# Patient Record
Sex: Female | Born: 1950
Health system: Southern US, Community
[De-identification: ages and names within clinical notes are randomized; demographics above are authoritative.]

## PROBLEM LIST (undated history)

## (undated) DIAGNOSIS — M1990 Unspecified osteoarthritis, unspecified site: Secondary | ICD-10-CM

## (undated) DIAGNOSIS — Z860100 Personal history of colon polyps, unspecified: Secondary | ICD-10-CM

## (undated) DIAGNOSIS — D649 Anemia, unspecified: Secondary | ICD-10-CM

## (undated) DIAGNOSIS — I10 Essential (primary) hypertension: Secondary | ICD-10-CM

## (undated) HISTORY — DX: Personal history of colon polyps, unspecified: Z86.0100

## (undated) HISTORY — DX: Essential (primary) hypertension: I10

## (undated) HISTORY — DX: Personal history of colonic polyps: Z86.010

## (undated) HISTORY — DX: Anemia, unspecified: D64.9

---

## 1974-01-21 HISTORY — PX: TUBAL LIGATION: SHX77

## 1987-01-22 HISTORY — PX: CHOLECYSTECTOMY: SHX55

## 2003-03-21 HISTORY — PX: GASTRIC BYPASS: SHX52

## 2003-10-28 ENCOUNTER — Emergency Department: Payer: Self-pay | Admitting: Emergency Medicine

## 2004-02-28 ENCOUNTER — Ambulatory Visit: Payer: Self-pay | Admitting: Internal Medicine

## 2005-03-05 ENCOUNTER — Ambulatory Visit: Payer: Self-pay | Admitting: Internal Medicine

## 2005-05-08 ENCOUNTER — Emergency Department: Payer: Self-pay | Admitting: Emergency Medicine

## 2005-07-04 ENCOUNTER — Ambulatory Visit: Payer: Self-pay | Admitting: Internal Medicine

## 2006-05-15 ENCOUNTER — Ambulatory Visit: Payer: Self-pay | Admitting: Gastroenterology

## 2006-06-03 ENCOUNTER — Ambulatory Visit: Payer: Self-pay | Admitting: Internal Medicine

## 2006-06-26 ENCOUNTER — Emergency Department: Payer: Self-pay | Admitting: Emergency Medicine

## 2006-09-01 ENCOUNTER — Ambulatory Visit: Payer: Self-pay | Admitting: Gastroenterology

## 2007-08-04 ENCOUNTER — Ambulatory Visit: Payer: Self-pay | Admitting: Internal Medicine

## 2007-10-22 ENCOUNTER — Ambulatory Visit: Payer: Self-pay | Admitting: Internal Medicine

## 2007-10-30 ENCOUNTER — Ambulatory Visit: Payer: Self-pay | Admitting: Internal Medicine

## 2007-11-22 ENCOUNTER — Ambulatory Visit: Payer: Self-pay | Admitting: Internal Medicine

## 2007-12-22 ENCOUNTER — Ambulatory Visit: Payer: Self-pay | Admitting: Internal Medicine

## 2008-01-22 ENCOUNTER — Ambulatory Visit: Payer: Self-pay | Admitting: Internal Medicine

## 2008-02-22 ENCOUNTER — Ambulatory Visit: Payer: Self-pay | Admitting: Internal Medicine

## 2008-03-21 ENCOUNTER — Ambulatory Visit: Payer: Self-pay | Admitting: Internal Medicine

## 2008-04-12 ENCOUNTER — Ambulatory Visit: Payer: Self-pay | Admitting: Internal Medicine

## 2008-04-21 ENCOUNTER — Ambulatory Visit: Payer: Self-pay | Admitting: Internal Medicine

## 2009-01-17 ENCOUNTER — Ambulatory Visit: Payer: Self-pay | Admitting: Internal Medicine

## 2010-04-25 ENCOUNTER — Ambulatory Visit: Payer: Self-pay | Admitting: Internal Medicine

## 2010-07-19 ENCOUNTER — Emergency Department: Payer: Self-pay | Admitting: Unknown Physician Specialty

## 2010-12-17 ENCOUNTER — Emergency Department: Payer: Self-pay | Admitting: Emergency Medicine

## 2011-06-28 ENCOUNTER — Ambulatory Visit: Payer: Self-pay | Admitting: Gastroenterology

## 2011-06-28 LAB — HM COLONOSCOPY

## 2011-07-15 LAB — HM PAP SMEAR: HM Pap smear: NEGATIVE

## 2011-08-01 ENCOUNTER — Ambulatory Visit: Payer: Self-pay | Admitting: Internal Medicine

## 2011-08-09 ENCOUNTER — Ambulatory Visit: Payer: Self-pay | Admitting: Internal Medicine

## 2011-08-09 LAB — HM MAMMOGRAPHY

## 2012-04-01 ENCOUNTER — Encounter: Payer: Self-pay | Admitting: *Deleted

## 2012-04-06 ENCOUNTER — Encounter: Payer: Self-pay | Admitting: Internal Medicine

## 2012-04-06 ENCOUNTER — Ambulatory Visit (INDEPENDENT_AMBULATORY_CARE_PROVIDER_SITE_OTHER): Payer: 59 | Admitting: Internal Medicine

## 2012-04-06 VITALS — BP 140/90 | HR 79 | Temp 97.8°F | Ht 62.16 in | Wt 223.8 lb

## 2012-04-06 DIAGNOSIS — Z860100 Personal history of colon polyps, unspecified: Secondary | ICD-10-CM

## 2012-04-06 DIAGNOSIS — N760 Acute vaginitis: Secondary | ICD-10-CM

## 2012-04-06 DIAGNOSIS — D649 Anemia, unspecified: Secondary | ICD-10-CM

## 2012-04-06 DIAGNOSIS — L0291 Cutaneous abscess, unspecified: Secondary | ICD-10-CM

## 2012-04-06 DIAGNOSIS — I10 Essential (primary) hypertension: Secondary | ICD-10-CM

## 2012-04-06 DIAGNOSIS — R748 Abnormal levels of other serum enzymes: Secondary | ICD-10-CM

## 2012-04-06 LAB — HEPATIC FUNCTION PANEL
ALT: 15 U/L (ref 0–35)
AST: 19 U/L (ref 0–37)
Albumin: 3.7 g/dL (ref 3.5–5.2)
Alkaline Phosphatase: 181 U/L — ABNORMAL HIGH (ref 39–117)
Bilirubin, Direct: 0 mg/dL (ref 0.0–0.3)
Total Bilirubin: 0.3 mg/dL (ref 0.3–1.2)
Total Protein: 7.8 g/dL (ref 6.0–8.3)

## 2012-04-06 LAB — BASIC METABOLIC PANEL
BUN: 9 mg/dL (ref 6–23)
CO2: 24 mEq/L (ref 19–32)
Calcium: 7.9 mg/dL — ABNORMAL LOW (ref 8.4–10.5)
Chloride: 103 mEq/L (ref 96–112)
Creatinine, Ser: 0.6 mg/dL (ref 0.4–1.2)
GFR: 121.79 mL/min (ref >60.00)
Glucose, Bld: 99 mg/dL (ref 70–99)
Potassium: 3.4 mEq/L — ABNORMAL LOW (ref 3.5–5.1)
Sodium: 135 mEq/L (ref 135–145)

## 2012-04-06 LAB — CBC WITH DIFFERENTIAL/PLATELET
Basophils Absolute: 0 10*3/uL (ref 0.0–0.1)
Basophils Relative: 0.4 % (ref 0.0–3.0)
Eosinophils Absolute: 0.1 10*3/uL (ref 0.0–0.7)
Eosinophils Relative: 1.8 % (ref 0.0–5.0)
HCT: 36.5 % (ref 36.0–46.0)
Hemoglobin: 11.9 g/dL — ABNORMAL LOW (ref 12.0–15.0)
Lymphocytes Relative: 39.6 % (ref 12.0–46.0)
Lymphs Abs: 2.4 10*3/uL (ref 0.7–4.0)
MCHC: 32.6 g/dL (ref 30.0–36.0)
MCV: 88.6 fl (ref 78.0–100.0)
Monocytes Absolute: 0.3 10*3/uL (ref 0.1–1.0)
Monocytes Relative: 5.5 % (ref 3.0–12.0)
Neutro Abs: 3.2 10*3/uL (ref 1.4–7.7)
Neutrophils Relative %: 52.7 % (ref 43.0–77.0)
Platelets: 215 10*3/uL (ref 150.0–400.0)
RBC: 4.11 Mil/uL (ref 3.87–5.11)
RDW: 15.1 % — ABNORMAL HIGH (ref 11.5–14.6)
WBC: 6.1 10*3/uL (ref 4.5–10.5)

## 2012-04-06 LAB — FERRITIN: Ferritin: 6 ng/mL — ABNORMAL LOW (ref 10.0–291.0)

## 2012-04-07 ENCOUNTER — Encounter: Payer: Self-pay | Admitting: Internal Medicine

## 2012-04-07 DIAGNOSIS — I10 Essential (primary) hypertension: Secondary | ICD-10-CM | POA: Insufficient documentation

## 2012-04-07 DIAGNOSIS — R748 Abnormal levels of other serum enzymes: Secondary | ICD-10-CM | POA: Insufficient documentation

## 2012-04-07 DIAGNOSIS — D649 Anemia, unspecified: Secondary | ICD-10-CM | POA: Insufficient documentation

## 2012-04-07 DIAGNOSIS — Z860100 Personal history of colon polyps, unspecified: Secondary | ICD-10-CM | POA: Insufficient documentation

## 2012-04-07 NOTE — Assessment & Plan Note (Signed)
Blood pressure as outlined.  Discussed with her regarding the need to take her medicine regularly.  Check metabolic panel.

## 2012-04-07 NOTE — Assessment & Plan Note (Signed)
Follow up colonosocpy 6/13 - internal hemorrhoids.    

## 2012-04-07 NOTE — Assessment & Plan Note (Signed)
History of iron deficient anemia.  Colonoscopy as outlined.  Recheck cbc.

## 2012-04-07 NOTE — Assessment & Plan Note (Signed)
Has been fractionated previously.  Higher percent bone.  Had bone scan.  Obtain record.  Recheck alkaline phosphatase.

## 2012-04-07 NOTE — Progress Notes (Signed)
  Subjective:    [name], female    DOB: 10/14/1950, 62 y.o.   MRN: [id number]  HPI 62 year old female with past history of hypertension and anemia who comes in today for a scheduled follow up and to transfer her care here to [facility].  She states she is doing well.  No chest pain or tightness.  Breathing stable.  No acid reflux, nausea or vomiting.  Has not been taking the HCTZ regularly.  May average taking 2-3x/week.  States her blood pressure averages 120-140/70-80s.  Has had issues with an abscess tooth.  Resolved now.  Plans to follow up with her dentist.  She also has had two "boils" on her left side.  Better now.  Have been draining.  No fever or chills.  Bowels stable.    Past Medical History  Diagnosis Date  . Hypertension   . Anemia   . History of colon polyps    Current Outpatient Prescriptions on File Prior to Visit  Medication Sig Dispense Refill  . hydrochlorothiazide (MICROZIDE) 12.5 MG capsule Take 12.5 mg by mouth daily.       No current facility-administered medications on file prior to visit.    Review of Systems Patient denies any headache, lightheadedness or dizziness.  No sinus or allergy symptoms.  No chest pain, tightness or palpitations.  No increased shortness of breath, cough or congestion.  No nausea or vomiting.  No acid reflux.  No abdominal pain or cramping.  No bowel change, such as diarrhea, constipation, BRBPR or melana.  No urine change.  "Boils" as outlined.  Draining. Improved.  Did report vaginal odor.       Objective:   Physical Exam Filed Vitals:   04/06/12 0915  BP: 140/90  Pulse: 79  Temp: 97.8 F (36.6 C)   Blood pressure recheck:  46/36  62 year old female in no acute distress.   HEENT:  Nares- clear.  Oropharynx - without lesions. NECK:  Supple.  Nontender.  No audible bruit.  HEART:  Appears to be regular. LUNGS:  No crackles or wheezing audible.  Respirations even and unlabored.  RADIAL PULSE:  Equal bilaterally.    LEFT SIDE:  Palpable, open - cystic lesion with surrounding firm area left side.  No erythema.  Previously drained.  Unable to express any material from the opening.  (improved per pt).  No significant tenderness to palpation.   ABDOMEN:  Soft, nontender.  Bowel sounds present and normal.  No audible abdominal bruit.  GU:  Normal external genitalia.  Vaginal vault without lesions.  Discharge present.  Sent off wet prep.  Could not appreciate any adnexal masses or tenderness.   EXTREMITIES:  No increased edema present.  DP pulses palpable and equal bilaterally.          Assessment & Plan:  VAGINITIS.  Wet prep sent.    "BOIL".  Exam as outlined.  Has previously been draining.  No erythema.  Still with firm tissue surrounding.  Will refer to surgery for evaluation and further treatment.    PULMONARY.  Has a history of reduced DLCO.  Has declined further w/up.   GYN.  Previous pap ASCUS wit negative HPV.  Referred to Dr Greggory Keen.  Cervical biopsy 05/01/10 - benign cyst.  Pap 07/15/11 - negative.    HEALTH MAINTENANCE.  Physical 07/15/11.  Colonoscopy 07/08/11 - internal hemorrhoids.  Mammogram 08/09/11 - Birads II.

## 2012-04-08 LAB — WET PREP BY MOLECULAR PROBE
Candida species: NEGATIVE
Gardnerella vaginalis: POSITIVE — AB
Trichomonas vaginosis: NEGATIVE

## 2012-04-13 ENCOUNTER — Ambulatory Visit (INDEPENDENT_AMBULATORY_CARE_PROVIDER_SITE_OTHER): Payer: 59 | Admitting: General Surgery

## 2012-04-13 ENCOUNTER — Encounter: Payer: Self-pay | Admitting: General Surgery

## 2012-04-13 VITALS — BP 134/64 | HR 74 | Temp 96.7°F | Resp 16 | Ht 64.0 in | Wt 225.0 lb

## 2012-04-13 DIAGNOSIS — L0291 Cutaneous abscess, unspecified: Secondary | ICD-10-CM

## 2012-04-13 DIAGNOSIS — L0390 Cellulitis, unspecified: Secondary | ICD-10-CM

## 2012-04-13 DIAGNOSIS — Z833 Family history of diabetes mellitus: Secondary | ICD-10-CM

## 2012-04-13 LAB — GLUCOSE, POCT (MANUAL RESULT ENTRY): POC Glucose: 101 mg/dl — AB (ref 70–99)

## 2012-04-13 NOTE — Progress Notes (Signed)
[name], female   DOB: 1950/07/26, 62 y.o.   MRN: [id number]  Chief Complaint  Patient presents with  . Other    abscess    HPI HPI Abscess Patient presents for evaluation of an left flank abscess . Lesion is located on the left side. Onset was 3 weeks ago. Symptoms have improved, beginning 1 week ago. Abscess has associated symptoms of pain. Patient do not have previous history of cutaneous abscesses. Patient does not have diabetes. Of note, the patient reported that the left flank abscess occurred at the same time she had a dental abscess around the maxillary incisor on the right side. This has since resolved.    Past Medical History  Diagnosis Date  . Hypertension   . Anemia   . History of colon polyps     Past Surgical History  Procedure Laterality Date  . Tubal ligation  1976  . Cholecystectomy  1989  . Gastric bypass  03/21/03    Family History  Problem Relation Age of Onset  . Hypertension Mother   . Diabetes Mother   . Cancer Maternal Aunt     question of type  . Cancer Maternal Grandmother     colon    Social History History  Substance Use Topics  . Smoking status: Never Smoker   . Smokeless tobacco: Never Used  . Alcohol Use: No    No Known Allergies  Current Outpatient Prescriptions  Medication Sig Dispense Refill  . hydrochlorothiazide (MICROZIDE) 12.5 MG capsule Take 12.5 mg by mouth daily.       No current facility-administered medications for this visit.    Review of Systems Review of Systems  Constitutional: Negative.   Respiratory: Negative.   Cardiovascular: Negative.     Blood pressure 134/64, pulse 74, temperature 96.7 F (35.9 C), resp. rate 16, height 5\' 4"  (1.626 m), weight 225 lb (102.059 kg), last menstrual period 04/06/2009. Blood pressure is improved from that obtained on 04/06/2012 at the time of her evaluation with Dale Beaverdam, M.D.  Physical Exam Physical Exam  Constitutional: She appears  well-developed and well-nourished.  Neck: Normal range of motion.  Cardiovascular: Normal rate, regular rhythm and normal heart sounds.   Pulmonary/Chest: Breath sounds normal.  Musculoskeletal:       Arms: Use a silver nitrate on the left flank healing abscess site.    Data Reviewed Finger stick blood sugar, nonfasting less than 110.  Assessment    Cutaneous abscess, resolving.    Plan    The patient will continue to keep the area covered until fully healed. She should call should she develop increasing swelling, drainage or induration.       Earline Mayotte 04/13/2012, 8:26 PM

## 2012-04-16 ENCOUNTER — Telehealth: Payer: Self-pay | Admitting: Internal Medicine

## 2012-04-16 NOTE — Telephone Encounter (Signed)
Lab results not in yet.

## 2012-04-16 NOTE — Telephone Encounter (Signed)
Patient wanting lab result.

## 2012-04-18 ENCOUNTER — Telehealth: Payer: Self-pay | Admitting: Internal Medicine

## 2012-04-18 DIAGNOSIS — D649 Anemia, unspecified: Secondary | ICD-10-CM

## 2012-04-18 DIAGNOSIS — E876 Hypokalemia: Secondary | ICD-10-CM

## 2012-04-18 MED ORDER — METRONIDAZOLE 0.75 % VA GEL: 1.0000 | Freq: Two times a day (BID) | VAGINAL | Status: DC

## 2012-04-18 NOTE — Telephone Encounter (Signed)
Pt notified of labs and need for follow up labs.  She is aware of appt - 05/04/12 11:30.  Please put on lab schedule.  Thanks.

## 2012-04-20 ENCOUNTER — Encounter: Payer: Self-pay | Admitting: Internal Medicine

## 2012-04-20 NOTE — Telephone Encounter (Signed)
Results given to patient

## 2012-04-20 NOTE — Telephone Encounter (Signed)
Appointment made

## 2012-05-04 ENCOUNTER — Other Ambulatory Visit: Payer: 59

## 2012-05-04 ENCOUNTER — Other Ambulatory Visit: Payer: Self-pay | Admitting: Internal Medicine

## 2012-05-04 ENCOUNTER — Other Ambulatory Visit (INDEPENDENT_AMBULATORY_CARE_PROVIDER_SITE_OTHER): Payer: 59

## 2012-05-04 DIAGNOSIS — E876 Hypokalemia: Secondary | ICD-10-CM

## 2012-05-04 DIAGNOSIS — D649 Anemia, unspecified: Secondary | ICD-10-CM

## 2012-05-04 LAB — CBC WITH DIFFERENTIAL/PLATELET
Basophils Absolute: 0 10*3/uL (ref 0.0–0.1)
Basophils Relative: 0.5 % (ref 0.0–3.0)
Eosinophils Absolute: 0.1 10*3/uL (ref 0.0–0.7)
Eosinophils Relative: 2.3 % (ref 0.0–5.0)
HCT: 34.9 % — ABNORMAL LOW (ref 36.0–46.0)
Hemoglobin: 11.4 g/dL — ABNORMAL LOW (ref 12.0–15.0)
Lymphocytes Relative: 38 % (ref 12.0–46.0)
Lymphs Abs: 2.2 10*3/uL (ref 0.7–4.0)
MCHC: 32.8 g/dL (ref 30.0–36.0)
MCV: 88 fl (ref 78.0–100.0)
Monocytes Absolute: 0.3 10*3/uL (ref 0.1–1.0)
Monocytes Relative: 5.8 % (ref 3.0–12.0)
Neutro Abs: 3.1 10*3/uL (ref 1.4–7.7)
Neutrophils Relative %: 53.4 % (ref 43.0–77.0)
Platelets: 202 10*3/uL (ref 150.0–400.0)
RBC: 3.96 Mil/uL (ref 3.87–5.11)
RDW: 14.4 % (ref 11.5–14.6)
WBC: 5.9 10*3/uL (ref 4.5–10.5)

## 2012-05-04 LAB — POTASSIUM: Potassium: 3.5 mEq/L (ref 3.5–5.1)

## 2012-05-04 NOTE — Progress Notes (Signed)
Orders placed for follow up labs 

## 2012-06-03 ENCOUNTER — Encounter: Payer: Self-pay | Admitting: Internal Medicine

## 2012-06-11 ENCOUNTER — Other Ambulatory Visit: Payer: 59

## 2012-07-07 ENCOUNTER — Encounter: Payer: Self-pay | Admitting: Internal Medicine

## 2012-08-10 ENCOUNTER — Encounter: Payer: Self-pay | Admitting: Internal Medicine

## 2012-09-23 ENCOUNTER — Ambulatory Visit: Payer: Self-pay | Admitting: Internal Medicine

## 2012-09-23 LAB — HM MAMMOGRAPHY

## 2012-11-02 ENCOUNTER — Encounter: Payer: Self-pay | Admitting: Internal Medicine

## 2012-11-03 ENCOUNTER — Encounter: Payer: Self-pay | Admitting: Internal Medicine

## 2012-12-22 ENCOUNTER — Telehealth: Payer: Self-pay | Admitting: *Deleted

## 2012-12-22 ENCOUNTER — Encounter: Payer: Self-pay | Admitting: Internal Medicine

## 2012-12-22 ENCOUNTER — Ambulatory Visit (INDEPENDENT_AMBULATORY_CARE_PROVIDER_SITE_OTHER): Payer: 59 | Admitting: Internal Medicine

## 2012-12-22 ENCOUNTER — Other Ambulatory Visit: Payer: Self-pay | Admitting: Internal Medicine

## 2012-12-22 VITALS — BP 142/90 | HR 73 | Temp 97.7°F | Ht 64.0 in | Wt 226.2 lb

## 2012-12-22 DIAGNOSIS — Z860100 Personal history of colon polyps, unspecified: Secondary | ICD-10-CM

## 2012-12-22 DIAGNOSIS — Z13220 Encounter for screening for lipoid disorders: Secondary | ICD-10-CM

## 2012-12-22 DIAGNOSIS — N6459 Other signs and symptoms in breast: Secondary | ICD-10-CM

## 2012-12-22 DIAGNOSIS — D649 Anemia, unspecified: Secondary | ICD-10-CM

## 2012-12-22 DIAGNOSIS — R748 Abnormal levels of other serum enzymes: Secondary | ICD-10-CM

## 2012-12-22 DIAGNOSIS — I10 Essential (primary) hypertension: Secondary | ICD-10-CM

## 2012-12-22 DIAGNOSIS — N6452 Nipple discharge: Secondary | ICD-10-CM

## 2012-12-22 LAB — CBC WITH DIFFERENTIAL/PLATELET
Basophils Absolute: 0 10*3/uL (ref 0.0–0.1)
Basophils Relative: 0.3 % (ref 0.0–3.0)
Eosinophils Absolute: 0.1 10*3/uL (ref 0.0–0.7)
Eosinophils Relative: 1.7 % (ref 0.0–5.0)
HCT: 37.5 % (ref 36.0–46.0)
Hemoglobin: 12.3 g/dL (ref 12.0–15.0)
Lymphocytes Relative: 36.8 % (ref 12.0–46.0)
Lymphs Abs: 2.5 10*3/uL (ref 0.7–4.0)
MCHC: 32.7 g/dL (ref 30.0–36.0)
MCV: 87.1 fl (ref 78.0–100.0)
Monocytes Absolute: 0.4 10*3/uL (ref 0.1–1.0)
Monocytes Relative: 6.2 % (ref 3.0–12.0)
Neutro Abs: 3.8 10*3/uL (ref 1.4–7.7)
Neutrophils Relative %: 55 % (ref 43.0–77.0)
Platelets: 202 10*3/uL (ref 150.0–400.0)
RBC: 4.31 Mil/uL (ref 3.87–5.11)
RDW: 14.9 % — ABNORMAL HIGH (ref 11.5–14.6)
WBC: 6.9 10*3/uL (ref 4.5–10.5)

## 2012-12-22 LAB — FERRITIN: Ferritin: 5.4 ng/mL — ABNORMAL LOW (ref 10.0–291.0)

## 2012-12-22 LAB — HEPATIC FUNCTION PANEL
ALT: 16 U/L (ref 0–35)
AST: 20 U/L (ref 0–37)
Albumin: 3.9 g/dL (ref 3.5–5.2)
Alkaline Phosphatase: 179 U/L — ABNORMAL HIGH (ref 39–117)
Bilirubin, Direct: 0.1 mg/dL (ref 0.0–0.3)
Total Bilirubin: 0.6 mg/dL (ref 0.3–1.2)
Total Protein: 7.4 g/dL (ref 6.0–8.3)

## 2012-12-22 LAB — IBC PANEL
Iron: 24 ug/dL — ABNORMAL LOW (ref 42–145)
Saturation Ratios: 4.9 % — ABNORMAL LOW (ref 20.0–50.0)
Transferrin: 352.5 mg/dL (ref 212.0–360.0)

## 2012-12-22 LAB — BASIC METABOLIC PANEL
BUN: 10 mg/dL (ref 6–23)
CO2: 24 mEq/L (ref 19–32)
Calcium: 9 mg/dL (ref 8.4–10.5)
Chloride: 106 mEq/L (ref 96–112)
Creatinine, Ser: 0.6 mg/dL (ref 0.4–1.2)
GFR: 123.73 mL/min (ref >60.00)
Glucose, Bld: 92 mg/dL (ref 70–99)
Potassium: 3.9 mEq/L (ref 3.5–5.1)
Sodium: 138 mEq/L (ref 135–145)

## 2012-12-22 LAB — LIPID PANEL
Cholesterol: 93 mg/dL (ref 0–200)
HDL: 43.1 mg/dL (ref >39.00)
LDL Cholesterol: 41 mg/dL (ref 0–99)
Total CHOL/HDL Ratio: 2
Triglycerides: 45 mg/dL (ref 0.0–149.0)
VLDL: 9 mg/dL (ref 0.0–40.0)

## 2012-12-22 LAB — TSH: TSH: 1.45 u[IU]/mL (ref 0.35–5.50)

## 2012-12-22 LAB — VITAMIN B12: Vitamin B-12: 162 pg/mL — ABNORMAL LOW (ref 211–911)

## 2012-12-22 MED ORDER — HYDROCHLOROTHIAZIDE 12.5 MG PO CAPS: 12.5000 mg | ORAL_CAPSULE | Freq: Every day | ORAL | Status: DC

## 2012-12-22 NOTE — Progress Notes (Signed)
Pre-visit discussion using our clinic review tool. No additional management support is needed unless otherwise documented below in the visit note.  

## 2012-12-22 NOTE — Telephone Encounter (Signed)
error 

## 2012-12-22 NOTE — Progress Notes (Signed)
Orders placed for labs

## 2012-12-22 NOTE — Progress Notes (Signed)
  Subjective:    [name], female    DOB: [birth year], 62 y.o.   MRN: [id number]  HPI 62 year old female with past history of hypertension and anemia who comes in today for a scheduled follow up.  She states she is doing well.  No chest pain or tightness.  Breathing stable.  No acid reflux, nausea or vomiting.  Has not been taking the HCTZ regularly.  Is completely off the medication.  States her blood pressures are averaging 140-150.  No cardiac symptoms with increased activity or exertion.  Breathing stable.  Bowels stable.  She does report she has noticed some milky discharge from her left breast.  First noticed last week.  No pain.  No nipple inversion.      Past Medical History  Diagnosis Date  . Hypertension   . Anemia   . History of colon polyps    Current Outpatient Prescriptions on File Prior to Visit  Medication Sig Dispense Refill  . hydrochlorothiazide (MICROZIDE) 12.5 MG capsule Take 12.5 mg by mouth daily.       No current facility-administered medications on file prior to visit.    Review of Systems Patient denies any headache, lightheadedness or dizziness.  No sinus or allergy symptoms.  No chest pain, tightness or palpitations.  No increased shortness of breath, cough or congestion.  No nausea or vomiting.  No acid reflux.  No abdominal pain or cramping.  No bowel change, such as diarrhea, constipation, BRBPR or melana.  No urine change.  Blood pressure as outlined.  Milky discharge as outlined.       Objective:   Physical Exam  Filed Vitals:   12/22/12 0912  BP: 142/90  Pulse: 73  Temp: 97.7 F (36.5 C)   Blood pressure recheck:  136/88, pulse 64  62 year old female in no acute distress.   HEENT:  Nares- clear.  Oropharynx - without lesions. NECK:  Supple.  Nontender.  No audible bruit.  HEART:  Appears to be regular. LUNGS:  No crackles or wheezing audible.  Respirations even and unlabored.  RADIAL PULSE:  Equal bilaterally.    BREASTS:  No  nipple retraction present.  Could not appreciate any distinct nodules or axillary adenopathy.  Able to express milky discharge from the left breast.  No discharge from the right breast.   ABDOMEN:  Soft, nontender.  Bowel sounds present and normal.  No audible abdominal bruit.   EXTREMITIES:  No increased edema present.  DP pulses palpable and equal bilaterally.          Assessment & Plan:  PULMONARY.  Has a history of reduced DLCO.  Has declined further w/up.   GYN.  Previous pap ASCUS wit negative HPV.  Referred to Dr Greggory Keen.  Cervical biopsy 05/01/10 - benign cyst.  Pap 07/15/11 - negative.    HEALTH MAINTENANCE.  Physical 07/15/11.  Colonoscopy 07/08/11 - internal hemorrhoids.  Mammogram 09/23/12 negative. Overdue physical.  Schedule for next visit.

## 2012-12-23 LAB — PROLACTIN: Prolactin: 5 ng/mL

## 2012-12-25 LAB — WOUND CULTURE
Gram Stain: NONE SEEN
Gram Stain: NONE SEEN
Organism ID, Bacteria: NORMAL

## 2012-12-26 ENCOUNTER — Encounter: Payer: Self-pay | Admitting: Internal Medicine

## 2012-12-26 DIAGNOSIS — N6452 Nipple discharge: Secondary | ICD-10-CM | POA: Insufficient documentation

## 2012-12-26 NOTE — Assessment & Plan Note (Signed)
History of iron deficient anemia.  Colonoscopy as outlined.  Recheck cbc.

## 2012-12-26 NOTE — Assessment & Plan Note (Signed)
Has noticed a milky discharge from the left breast.  Culture obtained.  Check prolactin level.  Further w/up pending.

## 2012-12-26 NOTE — Assessment & Plan Note (Signed)
Follow up colonosocpy 6/13 - internal hemorrhoids.    

## 2012-12-26 NOTE — Assessment & Plan Note (Signed)
Blood pressure as outlined.  Discussed with her regarding the need to take her medicine regularly.  Check metabolic panel.  Restart HCTZ 12.5mg  q day.  Follow.

## 2012-12-26 NOTE — Assessment & Plan Note (Signed)
Has been fractionated previously.  Higher percent bone.  Had bone scan.  Obtain record.  Recheck alkaline phosphatase.

## 2012-12-29 ENCOUNTER — Other Ambulatory Visit: Payer: Self-pay | Admitting: Internal Medicine

## 2012-12-29 DIAGNOSIS — R748 Abnormal levels of other serum enzymes: Secondary | ICD-10-CM

## 2012-12-29 NOTE — Progress Notes (Signed)
Order placed for f/u alkaline phosphatase 

## 2012-12-30 ENCOUNTER — Other Ambulatory Visit: Payer: Self-pay | Admitting: *Deleted

## 2012-12-30 ENCOUNTER — Other Ambulatory Visit: Payer: Self-pay | Admitting: Internal Medicine

## 2012-12-30 DIAGNOSIS — D509 Iron deficiency anemia, unspecified: Secondary | ICD-10-CM

## 2012-12-30 MED ORDER — FERROUS SULFATE 325 (65 FE) MG PO TABS: 325.0000 mg | ORAL_TABLET | Freq: Every day | ORAL | Status: DC

## 2012-12-30 NOTE — Progress Notes (Signed)
Referral made to GI for iron deficient anemia

## 2012-12-31 ENCOUNTER — Encounter: Payer: Self-pay | Admitting: *Deleted

## 2013-01-01 ENCOUNTER — Ambulatory Visit (INDEPENDENT_AMBULATORY_CARE_PROVIDER_SITE_OTHER): Payer: 59 | Admitting: *Deleted

## 2013-01-01 DIAGNOSIS — E538 Deficiency of other specified B group vitamins: Secondary | ICD-10-CM

## 2013-01-01 MED ORDER — CYANOCOBALAMIN 1000 MCG/ML IJ SOLN
1000.0000 ug | Freq: Once | INTRAMUSCULAR | Status: AC
  Administered 2013-01-01: 1000 ug via INTRAMUSCULAR

## 2013-01-07 ENCOUNTER — Encounter: Payer: Self-pay | Admitting: Emergency Medicine

## 2013-01-08 ENCOUNTER — Ambulatory Visit (INDEPENDENT_AMBULATORY_CARE_PROVIDER_SITE_OTHER): Payer: 59 | Admitting: *Deleted

## 2013-01-08 DIAGNOSIS — E538 Deficiency of other specified B group vitamins: Secondary | ICD-10-CM

## 2013-01-08 MED ORDER — CYANOCOBALAMIN 1000 MCG/ML IJ SOLN
1000.0000 ug | Freq: Once | INTRAMUSCULAR | Status: AC
  Administered 2013-01-08: 1000 ug via INTRAMUSCULAR

## 2013-01-18 ENCOUNTER — Other Ambulatory Visit: Payer: Self-pay | Admitting: Internal Medicine

## 2013-01-18 ENCOUNTER — Ambulatory Visit (INDEPENDENT_AMBULATORY_CARE_PROVIDER_SITE_OTHER): Payer: 59

## 2013-01-18 ENCOUNTER — Other Ambulatory Visit (INDEPENDENT_AMBULATORY_CARE_PROVIDER_SITE_OTHER): Payer: 59

## 2013-01-18 DIAGNOSIS — E538 Deficiency of other specified B group vitamins: Secondary | ICD-10-CM

## 2013-01-18 DIAGNOSIS — R748 Abnormal levels of other serum enzymes: Secondary | ICD-10-CM

## 2013-01-18 MED ORDER — CYANOCOBALAMIN 1000 MCG/ML IJ SOLN
1000.0000 ug | Freq: Once | INTRAMUSCULAR | Status: AC
  Administered 2013-01-18: 1000 ug via INTRAMUSCULAR

## 2013-01-25 LAB — ALKALINE PHOSPHATASE ISOENZYMES
Alkaline Phonsphatase: 190 U/L — ABNORMAL HIGH (ref 33–130)
Bone Isoenzymes: 81 % — ABNORMAL HIGH (ref 28–66)
Intestinal Isoenzymes: 0 % — ABNORMAL LOW (ref 1–24)
Liver Isoenzymes: 19 % — ABNORMAL LOW (ref 25–69)
Macrohepatic isoenzymes: 0 %

## 2013-01-27 ENCOUNTER — Encounter: Payer: Self-pay | Admitting: *Deleted

## 2013-02-18 ENCOUNTER — Ambulatory Visit: Payer: 59

## 2013-02-19 ENCOUNTER — Encounter: Payer: Self-pay | Admitting: Emergency Medicine

## 2013-02-24 ENCOUNTER — Encounter (INDEPENDENT_AMBULATORY_CARE_PROVIDER_SITE_OTHER): Payer: Self-pay

## 2013-02-24 ENCOUNTER — Ambulatory Visit (INDEPENDENT_AMBULATORY_CARE_PROVIDER_SITE_OTHER): Payer: 59 | Admitting: Internal Medicine

## 2013-02-24 ENCOUNTER — Encounter: Payer: Self-pay | Admitting: Internal Medicine

## 2013-02-24 ENCOUNTER — Other Ambulatory Visit (HOSPITAL_COMMUNITY)
Admission: RE | Admit: 2013-02-24 | Discharge: 2013-02-24 | Disposition: A | Discharge status: Home / Self Care | Payer: 59 | Source: Ambulatory Visit | Attending: Internal Medicine | Admitting: Internal Medicine

## 2013-02-24 VITALS — BP 140/96 | HR 74 | Temp 97.9°F | Ht 62.75 in | Wt 225.5 lb

## 2013-02-24 DIAGNOSIS — E611 Iron deficiency: Secondary | ICD-10-CM

## 2013-02-24 DIAGNOSIS — N6459 Other signs and symptoms in breast: Secondary | ICD-10-CM

## 2013-02-24 DIAGNOSIS — Z01419 Encounter for gynecological examination (general) (routine) without abnormal findings: Secondary | ICD-10-CM | POA: Insufficient documentation

## 2013-02-24 DIAGNOSIS — R748 Abnormal levels of other serum enzymes: Secondary | ICD-10-CM

## 2013-02-24 DIAGNOSIS — I10 Essential (primary) hypertension: Secondary | ICD-10-CM

## 2013-02-24 DIAGNOSIS — N6452 Nipple discharge: Secondary | ICD-10-CM

## 2013-02-24 DIAGNOSIS — D649 Anemia, unspecified: Secondary | ICD-10-CM

## 2013-02-24 DIAGNOSIS — Z86010 Personal history of colonic polyps: Secondary | ICD-10-CM

## 2013-02-24 DIAGNOSIS — D509 Iron deficiency anemia, unspecified: Secondary | ICD-10-CM

## 2013-02-24 DIAGNOSIS — Z1151 Encounter for screening for human papillomavirus (HPV): Secondary | ICD-10-CM | POA: Insufficient documentation

## 2013-02-24 DIAGNOSIS — Z124 Encounter for screening for malignant neoplasm of cervix: Secondary | ICD-10-CM

## 2013-02-24 MED ORDER — HYDROCHLOROTHIAZIDE 25 MG PO TABS: 25.0000 mg | ORAL_TABLET | Freq: Every day | ORAL | Status: DC

## 2013-02-24 NOTE — Patient Instructions (Signed)
Increase your hctz to 25mg  - one tablet per day.

## 2013-02-24 NOTE — Progress Notes (Signed)
  Subjective:    [name], female    DOB: 29-Oct-1950, 63 y.o.   MRN: [id number]  HPI 63 year old female with past history of hypertension and anemia who comes in today to follow up on these issues as well as for a complete physical exam.    She states she is doing well.  No chest pain or tightness.  Breathing stable.  No acid reflux, nausea or vomiting.  Back on the HCTZ regularly now.   No cardiac symptoms with increased activity or exertion.  Breathing stable.  Bowels stable.        Past Medical History  Diagnosis Date  . Hypertension   . Anemia   . History of colon polyps     Current Outpatient Prescriptions on File Prior to Visit  Medication Sig Dispense Refill  . ferrous sulfate 325 (65 FE) MG tablet Take 1 tablet (325 mg total) by mouth daily with breakfast.  30 tablet  5  . hydrochlorothiazide (MICROZIDE) 12.5 MG capsule Take 1 capsule (12.5 mg total) by mouth daily.  30 capsule  1   No current facility-administered medications on file prior to visit.    Review of Systems Patient denies any headache, lightheadedness or dizziness.  No sinus or allergy symptoms.  No chest pain, tightness or palpitations.  No increased shortness of breath, cough or congestion.  No nausea or vomiting.  No acid reflux.  No abdominal pain or cramping.  No bowel change, such as diarrhea, constipation, BRBPR or melana.  No urine change.  Back on her blood pressure medication regularly.        Objective:   Physical Exam  Filed Vitals:   02/24/13 1115  BP: 140/96  Pulse: 74  Temp: 97.9 F (36.6 C)   Blood pressure recheck:  53/43-65  63 year old female in no acute distress.   HEENT:  Nares- clear.  Oropharynx - without lesions. NECK:  Supple.  Nontender.  No audible bruit.  HEART:  Appears to be regular. LUNGS:  No crackles or wheezing audible.  Respirations even and unlabored.  RADIAL PULSE:  Equal bilaterally.    BREASTS:  No nipple discharge or nipple retraction present.   Could not appreciate any distinct nodules or axillary adenopathy.  ABDOMEN:  Soft, nontender.  Bowel sounds present and normal.  No audible abdominal bruit.  GU:  Normal external genitalia.  Vaginal vault without lesions.  Cervix identified.  Pap performed. Could not appreciate any adnexal masses or tenderness.   RECTAL:  Heme negative.   EXTREMITIES:  No increased edema present.  DP pulses palpable and equal bilaterally.          Assessment & Plan:  PULMONARY.  Has a history of reduced DLCO.  Has declined further w/up.   GYN.  Previous pap ASCUS wit negative HPV.  Referred to Dr Enzo Bi.  Cervical biopsy 05/01/10 - benign cyst.  Pap 07/15/11 - negative.  Repeat pap today.    HEALTH MAINTENANCE.  Physical today.  Colonoscopy 07/08/11 - internal hemorrhoids.  Mammogram 09/23/12 negative.

## 2013-02-26 ENCOUNTER — Encounter: Payer: Self-pay | Admitting: *Deleted

## 2013-03-01 ENCOUNTER — Encounter: Payer: Self-pay | Admitting: Internal Medicine

## 2013-03-01 DIAGNOSIS — E611 Iron deficiency: Secondary | ICD-10-CM | POA: Insufficient documentation

## 2013-03-01 NOTE — Assessment & Plan Note (Signed)
Blood pressure as outlined.  Discussed with her regarding the need to take her medicine regularly.  States she is taking regularly now.  Will increase HCTZ to 25mg  q day.  Follow.

## 2013-03-01 NOTE — Assessment & Plan Note (Signed)
History of iron deficient anemia.  Colonoscopy as outlined.  Follow cbc.   

## 2013-03-01 NOTE — Assessment & Plan Note (Signed)
Follow up colonosocpy 6/13 - internal hemorrhoids.    

## 2013-03-01 NOTE — Assessment & Plan Note (Signed)
Refer back to GI for further w/up.

## 2013-03-01 NOTE — Assessment & Plan Note (Signed)
Has been fractionated.   Higher percent bone.  Had bone scan.  No acute abnormality to explain results.  Consider possible referral for further w/up.

## 2013-03-01 NOTE — Assessment & Plan Note (Signed)
Culture negative.  Prolactin wnl.  Mammogram normal.  Discussed referral to surgery.  She reports no further discharge and wanted to hold on referral.  Follow.

## 2013-03-22 ENCOUNTER — Ambulatory Visit: Payer: 59 | Admitting: Internal Medicine

## 2013-03-23 ENCOUNTER — Encounter: Payer: Self-pay | Admitting: Internal Medicine

## 2013-03-23 ENCOUNTER — Ambulatory Visit (INDEPENDENT_AMBULATORY_CARE_PROVIDER_SITE_OTHER): Payer: 59 | Admitting: Internal Medicine

## 2013-03-23 ENCOUNTER — Encounter: Payer: Self-pay | Admitting: *Deleted

## 2013-03-23 VITALS — BP 144/86 | HR 77 | Temp 97.9°F | Resp 16 | Ht 62.75 in | Wt 218.5 lb

## 2013-03-23 DIAGNOSIS — R748 Abnormal levels of other serum enzymes: Secondary | ICD-10-CM

## 2013-03-23 DIAGNOSIS — I10 Essential (primary) hypertension: Secondary | ICD-10-CM

## 2013-03-23 LAB — BASIC METABOLIC PANEL
BUN: 15 mg/dL (ref 6–23)
CO2: 27 mEq/L (ref 19–32)
Calcium: 8.4 mg/dL (ref 8.4–10.5)
Chloride: 103 mEq/L (ref 96–112)
Creatinine, Ser: 0.7 mg/dL (ref 0.4–1.2)
GFR: 113.19 mL/min (ref >60.00)
Glucose, Bld: 91 mg/dL (ref 70–99)
Potassium: 3.5 mEq/L (ref 3.5–5.1)
Sodium: 136 mEq/L (ref 135–145)

## 2013-03-23 NOTE — Progress Notes (Signed)
Pre visit review using our clinic review tool, if applicable. No additional management support is needed unless otherwise documented below in the visit note. 

## 2013-03-23 NOTE — Progress Notes (Signed)
  Subjective:    [name], female    DOB: Jun 18, 1950, 63 y.o.   MRN: [id number]  HPI 63 year old female with past history of hypertension and anemia who comes in today for a scheduled follow up.   She states she is doing well.  No chest pain or tightness.  Breathing stable.  No acid reflux, nausea or vomiting.  Back on the HCTZ regularly now.   No cardiac symptoms with increased activity or exertion.  Breathing stable.  Bowels stable.        Past Medical History  Diagnosis Date  . Hypertension   . Anemia   . History of colon polyps     Current Outpatient Prescriptions on File Prior to Visit  Medication Sig Dispense Refill  . ferrous sulfate 325 (65 FE) MG tablet Take 1 tablet (325 mg total) by mouth daily with breakfast.  30 tablet  5  . hydrochlorothiazide (HYDRODIURIL) 25 MG tablet Take 1 tablet (25 mg total) by mouth daily.  30 tablet  3   No current facility-administered medications on file prior to visit.    Review of Systems Patient denies any headache, lightheadedness or dizziness.  No sinus or allergy symptoms.  No chest pain, tightness or palpitations.  No increased shortness of breath, cough or congestion.  No nausea or vomiting.  No acid reflux.  No abdominal pain or cramping.  No bowel change, such as diarrhea, constipation, BRBPR or melana.  No urine change.  Back on her blood pressure medication regularly.  On the HCTZ (full 25mg  now).       Objective:   Physical Exam  Filed Vitals:   03/23/13 0810  BP: 144/86  Pulse: 77  Temp: 97.9 F (36.6 C)  Resp: 16   Blood pressure recheck:  136/84, recheck prior to leaving 76/57  63 year old female in no acute distress.   HEENT:  Nares- clear.  Oropharynx - without lesions. NECK:  Supple.  Nontender.  No audible bruit.  HEART:  Appears to be regular. LUNGS:  No crackles or wheezing audible.  Respirations even and unlabored.  RADIAL PULSE:  Equal bilaterally.  ABDOMEN:  Soft, nontender.  Bowel sounds  present and normal.  No audible abdominal bruit.    EXTREMITIES:  No increased edema present.  DP pulses palpable and equal bilaterally.          Assessment & Plan:  PULMONARY.  Has a history of reduced DLCO.  Has declined further w/up.   GYN.  Previous pap ASCUS wit negative HPV.  Referred to Dr Enzo Bi.  Cervical biopsy 05/01/10 - benign cyst.  Pap 07/15/11 - negative.  Repeat pap at physical negative with negative HPV.     HEALTH MAINTENANCE.  Physical 02/24/13.  Colonoscopy 07/08/11 - internal hemorrhoids.  Mammogram 09/23/12 negative.

## 2013-03-27 ENCOUNTER — Encounter: Payer: Self-pay | Admitting: Internal Medicine

## 2013-03-27 NOTE — Assessment & Plan Note (Signed)
Blood pressure as outlined.  On HCTZ 25mg q day.  Tolerating.  Blood pressure better.  Follow metabolic panel.    

## 2013-03-27 NOTE — Assessment & Plan Note (Signed)
Has been fractionated.   Higher percent bone.  Had bone scan.  No acute abnormality to explain results.  Consider possible referral for further w/up.       

## 2013-03-30 ENCOUNTER — Ambulatory Visit: Payer: 59 | Admitting: Internal Medicine

## 2013-05-25 ENCOUNTER — Ambulatory Visit (INDEPENDENT_AMBULATORY_CARE_PROVIDER_SITE_OTHER): Payer: 59 | Admitting: Internal Medicine

## 2013-05-25 ENCOUNTER — Encounter: Payer: Self-pay | Admitting: Internal Medicine

## 2013-05-25 VITALS — BP 118/80 | HR 82 | Temp 98.3°F | Ht 62.75 in | Wt 218.2 lb

## 2013-05-25 DIAGNOSIS — N6459 Other signs and symptoms in breast: Secondary | ICD-10-CM

## 2013-05-25 DIAGNOSIS — D649 Anemia, unspecified: Secondary | ICD-10-CM

## 2013-05-25 DIAGNOSIS — D509 Iron deficiency anemia, unspecified: Secondary | ICD-10-CM

## 2013-05-25 DIAGNOSIS — I10 Essential (primary) hypertension: Secondary | ICD-10-CM

## 2013-05-25 DIAGNOSIS — Z86010 Personal history of colonic polyps: Secondary | ICD-10-CM

## 2013-05-25 DIAGNOSIS — R748 Abnormal levels of other serum enzymes: Secondary | ICD-10-CM

## 2013-05-25 DIAGNOSIS — N6452 Nipple discharge: Secondary | ICD-10-CM

## 2013-05-25 DIAGNOSIS — E611 Iron deficiency: Secondary | ICD-10-CM

## 2013-05-25 LAB — CBC WITH DIFFERENTIAL/PLATELET
Basophils Absolute: 0 10*3/uL (ref 0.0–0.1)
Basophils Relative: 0.4 % (ref 0.0–3.0)
Eosinophils Absolute: 0.1 10*3/uL (ref 0.0–0.7)
Eosinophils Relative: 1.9 % (ref 0.0–5.0)
HCT: 37.9 % (ref 36.0–46.0)
Hemoglobin: 12.5 g/dL (ref 12.0–15.0)
Lymphocytes Relative: 37.8 % (ref 12.0–46.0)
Lymphs Abs: 2.4 10*3/uL (ref 0.7–4.0)
MCHC: 33.1 g/dL (ref 30.0–36.0)
MCV: 87.6 fl (ref 78.0–100.0)
Monocytes Absolute: 0.5 10*3/uL (ref 0.1–1.0)
Monocytes Relative: 7.7 % (ref 3.0–12.0)
Neutro Abs: 3.3 10*3/uL (ref 1.4–7.7)
Neutrophils Relative %: 52.2 % (ref 43.0–77.0)
Platelets: 200 10*3/uL (ref 150.0–400.0)
RBC: 4.32 Mil/uL (ref 3.87–5.11)
RDW: 15 % (ref 11.5–15.5)
WBC: 6.3 10*3/uL (ref 4.0–10.5)

## 2013-05-25 LAB — LIPID PANEL
Cholesterol: 91 mg/dL (ref 0–200)
HDL: 45.1 mg/dL (ref >39.00)
LDL Cholesterol: 34 mg/dL (ref 0–99)
Total CHOL/HDL Ratio: 2
Triglycerides: 59 mg/dL (ref 0.0–149.0)
VLDL: 11.8 mg/dL (ref 0.0–40.0)

## 2013-05-25 LAB — BASIC METABOLIC PANEL
BUN: 10 mg/dL (ref 6–23)
CO2: 28 mEq/L (ref 19–32)
Calcium: 8.3 mg/dL — ABNORMAL LOW (ref 8.4–10.5)
Chloride: 104 mEq/L (ref 96–112)
Creatinine, Ser: 0.7 mg/dL (ref 0.4–1.2)
GFR: 107.63 mL/min (ref >60.00)
Glucose, Bld: 91 mg/dL (ref 70–99)
Potassium: 3.5 mEq/L (ref 3.5–5.1)
Sodium: 138 mEq/L (ref 135–145)

## 2013-05-25 NOTE — Progress Notes (Signed)
Pre visit review using our clinic review tool, if applicable. No additional management support is needed unless otherwise documented below in the visit note. 

## 2013-05-26 ENCOUNTER — Other Ambulatory Visit: Payer: Self-pay | Admitting: Internal Medicine

## 2013-05-26 ENCOUNTER — Other Ambulatory Visit (INDEPENDENT_AMBULATORY_CARE_PROVIDER_SITE_OTHER): Payer: 59

## 2013-05-26 DIAGNOSIS — R748 Abnormal levels of other serum enzymes: Secondary | ICD-10-CM

## 2013-05-26 LAB — HEPATIC FUNCTION PANEL
ALT: 15 U/L (ref 0–35)
AST: 23 U/L (ref 0–37)
Albumin: 3.8 g/dL (ref 3.5–5.2)
Alkaline Phosphatase: 153 U/L — ABNORMAL HIGH (ref 39–117)
Bilirubin, Direct: 0.1 mg/dL (ref 0.0–0.3)
Total Bilirubin: 0.5 mg/dL (ref 0.2–1.2)
Total Protein: 7.5 g/dL (ref 6.0–8.3)

## 2013-05-26 NOTE — Progress Notes (Signed)
Order placed for f/u lab.   

## 2013-05-27 ENCOUNTER — Telehealth: Payer: Self-pay | Admitting: *Deleted

## 2013-05-27 NOTE — Telephone Encounter (Signed)
Pt left voicemail stating that she was returning a call (see lab results for more info)

## 2013-05-28 NOTE — Telephone Encounter (Signed)
Pt.notified

## 2013-05-28 NOTE — Telephone Encounter (Signed)
Notify pt that her cholesterol looks good.  Her kidney function, potassium and liver function are wnl.  Her alkaline phosphatase level is better.  She is aware we have been following.  Let her know that I spoke with hematology and they do not feel she needs any further w/up for this.  Labs ok.

## 2013-05-30 ENCOUNTER — Encounter: Payer: Self-pay | Admitting: Internal Medicine

## 2013-05-30 NOTE — Assessment & Plan Note (Signed)
History of iron deficient anemia.  Colonoscopy as outlined.  Follow cbc.   

## 2013-05-30 NOTE — Progress Notes (Signed)
  Subjective:    [name], female    DOB: 03-Mar-1950, 63 y.o.   MRN: [id number]  HPI 63 year old female with past history of hypertension and anemia who comes in today for a scheduled follow up.   She states she is doing well.  No chest pain or tightness.  Breathing stable.  No acid reflux, nausea or vomiting.  Back on the HCTZ regularly now.   No cardiac symptoms with increased activity or exertion.  Breathing stable.  Bowels stable.   We previously increased her HCTZ to one whole tablet per day.  States her blood pressure has been averaging 120-130/70-80s.  Still with some knee pain.  Bothers her more with walking.  Desires no further intervention at this time.      Past Medical History  Diagnosis Date  . Hypertension   . Anemia   . History of colon polyps     Current Outpatient Prescriptions on File Prior to Visit  Medication Sig Dispense Refill  . ferrous sulfate 325 (65 FE) MG tablet Take 1 tablet (325 mg total) by mouth daily with breakfast.  30 tablet  5  . hydrochlorothiazide (HYDRODIURIL) 25 MG tablet Take 1 tablet (25 mg total) by mouth daily.  30 tablet  3   No current facility-administered medications on file prior to visit.    Review of Systems Patient denies any headache, lightheadedness or dizziness.  No sinus or allergy symptoms.  No chest pain, tightness or palpitations.  No increased shortness of breath, cough or congestion.  No nausea or vomiting.  No acid reflux.  No abdominal pain or cramping.  No bowel change, such as diarrhea, constipation, BRBPR or melana.  No urine change.  Back on her blood pressure medication regularly.  On the HCTZ (full 25mg  now).  Blood pressure better.        Objective:   Physical Exam  Filed Vitals:   05/25/13 1355  BP: 118/80  Pulse: 82  Temp: 98.3 F (71.59 C)   63 year old female in no acute distress.   HEENT:  Nares- clear.  Oropharynx - without lesions. NECK:  Supple.  Nontender.  No audible bruit.  HEART:   Appears to be regular. LUNGS:  No crackles or wheezing audible.  Respirations even and unlabored.  RADIAL PULSE:  Equal bilaterally.  ABDOMEN:  Soft, nontender.  Bowel sounds present and normal.  No audible abdominal bruit.    EXTREMITIES:  No increased edema present.  DP pulses palpable and equal bilaterally.          Assessment & Plan:  PULMONARY.  Has a history of reduced DLCO.  Has declined further w/up.   GYN.  Previous pap ASCUS with negative HPV.  Referred to Dr Enzo Bi.  Cervical biopsy 05/01/10 - benign cyst.  Pap 07/15/11 - negative.  Repeat pap at physical (02/24/13) negative with negative HPV.     HEALTH MAINTENANCE.  Physical 02/24/13.  Colonoscopy 07/08/11 - internal hemorrhoids.  Mammogram 09/23/12 negative.

## 2013-05-30 NOTE — Assessment & Plan Note (Signed)
Follow up colonosocpy 6/13 - internal hemorrhoids.    

## 2013-05-30 NOTE — Assessment & Plan Note (Signed)
Has been referred back to GI for further w/up.

## 2013-05-30 NOTE — Assessment & Plan Note (Signed)
Has been fractionated.   Higher percent bone.  Had bone scan.  No acute abnormality to explain results.  Recheck today.

## 2013-05-30 NOTE — Assessment & Plan Note (Signed)
Blood pressure as outlined.  On HCTZ 25mg q day.  Tolerating.  Blood pressure better.  Follow metabolic panel.    

## 2013-05-30 NOTE — Assessment & Plan Note (Signed)
Culture negative.  Prolactin wnl.  Mammogram normal.  Discussed referral to surgery.  She reports no further discharge and wanted to hold on referral.  Follow.

## 2013-07-22 ENCOUNTER — Encounter: Payer: 59 | Admitting: Internal Medicine

## 2013-10-01 ENCOUNTER — Ambulatory Visit (INDEPENDENT_AMBULATORY_CARE_PROVIDER_SITE_OTHER): Payer: 59 | Admitting: Internal Medicine

## 2013-10-01 ENCOUNTER — Encounter: Payer: Self-pay | Admitting: Internal Medicine

## 2013-10-01 VITALS — BP 140/78 | HR 74 | Temp 98.1°F | Ht 62.75 in | Wt 220.8 lb

## 2013-10-01 DIAGNOSIS — R748 Abnormal levels of other serum enzymes: Secondary | ICD-10-CM

## 2013-10-01 DIAGNOSIS — Z860100 Personal history of colon polyps, unspecified: Secondary | ICD-10-CM

## 2013-10-01 DIAGNOSIS — I10 Essential (primary) hypertension: Secondary | ICD-10-CM

## 2013-10-01 DIAGNOSIS — D649 Anemia, unspecified: Secondary | ICD-10-CM

## 2013-10-01 DIAGNOSIS — Z1239 Encounter for other screening for malignant neoplasm of breast: Secondary | ICD-10-CM

## 2013-10-01 NOTE — Progress Notes (Signed)
Pre visit review using our clinic review tool, if applicable. No additional management support is needed unless otherwise documented below in the visit note. 

## 2013-10-02 ENCOUNTER — Encounter: Payer: Self-pay | Admitting: Internal Medicine

## 2013-10-02 NOTE — Assessment & Plan Note (Signed)
Follow up colonosocpy 6/13 - internal hemorrhoids.

## 2013-10-02 NOTE — Assessment & Plan Note (Signed)
Blood pressure as outlined.  On HCTZ 25mg  q day.  Tolerating.  Blood pressure better.  Follow metabolic panel.

## 2013-10-02 NOTE — Assessment & Plan Note (Signed)
History of iron deficient anemia.  Colonoscopy as outlined.  Follow cbc.

## 2013-10-02 NOTE — Assessment & Plan Note (Signed)
Has been fractionated.   Higher percent bone.  Had bone scan.  No acute abnormality to explain results.  Recheck today.  Discussed with hematology.  Recommended since stable, no further w/up.

## 2013-10-02 NOTE — Progress Notes (Signed)
  Subjective:    [name], female    DOB: [birth year], 63 y.o.   MRN: [id number]  HPI 63 year old female with past history of hypertension and anemia who comes in today for a scheduled follow up.   She states she is doing well.  No chest pain or tightness.  Breathing stable.  No acid reflux, nausea or vomiting.  Taking HCTZ daily.  Did not take this am.  No cardiac symptoms with increased activity or exertion.  Breathing stable.  Bowels stable.   States her blood pressure has been averaging 127/72-80.       Past Medical History  Diagnosis Date  . Hypertension   . Anemia   . History of colon polyps     Current Outpatient Prescriptions on File Prior to Visit  Medication Sig Dispense Refill  . ferrous sulfate 325 (65 FE) MG tablet Take 1 tablet (325 mg total) by mouth daily with breakfast.  30 tablet  5  . hydrochlorothiazide (HYDRODIURIL) 25 MG tablet Take 1 tablet (25 mg total) by mouth daily.  30 tablet  3   No current facility-administered medications on file prior to visit.    Review of Systems Patient denies any headache, lightheadedness or dizziness.  No sinus or allergy symptoms.  No chest pain, tightness or palpitations.  No increased shortness of breath, cough or congestion.  No nausea or vomiting.  No acid reflux.  No abdominal pain or cramping.  No bowel change, such as diarrhea, constipation, BRBPR or melana.  No urine change.  taking her blood pressure medication regularly.  On the HCTZ.  Blood pressure as outlined.        Objective:   Physical Exam  Filed Vitals:   10/01/13 0919  BP: 140/78  Pulse: 74  Temp: 98.1 F (36.7 C)   Blood pressure recheck:  82-23/57  63 year old female in no acute distress.   HEENT:  Nares- clear.  Oropharynx - without lesions. NECK:  Supple.  Nontender.  No audible bruit.  HEART:  Appears to be regular. LUNGS:  No crackles or wheezing audible.  Respirations even and unlabored.  RADIAL PULSE:  Equal bilaterally.   ABDOMEN:  Soft, nontender.  Bowel sounds present and normal.  No audible abdominal bruit.    EXTREMITIES:  No increased edema present.  DP pulses palpable and equal bilaterally.          Assessment & Plan:  PULMONARY.  Has a history of reduced DLCO.  Has declined further w/up.   GYN.  Previous pap ASCUS with negative HPV.  Referred to Dr Enzo Bi.  Cervical biopsy 05/01/10 - benign cyst.  Pap 07/15/11 - negative.  Repeat pap at physical (02/24/13) negative with negative HPV.     HEALTH MAINTENANCE.  Physical 02/24/13.  Colonoscopy 07/08/11 - internal hemorrhoids.  Mammogram 09/23/12 negative.  Schedule mammogram.

## 2013-11-02 ENCOUNTER — Ambulatory Visit: Payer: Self-pay | Admitting: Internal Medicine

## 2013-11-02 ENCOUNTER — Encounter: Payer: Self-pay | Admitting: *Deleted

## 2013-11-02 LAB — HM MAMMOGRAPHY: HM Mammogram: NEGATIVE

## 2013-11-22 ENCOUNTER — Encounter: Payer: Self-pay | Admitting: Internal Medicine

## 2013-12-10 ENCOUNTER — Ambulatory Visit: Payer: 59 | Admitting: Internal Medicine

## 2014-02-24 ENCOUNTER — Ambulatory Visit: Payer: Self-pay | Admitting: Nurse Practitioner

## 2014-02-24 ENCOUNTER — Ambulatory Visit (INDEPENDENT_AMBULATORY_CARE_PROVIDER_SITE_OTHER): Payer: 59 | Admitting: Nurse Practitioner

## 2014-02-24 ENCOUNTER — Encounter: Payer: Self-pay | Admitting: Nurse Practitioner

## 2014-02-24 VITALS — BP 152/98 | HR 74 | Temp 97.8°F | Resp 14 | Ht 62.0 in | Wt 224.8 lb

## 2014-02-24 DIAGNOSIS — M25561 Pain in right knee: Secondary | ICD-10-CM

## 2014-02-24 DIAGNOSIS — M25562 Pain in left knee: Secondary | ICD-10-CM

## 2014-02-24 NOTE — Progress Notes (Signed)
Subjective:    [name], female    DOB: 1950-01-31, 64 y.o.   MRN: [id number]  HPI  Kelly Wallace is a 64 yo female with a CC of knee pain bilaterally with the left being worse than the right x 2 years.   Wt Readings from Last 3 Encounters:  02/24/14 224 lb 12.8 oz (101.969 kg)  10/01/13 220 lb 12 oz (100.132 kg)  05/25/13 218 lb 4 oz (98.998 kg)   Left > Right  2 years, getting worse in past month. Walking and left knee gives out, no falls. Sitting for 30 min to an hour, painful. Standing is worse.   X-ray'd knees years ago.   Ibuprofen once or twice a week- helpful  Severity 5/10 worst 0/10 at best- lying down  Cramping in legs Gastric bypass in 2005, gained some back.   Trying to eat better   Review of Systems  Constitutional: Negative for fever, chills, diaphoresis and fatigue.  Respiratory: Negative for chest tightness, shortness of breath and wheezing.   Cardiovascular: Negative for chest pain, palpitations and leg swelling.  Gastrointestinal: Negative for nausea, vomiting, diarrhea and rectal pain.  Musculoskeletal: Positive for arthralgias.       Left knee > right knee pain  Skin: Negative for rash.  Neurological: Negative for dizziness, weakness, numbness and headaches.  Psychiatric/Behavioral: The patient is not nervous/anxious.    Past Medical History  Diagnosis Date  . Hypertension   . Anemia   . History of colon polyps     History   Social History  . Marital Status: Married    Spouse Name: N/A    Number of Children: 2  . Years of Education: N/A   Occupational History  .     Social History Main Topics  . Smoking status: Never Smoker   . Smokeless tobacco: Never Used  . Alcohol Use: No  . Drug Use: No  . Sexual Activity: Not on file   Other Topics Concern  . Not on file   Social History Narrative    Past Surgical History  Procedure Laterality Date  . Tubal ligation  1976  . Cholecystectomy  1989  . Gastric bypass  03/21/03     Family History  Problem Relation Age of Onset  . Hypertension Mother   . Diabetes Mother   . Cancer Maternal Aunt     question of type  . Cancer Maternal Grandmother     colon    No Known Allergies  Current Outpatient Prescriptions on File Prior to Visit  Medication Sig Dispense Refill  . ferrous sulfate 325 (65 FE) MG tablet Take 1 tablet (325 mg total) by mouth daily with breakfast. 30 tablet 5  . hydrochlorothiazide (HYDRODIURIL) 25 MG tablet Take 1 tablet (25 mg total) by mouth daily. 30 tablet 3   No current facility-administered medications on file prior to visit.       Objective:   Physical Exam  Constitutional: She is oriented to person, place, and time. She appears well-developed and well-nourished. No distress.  BP 152/98 mmHg  Pulse 74  Temp(Src) 97.8 F (36.6 C) (Oral)  Resp 14  Ht 5\' 2"  (1.575 m)  Wt 224 lb 12.8 oz (101.969 kg)  BMI 41.11 kg/m2  SpO2 97%  LMP 04/06/2009 Repeat BP was 152/98  HENT:  Head: Normocephalic and atraumatic.  Right Ear: External ear normal.  Left Ear: External ear normal.  Eyes: Right eye exhibits no discharge. Left eye exhibits  no discharge. No scleral icterus.  Neck: Normal range of motion. Neck supple.  Cardiovascular: Normal rate, regular rhythm, normal heart sounds and intact distal pulses.  Exam reveals no gallop and no friction rub.   No murmur heard. Pulmonary/Chest: Effort normal and breath sounds normal. No respiratory distress. She has no wheezes. She has no rales. She exhibits no tenderness.  Musculoskeletal: She exhibits no edema or tenderness.  Normal ROM, lots of crepitus left > right, negative anterior drawer and lachman test  Neurological: She is alert and oriented to person, place, and time. No cranial nerve deficit. She exhibits normal muscle tone. Coordination normal.  Skin: Skin is warm and dry. No rash noted. She is not diaphoretic.  Psychiatric: She has a normal mood and affect. Her behavior is normal.  Judgment and thought content normal.      Assessment & Plan:

## 2014-02-24 NOTE — Patient Instructions (Signed)

## 2014-02-24 NOTE — Progress Notes (Signed)
Pre visit review using our clinic review tool, if applicable. No additional management support is needed unless otherwise documented below in the visit note. 

## 2014-02-27 DIAGNOSIS — M25562 Pain in left knee: Principal | ICD-10-CM

## 2014-02-27 DIAGNOSIS — M25561 Pain in right knee: Secondary | ICD-10-CM | POA: Insufficient documentation

## 2014-02-27 NOTE — Assessment & Plan Note (Signed)
Worsening. Will obtain Knee x-rays bilaterally. Hand out on knee pain. Emphasized Rest, Ice, and Elevation. FU after results.

## 2014-03-15 ENCOUNTER — Telehealth: Payer: Self-pay | Admitting: *Deleted

## 2014-03-15 ENCOUNTER — Other Ambulatory Visit: Payer: Self-pay | Admitting: Nurse Practitioner

## 2014-03-15 DIAGNOSIS — M170 Bilateral primary osteoarthritis of knee: Secondary | ICD-10-CM

## 2014-03-15 NOTE — Telephone Encounter (Signed)
Pt left voicemail stating that she has called 3 times to get the results of her knee x-ray. Please call patient today with results. (Results in yellow folder)

## 2014-03-15 NOTE — Telephone Encounter (Signed)
Both knees are severely osteoarthritic. We will need to send you to Orthopedics for further care. If agreeable to this I will place a referral. Let me know! Thanks.

## 2014-03-15 NOTE — Telephone Encounter (Signed)
Pt notified & would like to proceed with referral to Ortho.

## 2014-03-16 NOTE — Telephone Encounter (Signed)
Referral was placed yesterday- just wanted to note that. Thanks!

## 2014-03-24 ENCOUNTER — Ambulatory Visit (INDEPENDENT_AMBULATORY_CARE_PROVIDER_SITE_OTHER): Payer: 59 | Admitting: Nurse Practitioner

## 2014-03-24 ENCOUNTER — Encounter: Payer: Self-pay | Admitting: Nurse Practitioner

## 2014-03-24 VITALS — BP 128/72 | HR 73 | Temp 98.6°F | Resp 14 | Ht 62.0 in | Wt 219.4 lb

## 2014-03-24 DIAGNOSIS — M25562 Pain in left knee: Secondary | ICD-10-CM

## 2014-03-24 DIAGNOSIS — M25561 Pain in right knee: Secondary | ICD-10-CM

## 2014-03-24 NOTE — Progress Notes (Signed)
   Subjective:    [name], female    DOB: July 18, 1950, 64 y.o.   MRN: [id number]  HPI  Kelly Wallace is a 64 yo female following up for knee pain.  1) Bilateral knee pain without increase of pain since 02/24/14. Ortho appointment on Friday at [place] with Reche Dixon, PA-C.  Staying about the same  Walking every day at work and in general, no formal exercise.  Asked about something to help with diet.    Review of Systems  Constitutional: Negative for fever, chills, diaphoresis and fatigue.  Respiratory: Negative for chest tightness, shortness of breath and wheezing.   Cardiovascular: Negative for chest pain, palpitations and leg swelling.  Gastrointestinal: Negative for nausea, vomiting and diarrhea.  Musculoskeletal: Positive for arthralgias.       Bilateral knee pain  Skin: Negative for rash.  Neurological: Negative for dizziness, weakness, numbness and headaches.  Psychiatric/Behavioral: The patient is not nervous/anxious.        Objective:   Physical Exam  Constitutional: She is oriented to person, place, and time. She appears well-developed and well-nourished. No distress.  BP 128/72 mmHg  Pulse 73  Temp(Src) 98.6 F (37 C) (Oral)  Resp 14  Ht 5\' 2"  (1.575 m)  Wt 219 lb 6.4 oz (99.519 kg)  BMI 40.12 kg/m2  SpO2 98%  LMP 04/06/2009   HENT:  Head: Normocephalic and atraumatic.  Right Ear: External ear normal.  Left Ear: External ear normal.  Cardiovascular: Normal rate, regular rhythm, normal heart sounds and intact distal pulses.  Exam reveals no gallop and no friction rub.   No murmur heard. Pulmonary/Chest: Effort normal and breath sounds normal. No respiratory distress. She has no wheezes. She has no rales. She exhibits no tenderness.  Musculoskeletal: She exhibits tenderness.  Bilateral tender knees (generalized tenderness), enlarged, no evidence of effusion, Full ROM bilaterally, 5/5 strength lower extremities.   Neurological: She is alert and  oriented to person, place, and time. She has normal reflexes. No cranial nerve deficit. She exhibits normal muscle tone. Coordination normal.  Skin: Skin is warm and dry. No rash noted. She is not diaphoretic.  Psychiatric: She has a normal mood and affect. Her behavior is normal. Judgment and thought content normal.      Assessment & Plan:

## 2014-03-24 NOTE — Assessment & Plan Note (Signed)
No change. Will see Reche Dixon, PA-C at [facility] for further evaluation and treatment. Asked pt to discuss weight loss medications with her PCP.

## 2014-03-24 NOTE — Progress Notes (Signed)
Pre visit review using our clinic review tool, if applicable. No additional management support is needed unless otherwise documented below in the visit note. 

## 2014-03-24 NOTE — Patient Instructions (Signed)
Continue to follow up with your Orthopedic Doctor on Friday.

## 2014-04-01 ENCOUNTER — Telehealth: Payer: Self-pay | Admitting: Internal Medicine

## 2014-04-01 ENCOUNTER — Other Ambulatory Visit: Payer: Self-pay | Admitting: *Deleted

## 2014-04-01 MED ORDER — HYDROCHLOROTHIAZIDE 25 MG PO TABS: 25.0000 mg | ORAL_TABLET | Freq: Every day | ORAL | Status: DC

## 2014-04-01 NOTE — Telephone Encounter (Signed)
hydrochlorothiazide (HYDRODIURIL) 25 MG tablet

## 2014-04-06 ENCOUNTER — Ambulatory Visit: Payer: Self-pay | Admitting: Orthopedic Surgery

## 2014-04-20 ENCOUNTER — Ambulatory Visit: Payer: Self-pay | Admitting: Orthopedic Surgery

## 2014-04-20 LAB — CBC
HCT: 35.9 % (ref 35.0–47.0)
HGB: 11.5 g/dL — ABNORMAL LOW (ref 12.0–16.0)
MCH: 28.4 pg (ref 26.0–34.0)
MCHC: 32.1 g/dL (ref 32.0–36.0)
MCV: 89 fL (ref 80–100)
Platelet: 190 10*3/uL (ref 150–440)
RBC: 4.06 10*6/uL (ref 3.80–5.20)
RDW: 14.8 % — ABNORMAL HIGH (ref 11.5–14.5)
WBC: 5.5 10*3/uL (ref 3.6–11.0)

## 2014-04-20 LAB — URINALYSIS, COMPLETE
Bacteria: NONE SEEN
Bilirubin,UR: NEGATIVE
Blood: NEGATIVE
Glucose,UR: NEGATIVE mg/dL (ref 0–75)
Hyaline Cast: 5
Ketone: NEGATIVE
Nitrite: NEGATIVE
Ph: 5 (ref 4.5–8.0)
Protein: NEGATIVE
RBC,UR: 2 /HPF (ref 0–5)
Specific Gravity: 1.016 (ref 1.003–1.030)
Squamous Epithelial: 7
WBC UR: 1 /HPF (ref 0–5)

## 2014-04-20 LAB — BASIC METABOLIC PANEL
Anion Gap: 8 (ref 7–16)
BUN: 15 mg/dL
Calcium, Total: 8.3 mg/dL — ABNORMAL LOW
Chloride: 106 mmol/L
Co2: 26 mmol/L
Creatinine: 0.64 mg/dL
EGFR (African American): >60
EGFR (Non-African Amer.): >60
Glucose: 100 mg/dL — ABNORMAL HIGH
Potassium: 3 mmol/L — ABNORMAL LOW
Sodium: 140 mmol/L

## 2014-04-20 LAB — MRSA PCR SCREENING

## 2014-04-20 LAB — PROTIME-INR
INR: 1.1
Prothrombin Time: 14.4 secs

## 2014-04-20 LAB — APTT: Activated PTT: 32.4 secs (ref 23.6–35.9)

## 2014-04-20 LAB — SEDIMENTATION RATE: Erythrocyte Sed Rate: 28 mm/hr (ref 0–30)

## 2014-04-21 LAB — URINE CULTURE

## 2014-04-22 HISTORY — PX: JOINT REPLACEMENT: SHX530

## 2014-05-05 ENCOUNTER — Inpatient Hospital Stay
Admit: 2014-05-05 | Disposition: A | Discharge status: Home / Self Care | Payer: Self-pay | Attending: Orthopedic Surgery | Admitting: Orthopedic Surgery

## 2014-05-05 LAB — URINALYSIS, COMPLETE
Bacteria: NONE SEEN
Bilirubin,UR: NEGATIVE
Glucose,UR: NEGATIVE mg/dL (ref 0–75)
Ketone: NEGATIVE
Nitrite: NEGATIVE
Ph: 5 (ref 4.5–8.0)
Protein: NEGATIVE
Specific Gravity: 1.016 (ref 1.003–1.030)

## 2014-05-06 LAB — BASIC METABOLIC PANEL
Anion Gap: 5 — ABNORMAL LOW (ref 7–16)
BUN: 9 mg/dL
Calcium, Total: 7.7 mg/dL — ABNORMAL LOW
Chloride: 108 mmol/L
Co2: 25 mmol/L
Creatinine: 0.59 mg/dL
EGFR (African American): >60
EGFR (Non-African Amer.): >60
Glucose: 126 mg/dL — ABNORMAL HIGH
Potassium: 3.2 mmol/L — ABNORMAL LOW
Sodium: 138 mmol/L

## 2014-05-06 LAB — HEMOGLOBIN: HGB: 9.9 g/dL — ABNORMAL LOW (ref 12.0–16.0)

## 2014-05-06 LAB — PLATELET COUNT: Platelet: 172 10*3/uL (ref 150–440)

## 2014-05-07 LAB — BASIC METABOLIC PANEL
Anion Gap: 6 — ABNORMAL LOW (ref 7–16)
BUN: 6 mg/dL
Calcium, Total: 7.9 mg/dL — ABNORMAL LOW
Chloride: 103 mmol/L
Co2: 26 mmol/L
Creatinine: 0.64 mg/dL
EGFR (African American): >60
EGFR (Non-African Amer.): >60
Glucose: 138 mg/dL — ABNORMAL HIGH
Potassium: 3.1 mmol/L — ABNORMAL LOW
Sodium: 135 mmol/L

## 2014-05-07 LAB — URINE CULTURE

## 2014-05-07 LAB — HEMOGLOBIN: HGB: 10.6 g/dL — ABNORMAL LOW (ref 12.0–16.0)

## 2014-05-08 LAB — BASIC METABOLIC PANEL
Anion Gap: 7 (ref 7–16)
BUN: 8 mg/dL
Calcium, Total: 8.5 mg/dL — ABNORMAL LOW
Chloride: 105 mmol/L
Co2: 25 mmol/L
Creatinine: 0.77 mg/dL
EGFR (African American): >60
EGFR (Non-African Amer.): >60
Glucose: 125 mg/dL — ABNORMAL HIGH
Potassium: 3.7 mmol/L
Sodium: 137 mmol/L

## 2014-05-16 LAB — SURGICAL PATHOLOGY

## 2014-05-22 NOTE — Op Note (Signed)
PATIENT NAME:  Kelly Wallace, Kelly Wallace MR#:  [id number] DATE OF BIRTH:  11-21-1950  DATE OF PROCEDURE:  05/05/2014  PREOPERATIVE DIAGNOSIS:  Right knee severe osteoarthritis with large bone cyst.   POSTOPERATIVE DIAGNOSIS:  Right knee severe osteoarthritis with large bone cyst.   PROCEDURE:  Right total knee replacement.   ANESTHESIA:  Spinal.   SURGEON: Laurene Footman, MD   ASSISTANT:  Rachelle Hora, PA-C   DESCRIPTION OF PROCEDURE:  The patient was brought to the operating room, and after adequate anesthesia was obtained, the right leg was prepped and draped in the usual sterile fashion. Appropriate patient identification and timeout procedures were completed and a midline skin incision was made, followed by a medial parapatellar arthrotomy. Inspection revealed exposed bone in all 3 compartments with extensive degenerative changes. The fat pad was excised along with the PCL and ACL, and the proximal tibia cutting guide for the [place] system was applied after elevation of the medial soft tissues. A proximal tibia cut was carried out and the bone removed. Identical procedure carried out on the femur. The 4-in-1 cutting guide for the distal femur was carried out with anterior, posterior, and chamfer cuts using a size 3 cutting guide. The trial was placed. Distal femoral drill hole was made. Next, attention was turned to the tibia. There was a small cyst on the medial femoral condyle approximately 1.5 cm in diameter and depth. The cystic material was removed and sent as part of the specimen along with the cut ends of bone. On the tibia, the defect was quite a bit larger. It was curetted out. Then, subsequently during the procedure, the femoral head was ground with a reamer to get good cancellous bone and this was impacted with a graft impaction technique to fill this defect with solid bone. The proximal tibia was prepared with a size 2 guide, as it seemed to give better fit. Proximal drilling was carried out,  and then after the guide rod was inserted, a fresh cut was made to flatten the surface and make it perpendicular to the rod using the revision system secondary to the bony defects. An offset was required, 3 mm, and drilling carried out for this followed by keel punch. It ended up with a size 2 stem with a 3 mm offset and an 11 x 105 mm intramedullary rod extension that gave good stability. After that was set, a 12 mm insert gave excellent stability of the femoral trial. All trials were removed, and the tourniquet was let down after the trochlear cut was carried out. The knee was infiltrated with Exparel diluted with saline and a combination of morphine, Toradol, and 0.25% Sensorcaine with epinephrine. At this point, the bone was morselized out of the femoral head using acetabular reamers, and the impaction technique was made to fill these defects with bone. Excellent stability to the bone was obtained without any fracture of the periphery of the proximal tibia. The tourniquet was raised and the bony surfaces thoroughly dried. The cement was mixed and the tibial component was inserted down the canal and fit very well. The 12 mm insert was then set and set screw placed. The femoral component was impacted into place with excess cement removed. The knee was held in extension. Cement was then mixed for the patella, and a size 2 patella was clamped into place with all components cemented. After the cement had set, the knee was irrigated with dilute Betadine solution followed by pulsatile lavage after excess cement was removed.  The arthrotomy was then repaired using an Ethibond medially and then a heavy quill, 2-0 quill subcutaneously, and skin staples. Xeroform, 4 x 4's, Webril, Polar Care, and Ace wrap were applied, and the patient was sent to the recovery room in stable condition.   ESTIMATED BLOOD LOSS:  75.   TOURNIQUET TIME:  119 minutes at 300 mmHg.   IMPLANTS:  Medacta GMK Sphere, right, 3, with a size 2  tibial baseplate with a 12 mm flex insert, a 3 mm offset, and an 11 x 105 stem for the tibial component and a size 2 patella.   COMPLICATIONS:  None.   ____________________________ Laurene Footman, MD mjm:nb D: 05/05/2014 19:03:12 ET T: 05/06/2014 02:13:10 ET JOB#: [id number]  cc: Laurene Footman, MD, <Dictator> Laurene Footman MD ELECTRONICALLY SIGNED 05/06/2014 8:19

## 2014-05-22 NOTE — Discharge Summary (Signed)
PATIENT NAME:  Kelly Wallace, Kelly Wallace MR#:  [id number] DATE OF BIRTH:  04/05/1950  DATE OF ADMISSION:  05/05/2014 DATE OF DISCHARGE:  05/08/2014  ADMITTING DIAGNOSIS: Right knee severe osteoarthritis.   DISCHARGE DIAGNOSIS: Right knee severe osteoarthritis with a large bone cyst.   PROCEDURE: Right total knee replacement.   ANESTHESIA: Spinal.   SURGEON: Laurene Footman, M.D.   ASSISTANT: Rachelle Hora, P-AC.  ESTIMATED BLOOD LOSS: 75 mL.  TOURNIQUET TIME:  119 minutes at 300 mmHg.  IMPLANTS:  Medacta GMK sphere right 3 with a size 2 tibial baseplate with a 12 mm Flex insert, a 3 mm offset, 11 x 105 stem for the tibial component, and a size 2 patella.   COMPLICATIONS: None.   HISTORY OF PRESENT ILLNESS: The patient is a 64 year old female who presents today for evaluation of right greater than left knee pain. The patient has had knee pain for many years. She denies any recent trauma or injury. Pain is 5/10 and constant in the medial compartment of the right knee. The pain is described as a deep ache, but does not radiate.  Pain is interfering with her activities of daily living. She has severe pain with standing and walking for long periods of time. 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  Pain and discomfort is affecting her ability to work. She takes ibuprofen 800 mg occasionally as needed for pain. This does not give her much relief. The patient's states she has been ambulating with a limp for the last several months. She denies any back pain, numbness, tingling in her lower extremities.   PHYSICAL EXAMINATION:  GENERAL: Well-developed, well-nourished female in no apparent distress. Normal affect.  HEENT: Head normocephalic, atraumatic.  ABDOMEN: Soft, nontender, nondistended.  HEART: Regular rate and rhythm.  LUNGS: Clear to auscultation bilaterally.  RIGHT LOWER EXTREMITY: Examination shows the patient has no edema. No effusion. No ecchymosis. There is mild swelling. She is  tender along the medial joint line. Range of motion is 0 to 108 degrees.  She has mild discomfort with knee range of motion. She has no laxity with valgus and varus stress testing. She is neurovascularly intact in bilateral lower extremities.   HOSPITAL COURSE: The patient was admitted to the hospital on 05/05/2014. She had surgery that same day and was brought to the orthopedic floor from the PACU in stable condition. On postoperative day 1, the patient had an acute postoperative blood loss anemia with a hemoglobin of 9.9. On postoperative day 2, hemoglobin trended up to 10.6. Also on postoperative day 1, potassium was low at 3.2. She was given potassium supplementation, but potassium continued to drop to 3.1. On postoperative day 2, potassium supplementation was increased to 40 mEq t.i.d. and by postoperative day 3 potassium was up to 3.7. The patient did have gradual progress in physical therapy over her stay at the hospital. By postoperative day 2, the patient was ambulating 75 feet.  By postoperative day 3, the patient was able to ambulate 200 feet, complete stairs, and she was stable and ready for discharge to home with home health PT.   CONDITION AT DISCHARGE: Stable.   DISCHARGE INSTRUCTIONS: The patient may gradually increase weight on the affected extremity. Elevate the affected foot and leg 1 to 2 pillows with the foot higher than the knee. Thigh-high TED hose on both legs, remove at bedtime, replace on arising the next morning. Elevate the heels off the bed. Incentive spirometer every hour while awake. Encourage  cough and deep breathing. The patient may resume her regular diet as tolerated. Continue using Polar Care Unit maintaining a temperature between 40 and 50 degrees.  Do not get the dressing or bandage wet or dirty. Call [facility] if the dressing gets water under it. Leave the dressing on. Call [facility] if any of the following occur: Bright red bleeding from the incision wound, fever above  101.5 degrees, redness, swelling, or drainage at the incision site. Call [facility] if you experience any increased leg pain, numbness or weakness in legs, or bowel or bladder symptoms. Home physical therapy has been arranged for continuation of rehab program.  Call [facility] if a therapist has not contacted you within 48 hours of your return home. Please follow up with [facility] in 2 weeks.   DISCHARGE MEDICATIONS: Please see list of discharge medications on discharge instructions.   ____________________________ T. Rachelle Hora, PA-C tcg:sp D: 05/08/2014 08:22:23 ET T: 05/08/2014 11:05:32 ET JOB#: [id number]  cc: T. Rachelle Hora, PA-C, <Dictator> Duanne Guess [place] ELECTRONICALLY SIGNED 05/19/2014 14:17

## 2014-06-06 ENCOUNTER — Telehealth: Payer: Self-pay | Admitting: Internal Medicine

## 2014-06-06 NOTE — Telephone Encounter (Signed)
Pt dropped off md notes from knee surgury. Notes in Dr. Bary Leriche box/msn

## 2014-07-29 ENCOUNTER — Telehealth: Payer: Self-pay | Admitting: Internal Medicine

## 2014-07-29 NOTE — Telephone Encounter (Signed)
Left message for pt to call office to make follow up visit/msn

## 2014-10-25 ENCOUNTER — Encounter: Payer: Self-pay | Admitting: Internal Medicine

## 2014-10-25 ENCOUNTER — Ambulatory Visit (INDEPENDENT_AMBULATORY_CARE_PROVIDER_SITE_OTHER): Payer: 59 | Admitting: Internal Medicine

## 2014-10-25 VITALS — BP 138/80 | HR 67 | Temp 97.8°F | Resp 18 | Ht 62.0 in | Wt 219.5 lb

## 2014-10-25 DIAGNOSIS — Z1239 Encounter for other screening for malignant neoplasm of breast: Secondary | ICD-10-CM | POA: Diagnosis not present

## 2014-10-25 DIAGNOSIS — L750 Bromhidrosis: Secondary | ICD-10-CM

## 2014-10-25 DIAGNOSIS — R748 Abnormal levels of other serum enzymes: Secondary | ICD-10-CM

## 2014-10-25 DIAGNOSIS — L749 Eccrine sweat disorder, unspecified: Secondary | ICD-10-CM | POA: Diagnosis not present

## 2014-10-25 DIAGNOSIS — I10 Essential (primary) hypertension: Secondary | ICD-10-CM | POA: Diagnosis not present

## 2014-10-25 DIAGNOSIS — D649 Anemia, unspecified: Secondary | ICD-10-CM

## 2014-10-25 LAB — CBC WITH DIFFERENTIAL/PLATELET
Basophils Absolute: 0 10*3/uL (ref 0.0–0.1)
Basophils Relative: 0.3 % (ref 0.0–3.0)
Eosinophils Absolute: 0.2 10*3/uL (ref 0.0–0.7)
Eosinophils Relative: 2.8 % (ref 0.0–5.0)
HCT: 35.5 % — ABNORMAL LOW (ref 36.0–46.0)
Hemoglobin: 11.5 g/dL — ABNORMAL LOW (ref 12.0–15.0)
Lymphocytes Relative: 38.5 % (ref 12.0–46.0)
Lymphs Abs: 2.3 10*3/uL (ref 0.7–4.0)
MCHC: 32.5 g/dL (ref 30.0–36.0)
MCV: 85.9 fl (ref 78.0–100.0)
Monocytes Absolute: 0.4 10*3/uL (ref 0.1–1.0)
Monocytes Relative: 6.1 % (ref 3.0–12.0)
Neutro Abs: 3.1 10*3/uL (ref 1.4–7.7)
Neutrophils Relative %: 52.3 % (ref 43.0–77.0)
Platelets: 199 10*3/uL (ref 150.0–400.0)
RBC: 4.14 Mil/uL (ref 3.87–5.11)
RDW: 14.7 % (ref 11.5–15.5)
WBC: 6 10*3/uL (ref 4.0–10.5)

## 2014-10-25 LAB — URINALYSIS, ROUTINE W REFLEX MICROSCOPIC
Bilirubin Urine: NEGATIVE
Hgb urine dipstick: NEGATIVE
Ketones, ur: NEGATIVE
Leukocytes, UA: NEGATIVE
Nitrite: NEGATIVE
Specific Gravity, Urine: 1.025 (ref 1.000–1.030)
Total Protein, Urine: NEGATIVE
Urine Glucose: NEGATIVE
Urobilinogen, UA: 0.2 (ref 0.0–1.0)
pH: 6 (ref 5.0–8.0)

## 2014-10-25 LAB — BASIC METABOLIC PANEL
BUN: 10 mg/dL (ref 6–23)
CO2: 25 mEq/L (ref 19–32)
Calcium: 9.1 mg/dL (ref 8.4–10.5)
Chloride: 107 mEq/L (ref 96–112)
Creatinine, Ser: 0.64 mg/dL (ref 0.40–1.20)
GFR: 120.76 mL/min (ref >60.00)
Glucose, Bld: 84 mg/dL (ref 70–99)
Potassium: 3.9 mEq/L (ref 3.5–5.1)
Sodium: 140 mEq/L (ref 135–145)

## 2014-10-25 LAB — LIPID PANEL
Cholesterol: 90 mg/dL (ref 0–200)
HDL: 53 mg/dL (ref >39.00)
LDL Cholesterol: 27 mg/dL (ref 0–99)
NonHDL: 37.29
Total CHOL/HDL Ratio: 2
Triglycerides: 53 mg/dL (ref 0.0–149.0)
VLDL: 10.6 mg/dL (ref 0.0–40.0)

## 2014-10-25 LAB — HEPATIC FUNCTION PANEL
ALT: 12 U/L (ref 0–35)
AST: 17 U/L (ref 0–37)
Albumin: 3.9 g/dL (ref 3.5–5.2)
Alkaline Phosphatase: 162 U/L — ABNORMAL HIGH (ref 39–117)
Bilirubin, Direct: 0.1 mg/dL (ref 0.0–0.3)
Total Bilirubin: 0.5 mg/dL (ref 0.2–1.2)
Total Protein: 7.4 g/dL (ref 6.0–8.3)

## 2014-10-25 LAB — TSH: TSH: 0.89 u[IU]/mL (ref 0.35–4.50)

## 2014-10-25 LAB — FERRITIN: Ferritin: 6.3 ng/mL — ABNORMAL LOW (ref 10.0–291.0)

## 2014-10-25 MED ORDER — LISINOPRIL 10 MG PO TABS: 10.0000 mg | ORAL_TABLET | Freq: Every day | ORAL | Status: DC

## 2014-10-25 NOTE — Progress Notes (Signed)
Pre-visit discussion using our clinic review tool. No additional management support is needed unless otherwise documented below in the visit note.  

## 2014-10-25 NOTE — Progress Notes (Signed)
Patient ID: DAVONDA AUSLEY, female   DOB: 1950-09-16, 64 y.o.   MRN: [id number]   Subjective:    Patient ID: [name], female    DOB: 05/16/1950, 64 y.o.   MRN: [id number]  HPI  Patient with past history of hypertension, anemia and elevated alkaline phos level.  She comes in today to follow up on these issues.  She is s/p TKR in 04/2014.  Doing well.  Needs the other knee operated on.  Discussed diet and exercise.  She is taking her blood pressure medication regularly.  Did not take today.  No chest pain or tightness.  No sob.  No acid reflux reported.  Reports some urine odor.     Past Medical History  Diagnosis Date  . Hypertension   . Anemia   . History of colon polyps    Past Surgical History  Procedure Laterality Date  . Tubal ligation  1976  . Cholecystectomy  1989  . Gastric bypass  03/21/03   Family History  Problem Relation Age of Onset  . Hypertension Mother   . Diabetes Mother   . Cancer Maternal Aunt     question of type  . Cancer Maternal Grandmother     colon   Social History   Social History  . Marital Status: Married    Spouse Name: N/A  . Number of Children: 2  . Years of Education: N/A   Occupational History  .     Social History Main Topics  . Smoking status: Never Smoker   . Smokeless tobacco: Never Used  . Alcohol Use: No  . Drug Use: No  . Sexual Activity: Not Asked   Other Topics Concern  . None   Social History Narrative    Outpatient Encounter Prescriptions as of 10/25/2014  Medication Sig  . ferrous sulfate 325 (65 FE) MG tablet Take 1 tablet (325 mg total) by mouth daily with breakfast.  . hydrochlorothiazide (HYDRODIURIL) 25 MG tablet Take 1 tablet (25 mg total) by mouth daily.  Marland Kitchen lisinopril (PRINIVIL,ZESTRIL) 10 MG tablet Take 1 tablet (10 mg total) by mouth daily.   No facility-administered encounter medications on file as of 10/25/2014.    Review of Systems  Constitutional: Negative for appetite change and unexpected weight  change.  HENT: Negative for congestion and sinus pressure.   Eyes: Negative for pain and visual disturbance.  Respiratory: Negative for cough, chest tightness and shortness of breath.   Cardiovascular: Negative for chest pain, palpitations and leg swelling.  Gastrointestinal: Negative for nausea, vomiting, abdominal pain and diarrhea.  Genitourinary: Negative for dysuria.       Urine odor.    Musculoskeletal: Negative for back pain and joint swelling.       S/p TKR.  Doing better.    Skin: Negative for color change and rash.  Neurological: Negative for dizziness, light-headedness and headaches.  Psychiatric/Behavioral: Negative for dysphoric mood and agitation.       Objective:     Blood pressure rechecked by me:  148/78  Physical Exam  HENT:  Nose: Nose normal.  Mouth/Throat: Oropharynx is clear and moist.  Neck: Neck supple. No thyromegaly present.  Cardiovascular: Normal rate and regular rhythm.   Pulmonary/Chest: Breath sounds normal. No respiratory distress. She has no wheezes.  Abdominal: Soft. Bowel sounds are normal. There is no tenderness.  Musculoskeletal: She exhibits no edema or tenderness.  Lymphadenopathy:    She has no cervical adenopathy.  Skin: No rash noted. No erythema.  Psychiatric: She has a normal mood and affect. Her behavior is normal.    BP 138/80 mmHg  Pulse 67  Temp(Src) 97.8 F (36.6 C) (Oral)  Resp 18  Ht 5\' 2"  (1.575 m)  Wt 219 lb 8 oz (99.565 kg)  BMI 40.14 kg/m2  SpO2 97%  LMP 04/06/2009 Wt Readings from Last 3 Encounters:  10/25/14 219 lb 8 oz (99.565 kg)  03/24/14 219 lb 6.4 oz (99.519 kg)  02/24/14 224 lb 12.8 oz (101.969 kg)     Lab Results  Component Value Date   WBC 6.0 10/25/2014   HGB 11.5* 10/25/2014   HCT 35.5* 10/25/2014   PLT 199.0 10/25/2014   GLUCOSE 84 10/25/2014   CHOL 90 10/25/2014   TRIG 53.0 10/25/2014   HDL 53.00 10/25/2014   LDLCALC 27 10/25/2014   ALT 12 10/25/2014   AST 17 10/25/2014   NA 140  10/25/2014   K 3.9 10/25/2014   CL 107 10/25/2014   CREATININE 0.64 10/25/2014   BUN 10 10/25/2014   CO2 25 10/25/2014   TSH 0.89 10/25/2014   INR 1.1 04/20/2014       Assessment & Plan:   Problem List Items Addressed This Visit    Anemia    H/o iron deficient anemia.  Colonoscopy 06/2011 internal hemorrhoids.  Follow cbc.        Relevant Orders   CBC with Differential/Platelet (Completed)   Ferritin (Completed)   Elevated alkaline phosphatase level   Relevant Orders   Hepatic function panel (Completed)   Essential hypertension, benign - Primary    Blood pressure elevated on today's check.  Her readings on outside checks averaging 716-967 systolic readings.  Will add lisinopril 10mg  q day.  Follow pressures.  Check metabolic panel today.  Recheck in two weeks after starting lisinopril.        Relevant Medications   lisinopril (PRINIVIL,ZESTRIL) 10 MG tablet   Other Relevant Orders   TSH (Completed)   Basic metabolic panel (Completed)   Lipid panel (Completed)    Other Visit Diagnoses    Screening breast examination        Relevant Orders    MM DIGITAL SCREENING BILATERAL    Urinary body odor        Relevant Orders    Urinalysis, Routine w reflex microscopic (not at [facility])    CULTURE, URINE COMPREHENSIVE        Einar Pheasant, MD

## 2014-10-26 ENCOUNTER — Other Ambulatory Visit: Payer: Self-pay | Admitting: Internal Medicine

## 2014-10-26 DIAGNOSIS — D509 Iron deficiency anemia, unspecified: Secondary | ICD-10-CM

## 2014-10-26 DIAGNOSIS — I10 Essential (primary) hypertension: Secondary | ICD-10-CM

## 2014-10-26 NOTE — Progress Notes (Signed)
Orders placed for f/u labs.  

## 2014-10-29 LAB — CULTURE, URINE COMPREHENSIVE: Colony Count: 10000

## 2014-10-30 ENCOUNTER — Encounter: Payer: Self-pay | Admitting: Internal Medicine

## 2014-10-30 NOTE — Assessment & Plan Note (Signed)
H/o iron deficient anemia.  Colonoscopy 06/2011 internal hemorrhoids.  Follow cbc.

## 2014-10-30 NOTE — Assessment & Plan Note (Signed)
Blood pressure elevated on today's check.  Her readings on outside checks averaging 599-774 systolic readings.  Will add lisinopril 10mg  q day.  Follow pressures.  Check metabolic panel today.  Recheck in two weeks after starting lisinopril.

## 2014-11-04 ENCOUNTER — Encounter: Payer: Self-pay | Admitting: Internal Medicine

## 2014-11-04 ENCOUNTER — Ambulatory Visit
Admission: RE | Admit: 2014-11-04 | Discharge: 2014-11-04 | Disposition: A | Discharge status: Home / Self Care | Payer: 59 | Source: Ambulatory Visit | Attending: Internal Medicine | Admitting: Internal Medicine

## 2014-11-04 DIAGNOSIS — Z1239 Encounter for other screening for malignant neoplasm of breast: Secondary | ICD-10-CM

## 2014-11-04 DIAGNOSIS — Z1231 Encounter for screening mammogram for malignant neoplasm of breast: Secondary | ICD-10-CM | POA: Insufficient documentation

## 2014-11-04 DIAGNOSIS — Z0000 Encounter for general adult medical examination without abnormal findings: Secondary | ICD-10-CM | POA: Insufficient documentation

## 2014-11-08 ENCOUNTER — Other Ambulatory Visit (INDEPENDENT_AMBULATORY_CARE_PROVIDER_SITE_OTHER): Payer: 59

## 2014-11-08 ENCOUNTER — Other Ambulatory Visit: Payer: 59

## 2014-11-08 DIAGNOSIS — I10 Essential (primary) hypertension: Secondary | ICD-10-CM | POA: Diagnosis not present

## 2014-11-08 DIAGNOSIS — D509 Iron deficiency anemia, unspecified: Secondary | ICD-10-CM

## 2014-11-08 LAB — BASIC METABOLIC PANEL
BUN: 13 mg/dL (ref 6–23)
CO2: 27 mEq/L (ref 19–32)
Calcium: 9.2 mg/dL (ref 8.4–10.5)
Chloride: 106 mEq/L (ref 96–112)
Creatinine, Ser: 0.7 mg/dL (ref 0.40–1.20)
GFR: 108.89 mL/min (ref >60.00)
Glucose, Bld: 100 mg/dL — ABNORMAL HIGH (ref 70–99)
Potassium: 4.4 mEq/L (ref 3.5–5.1)
Sodium: 139 mEq/L (ref 135–145)

## 2014-11-08 LAB — HEMOGLOBIN: Hemoglobin: 11.3 g/dL — ABNORMAL LOW (ref 12.0–15.0)

## 2014-11-09 ENCOUNTER — Telehealth: Payer: Self-pay | Admitting: *Deleted

## 2014-11-09 NOTE — Telephone Encounter (Signed)
Notified patient as instructed, patient pleased. Discussed follow-up appointments, patient agrees  

## 2014-11-09 NOTE — Telephone Encounter (Signed)
-----   Message from Einar Pheasant, MD sent at 11/08/2014 10:24 PM EDT ----- Notify pt that her kidney function is ok.  Potassium wnl.  Need to make sure she is taking iron.  We will follow.  Will recheck at her upcoming physical.

## 2014-11-24 ENCOUNTER — Encounter: Payer: 59 | Admitting: Internal Medicine

## 2014-12-28 ENCOUNTER — Telehealth: Payer: Self-pay | Admitting: Internal Medicine

## 2014-12-28 ENCOUNTER — Encounter: Payer: 59 | Admitting: Internal Medicine

## 2014-12-28 DIAGNOSIS — Z0289 Encounter for other administrative examinations: Secondary | ICD-10-CM

## 2014-12-28 NOTE — Telephone Encounter (Signed)
FYI, Pt called she over slept for her appt today. Pt states she works third shift. Pt appt is still on sch. Pt did make another appt. Thank You!

## 2014-12-30 NOTE — Telephone Encounter (Signed)
Ok to take her off schedule so will not get charged.  Thanks

## 2015-02-22 ENCOUNTER — Encounter: Payer: 59 | Admitting: Internal Medicine

## 2015-02-22 ENCOUNTER — Telehealth: Payer: Self-pay | Admitting: Internal Medicine

## 2015-02-22 NOTE — Telephone Encounter (Signed)
FYI,Pt called stating she will not be able to make her appt today, due to husband had a car accident this morning. Appt is still on the schedule. Pt scheduled another appt. Thank you!

## 2015-02-22 NOTE — Telephone Encounter (Signed)
Ok to take off schedule 

## 2015-02-22 NOTE — Telephone Encounter (Signed)
FYI, please let front desk know if okay to remove from schedule

## 2015-04-03 ENCOUNTER — Ambulatory Visit (INDEPENDENT_AMBULATORY_CARE_PROVIDER_SITE_OTHER): Payer: 59 | Admitting: Internal Medicine

## 2015-04-03 ENCOUNTER — Encounter: Payer: Self-pay | Admitting: Internal Medicine

## 2015-04-03 VITALS — BP 130/82 | HR 81 | Temp 97.4°F | Resp 18 | Ht 62.0 in | Wt 221.2 lb

## 2015-04-03 DIAGNOSIS — E611 Iron deficiency: Secondary | ICD-10-CM

## 2015-04-03 DIAGNOSIS — R748 Abnormal levels of other serum enzymes: Secondary | ICD-10-CM

## 2015-04-03 DIAGNOSIS — I10 Essential (primary) hypertension: Secondary | ICD-10-CM | POA: Diagnosis not present

## 2015-04-03 DIAGNOSIS — Z86010 Personal history of colonic polyps: Secondary | ICD-10-CM

## 2015-04-03 DIAGNOSIS — M25562 Pain in left knee: Secondary | ICD-10-CM

## 2015-04-03 DIAGNOSIS — Z0000 Encounter for general adult medical examination without abnormal findings: Secondary | ICD-10-CM

## 2015-04-03 DIAGNOSIS — M25561 Pain in right knee: Secondary | ICD-10-CM

## 2015-04-03 DIAGNOSIS — D649 Anemia, unspecified: Secondary | ICD-10-CM

## 2015-04-03 LAB — BASIC METABOLIC PANEL
BUN: 12 mg/dL (ref 6–23)
CO2: 24 mEq/L (ref 19–32)
Calcium: 9 mg/dL (ref 8.4–10.5)
Chloride: 107 mEq/L (ref 96–112)
Creatinine, Ser: 0.65 mg/dL (ref 0.40–1.20)
GFR: 118.45 mL/min (ref >60.00)
Glucose, Bld: 96 mg/dL (ref 70–99)
Potassium: 4.1 mEq/L (ref 3.5–5.1)
Sodium: 139 mEq/L (ref 135–145)

## 2015-04-03 LAB — FERRITIN: Ferritin: 8.1 ng/mL — ABNORMAL LOW (ref 10.0–291.0)

## 2015-04-03 LAB — CBC WITH DIFFERENTIAL/PLATELET
Basophils Absolute: 0 10*3/uL (ref 0.0–0.1)
Basophils Relative: 0.5 % (ref 0.0–3.0)
Eosinophils Absolute: 0.1 10*3/uL (ref 0.0–0.7)
Eosinophils Relative: 2.4 % (ref 0.0–5.0)
HCT: 34.5 % — ABNORMAL LOW (ref 36.0–46.0)
Hemoglobin: 11.2 g/dL — ABNORMAL LOW (ref 12.0–15.0)
Lymphocytes Relative: 40.4 % (ref 12.0–46.0)
Lymphs Abs: 2.5 10*3/uL (ref 0.7–4.0)
MCHC: 32.5 g/dL (ref 30.0–36.0)
MCV: 83.3 fl (ref 78.0–100.0)
Monocytes Absolute: 0.3 10*3/uL (ref 0.1–1.0)
Monocytes Relative: 5.6 % (ref 3.0–12.0)
Neutro Abs: 3.1 10*3/uL (ref 1.4–7.7)
Neutrophils Relative %: 51.1 % (ref 43.0–77.0)
Platelets: 240 10*3/uL (ref 150.0–400.0)
RBC: 4.14 Mil/uL (ref 3.87–5.11)
RDW: 15.8 % — ABNORMAL HIGH (ref 11.5–15.5)
WBC: 6.1 10*3/uL (ref 4.0–10.5)

## 2015-04-03 LAB — HEPATIC FUNCTION PANEL
ALT: 17 U/L (ref 0–35)
AST: 19 U/L (ref 0–37)
Albumin: 4 g/dL (ref 3.5–5.2)
Alkaline Phosphatase: 148 U/L — ABNORMAL HIGH (ref 39–117)
Bilirubin, Direct: 0.1 mg/dL (ref 0.0–0.3)
Total Bilirubin: 0.4 mg/dL (ref 0.2–1.2)
Total Protein: 7.6 g/dL (ref 6.0–8.3)

## 2015-04-03 MED ORDER — LISINOPRIL 10 MG PO TABS: 10.0000 mg | ORAL_TABLET | Freq: Every day | ORAL | Status: DC

## 2015-04-03 NOTE — Progress Notes (Signed)
Pre-visit discussion using our clinic review tool. No additional management support is needed unless otherwise documented below in the visit note.  

## 2015-04-03 NOTE — Progress Notes (Signed)
[name], female   DOB: 1950-09-14, 65 y.o.   MRN: [id number]   Subjective:    [name], female    DOB: 28-Jan-1950, 65 y.o.   MRN: [id number]  HPI  Patient with past history of hypertension and anemia.  She comes in today for her complete physical exam.  Last visit, we added lisinopril.  She misunderstood and stopped the hctz.  She has only been taking lisinopril.  States blood pressure ok.  No chest pain.  No sob.  No acid reflux.  No abdominal pain or cramping.  Bowels stable.  Discussed diet and exercise.  Is s/p right knee surgery.  Doing well from her surgery.  She is having increased left knee pain.  Request referral to ortho for evaluation of her left knee.     Past Medical History  Diagnosis Date  . Hypertension   . Anemia   . History of colon polyps    Past Surgical History  Procedure Laterality Date  . Tubal ligation  1976  . Cholecystectomy  1989  . Gastric bypass  03/21/03   Family History  Problem Relation Age of Onset  . Hypertension Mother   . Diabetes Mother   . Cancer Maternal Aunt     question of type  . Cancer Maternal Grandmother     colon   Social History   Social History  . Marital Status: Married    Spouse Name: N/A  . Number of Children: 2  . Years of Education: N/A   Occupational History  .     Social History Main Topics  . Smoking status: Never Smoker   . Smokeless tobacco: Never Used  . Alcohol Use: No  . Drug Use: No  . Sexual Activity: Not Asked   Other Topics Concern  . None   Social History Narrative    Outpatient Encounter Prescriptions as of 04/03/2015  Medication Sig  . ferrous sulfate 325 (65 FE) MG tablet Take 1 tablet (325 mg total) by mouth daily with breakfast.  . lisinopril (PRINIVIL,ZESTRIL) 10 MG tablet Take 1 tablet (10 mg total) by mouth daily.  . [DISCONTINUED] hydrochlorothiazide (HYDRODIURIL) 25 MG tablet Take 1 tablet (25 mg total) by mouth daily.  . [DISCONTINUED] lisinopril  (PRINIVIL,ZESTRIL) 10 MG tablet Take 1 tablet (10 mg total) by mouth daily.   No facility-administered encounter medications on file as of 04/03/2015.    Review of Systems  Constitutional: Negative for appetite change and unexpected weight change.  HENT: Negative for congestion and sinus pressure.   Eyes: Negative for pain and visual disturbance.  Respiratory: Negative for cough, chest tightness and shortness of breath.   Cardiovascular: Negative for chest pain, palpitations and leg swelling.  Gastrointestinal: Negative for nausea, vomiting, abdominal pain and diarrhea.  Genitourinary: Negative for dysuria and difficulty urinating.  Musculoskeletal: Negative for back pain.       Increased left knee pain.    Skin: Negative for color change and rash.  Neurological: Negative for dizziness, light-headedness and headaches.  Hematological: Negative for adenopathy. Does not bruise/bleed easily.  Psychiatric/Behavioral: Negative for dysphoric mood and agitation.       Objective:    Physical Exam  Constitutional: She is oriented to person, place, and time. She appears well-developed and well-nourished. No distress.  HENT:  Nose: Nose normal.  Mouth/Throat: Oropharynx is clear and moist.  Eyes: Right eye exhibits no discharge. Left eye exhibits no discharge. No scleral icterus.  Neck: Neck supple.  No thyromegaly present.  Cardiovascular: Normal rate and regular rhythm.   Pulmonary/Chest: Breath sounds normal. No accessory muscle usage. No tachypnea. No respiratory distress. She has no decreased breath sounds. She has no wheezes. She has no rhonchi. Right breast exhibits no inverted nipple, no mass, no nipple discharge and no tenderness (no axillary adenopathy). Left breast exhibits no inverted nipple, no mass, no nipple discharge and no tenderness (no axilarry adenopathy).  Abdominal: Soft. Bowel sounds are normal. There is no tenderness.  Musculoskeletal: She exhibits no edema or tenderness.    Lymphadenopathy:    She has no cervical adenopathy.  Neurological: She is alert and oriented to person, place, and time.  Skin: Skin is warm. No rash noted. No erythema.  Psychiatric: She has a normal mood and affect. Her behavior is normal.    BP 130/82 mmHg  Pulse 81  Temp(Src) 97.4 F (36.3 C) (Oral)  Resp 18  Ht 5\' 2"  (1.575 m)  Wt 221 lb 4 oz (100.358 kg)  BMI 40.46 kg/m2  SpO2 98%  LMP 04/06/2009 Wt Readings from Last 3 Encounters:  04/03/15 221 lb 4 oz (100.358 kg)  10/25/14 219 lb 8 oz (99.565 kg)  03/24/14 219 lb 6.4 oz (99.519 kg)     Lab Results  Component Value Date   WBC 6.1 04/03/2015   HGB 11.2* 04/03/2015   HCT 34.5* 04/03/2015   PLT 240.0 04/03/2015   GLUCOSE 96 04/03/2015   CHOL 90 10/25/2014   TRIG 53.0 10/25/2014   HDL 53.00 10/25/2014   LDLCALC 27 10/25/2014   ALT 17 04/03/2015   AST 19 04/03/2015   NA 139 04/03/2015   K 4.1 04/03/2015   CL 107 04/03/2015   CREATININE 0.65 04/03/2015   BUN 12 04/03/2015   CO2 24 04/03/2015   TSH 0.89 10/25/2014   INR 1.1 04/20/2014    Mm Digital Screening Bilateral  11/04/2014  CLINICAL DATA:  Screening. EXAM: DIGITAL SCREENING BILATERAL MAMMOGRAM WITH CAD COMPARISON:  Previous exam(s). ACR Breast Density Category b: There are scattered areas of fibroglandular density. FINDINGS: There are no findings suspicious for malignancy. Images were processed with CAD. IMPRESSION: No mammographic evidence of malignancy. A result letter of this screening mammogram will be mailed directly to the patient. RECOMMENDATION: Screening mammogram in one year. (Code:SM-B-01Y) BI-RADS CATEGORY  1: Negative. Electronically Signed   By: Fidela Salisbury M.D.   On: 11/04/2014 09:54       Assessment & Plan:   Problem List Items Addressed This Visit    Anemia   Relevant Orders   CBC with Differential/Platelet (Completed)   Ferritin (Completed)   Elevated alkaline phosphatase level    Had been fractionated.  Higher percent  bone.  Had bone scan.  No acute abnormality to explain results.  Recheck today.  Discussed with hematology.  Recommended since stable, no further w/up.        Relevant Orders   Hepatic function panel (Completed)   Essential hypertension, benign - Primary    Blood pressure is doing well on lisinopril only.  Continue lisinopril.  Remain off hctz.  Follow pressures.  Check metabolic panel.        Relevant Medications   lisinopril (PRINIVIL,ZESTRIL) 10 MG tablet   Other Relevant Orders   Basic metabolic panel (Completed)   Health care maintenance    Physical today 04/03/15.  Colonoscopy 06/2011 - internal hemorrhoids.  PAP 02/24/13 - negative with negative HPV.  Mammogram 11/04/14 - Birads I.  Iron deficiency    Recheck cbc/ferritin.        Knee pain, bilateral   Left knee pain    Persistent left knee pain.  Saw Dr Rudene Christians for her right knee and is s/p right knee surgery.  Right knee doing ok.  Refer back to ortho for left knee evaluation.        Relevant Orders   Ambulatory referral to Orthopedic Surgery   Personal history of colonic polyps    Colonoscopy 06/2011 - internal hemorrhoids.            Einar Pheasant, MD

## 2015-04-05 ENCOUNTER — Encounter: Payer: Self-pay | Admitting: *Deleted

## 2015-04-06 ENCOUNTER — Encounter: Payer: Self-pay | Admitting: Internal Medicine

## 2015-04-06 NOTE — Assessment & Plan Note (Signed)
Had been fractionated.  Higher percent bone.  Had bone scan.  No acute abnormality to explain results.  Recheck today.  Discussed with hematology.  Recommended since stable, no further w/up.

## 2015-04-06 NOTE — Assessment & Plan Note (Signed)
Blood pressure is doing well on lisinopril only.  Continue lisinopril.  Remain off hctz.  Follow pressures.  Check metabolic panel.

## 2015-04-06 NOTE — Assessment & Plan Note (Signed)
Physical today 04/03/15.  Colonoscopy 06/2011 - internal hemorrhoids.  PAP 02/24/13 - negative with negative HPV.  Mammogram 11/04/14 - Birads I.

## 2015-04-06 NOTE — Assessment & Plan Note (Signed)
Recheck cbc/ferritin.

## 2015-04-06 NOTE — Assessment & Plan Note (Signed)
Colonoscopy 06/2011 - internal hemorrhoids.

## 2015-04-06 NOTE — Assessment & Plan Note (Signed)
Persistent left knee pain.  Saw Dr Rudene Christians for her right knee and is s/p right knee surgery.  Right knee doing ok.  Refer back to ortho for left knee evaluation.

## 2015-08-03 ENCOUNTER — Ambulatory Visit (INDEPENDENT_AMBULATORY_CARE_PROVIDER_SITE_OTHER): Payer: 59

## 2015-08-03 ENCOUNTER — Ambulatory Visit (INDEPENDENT_AMBULATORY_CARE_PROVIDER_SITE_OTHER): Payer: 59 | Admitting: Internal Medicine

## 2015-08-03 ENCOUNTER — Encounter: Payer: Self-pay | Admitting: Internal Medicine

## 2015-08-03 VITALS — BP 140/80 | HR 83 | Temp 97.8°F | Resp 18 | Ht 62.0 in | Wt 225.1 lb

## 2015-08-03 DIAGNOSIS — I10 Essential (primary) hypertension: Secondary | ICD-10-CM | POA: Diagnosis not present

## 2015-08-03 DIAGNOSIS — R748 Abnormal levels of other serum enzymes: Secondary | ICD-10-CM | POA: Diagnosis not present

## 2015-08-03 DIAGNOSIS — R05 Cough: Secondary | ICD-10-CM

## 2015-08-03 DIAGNOSIS — R059 Cough, unspecified: Secondary | ICD-10-CM

## 2015-08-03 DIAGNOSIS — D649 Anemia, unspecified: Secondary | ICD-10-CM | POA: Diagnosis not present

## 2015-08-03 DIAGNOSIS — Z1239 Encounter for other screening for malignant neoplasm of breast: Secondary | ICD-10-CM

## 2015-08-03 DIAGNOSIS — Z86010 Personal history of colonic polyps: Secondary | ICD-10-CM

## 2015-08-03 DIAGNOSIS — E611 Iron deficiency: Secondary | ICD-10-CM

## 2015-08-03 LAB — CBC WITH DIFFERENTIAL/PLATELET
Basophils Absolute: 0 10*3/uL (ref 0.0–0.1)
Basophils Relative: 0.4 % (ref 0.0–3.0)
Eosinophils Absolute: 0.2 10*3/uL (ref 0.0–0.7)
Eosinophils Relative: 4 % (ref 0.0–5.0)
HCT: 34.4 % — ABNORMAL LOW (ref 36.0–46.0)
Hemoglobin: 11.2 g/dL — ABNORMAL LOW (ref 12.0–15.0)
Lymphocytes Relative: 31.9 % (ref 12.0–46.0)
Lymphs Abs: 1.8 10*3/uL (ref 0.7–4.0)
MCHC: 32.5 g/dL (ref 30.0–36.0)
MCV: 84.4 fl (ref 78.0–100.0)
Monocytes Absolute: 0.4 10*3/uL (ref 0.1–1.0)
Monocytes Relative: 7 % (ref 3.0–12.0)
Neutro Abs: 3.2 10*3/uL (ref 1.4–7.7)
Neutrophils Relative %: 56.7 % (ref 43.0–77.0)
Platelets: 213 10*3/uL (ref 150.0–400.0)
RBC: 4.08 Mil/uL (ref 3.87–5.11)
RDW: 16.2 % — ABNORMAL HIGH (ref 11.5–15.5)
WBC: 5.7 10*3/uL (ref 4.0–10.5)

## 2015-08-03 LAB — HEPATIC FUNCTION PANEL
ALT: 17 U/L (ref 0–35)
AST: 22 U/L (ref 0–37)
Albumin: 3.8 g/dL (ref 3.5–5.2)
Alkaline Phosphatase: 152 U/L — ABNORMAL HIGH (ref 39–117)
Bilirubin, Direct: 0.1 mg/dL (ref 0.0–0.3)
Total Bilirubin: 0.5 mg/dL (ref 0.2–1.2)
Total Protein: 7.6 g/dL (ref 6.0–8.3)

## 2015-08-03 LAB — BASIC METABOLIC PANEL
BUN: 14 mg/dL (ref 6–23)
CO2: 27 mEq/L (ref 19–32)
Calcium: 9.2 mg/dL (ref 8.4–10.5)
Chloride: 108 mEq/L (ref 96–112)
Creatinine, Ser: 0.67 mg/dL (ref 0.40–1.20)
GFR: 114.26 mL/min (ref >60.00)
Glucose, Bld: 95 mg/dL (ref 70–99)
Potassium: 4.2 mEq/L (ref 3.5–5.1)
Sodium: 141 mEq/L (ref 135–145)

## 2015-08-03 LAB — TSH: TSH: 1.63 u[IU]/mL (ref 0.35–4.50)

## 2015-08-03 LAB — FERRITIN: Ferritin: 12.7 ng/mL (ref 10.0–291.0)

## 2015-08-03 MED ORDER — PREDNISONE 10 MG PO TABS: ORAL_TABLET | ORAL | Status: DC

## 2015-08-03 NOTE — Progress Notes (Signed)
Pre-visit discussion using our clinic review tool. No additional management support is needed unless otherwise documented below in the visit note.  

## 2015-08-03 NOTE — Patient Instructions (Addendum)
Robitussin DM - twice a day as needed.    Saline nasal spray - flush nose at least 2-3x/day  nasacort nasal spray - 2 sprays each nostril one time per day.  Do this in the evening.    Prednisone taper as directed.

## 2015-08-03 NOTE — Progress Notes (Signed)
Patient ID: AMILLIANNA REULE, female   DOB: 1950/01/31, 65 y.o.   MRN: [id number]   Subjective:    Patient ID: [name], female    DOB: 1950/05/03, 65 y.o.   MRN: [id number]  HPI  Patient here for a scheduled follow up.   She has noticed increased cough for the past week.  Some increased drainage.  No sore throat.  No fever.  No vomiting or diarrhea.  No abdominal pain or cramping.  Bowels stable.     Past Medical History  Diagnosis Date  . Hypertension   . Anemia   . History of colon polyps    Past Surgical History  Procedure Laterality Date  . Tubal ligation  1976  . Cholecystectomy  1989  . Gastric bypass  03/21/03   Family History  Problem Relation Age of Onset  . Hypertension Mother   . Diabetes Mother   . Cancer Maternal Aunt     question of type  . Cancer Maternal Grandmother     colon   Social History   Social History  . Marital Status: Married    Spouse Name: N/A  . Number of Children: 2  . Years of Education: N/A   Occupational History  .     Social History Main Topics  . Smoking status: Never Smoker   . Smokeless tobacco: Never Used  . Alcohol Use: No  . Drug Use: No  . Sexual Activity: Not Asked   Other Topics Concern  . None   Social History Narrative    Outpatient Encounter Prescriptions as of 08/03/2015  Medication Sig  . ferrous sulfate 325 (65 FE) MG tablet Take 1 tablet (325 mg total) by mouth daily with breakfast.  . lisinopril (PRINIVIL,ZESTRIL) 10 MG tablet Take 1 tablet (10 mg total) by mouth daily.  . predniSONE (DELTASONE) 10 MG tablet Take 6 tablets x 1 day and then decrease by 1/2 tablet per day until down to zero mg.   No facility-administered encounter medications on file as of 08/03/2015.    Review of Systems  Constitutional: Negative for appetite change and unexpected weight change.  HENT: Positive for postnasal drip. Negative for congestion, sinus pressure and sore throat.   Respiratory: Positive for cough. Negative for  chest tightness and shortness of breath.   Cardiovascular: Negative for chest pain, palpitations and leg swelling.  Gastrointestinal: Negative for nausea, vomiting, abdominal pain and diarrhea.  Genitourinary: Negative for dysuria and difficulty urinating.  Musculoskeletal: Negative for myalgias and back pain.  Skin: Negative for color change and rash.  Neurological: Negative for dizziness, light-headedness and headaches.  Psychiatric/Behavioral: Negative for dysphoric mood and agitation.       Objective:     Blood pressure rechecked by me:  130/82  Physical Exam  Constitutional: She appears well-developed and well-nourished. No distress.  HENT:  Nose: Nose normal.  Mouth/Throat: Oropharynx is clear and moist.  Neck: Neck supple. No thyromegaly present.  Cardiovascular: Normal rate and regular rhythm.   Pulmonary/Chest: Breath sounds normal. No respiratory distress.  Increased cough with expiration and forced expiration.  Abdominal: Soft. Bowel sounds are normal. There is no tenderness.  Musculoskeletal: She exhibits no edema or tenderness.  Lymphadenopathy:    She has no cervical adenopathy.  Skin: No rash noted. No erythema.  Psychiatric: She has a normal mood and affect. Her behavior is normal.    BP 140/80 mmHg  Pulse 83  Temp(Src) 97.8 F (36.6 C) (Oral)  Resp 18  Ht  5\' 2"  (1.575 m)  Wt 225 lb 2 oz (102.116 kg)  BMI 41.17 kg/m2  SpO2 96%  LMP 04/06/2009 Wt Readings from Last 3 Encounters:  08/03/15 225 lb 2 oz (102.116 kg)  04/03/15 221 lb 4 oz (100.358 kg)  10/25/14 219 lb 8 oz (99.565 kg)     Lab Results  Component Value Date   WBC 5.7 08/03/2015   HGB 11.2* 08/03/2015   HCT 34.4* 08/03/2015   PLT 213.0 08/03/2015   GLUCOSE 95 08/03/2015   CHOL 90 10/25/2014   TRIG 53.0 10/25/2014   HDL 53.00 10/25/2014   LDLCALC 27 10/25/2014   ALT 17 08/03/2015   AST 22 08/03/2015   NA 141 08/03/2015   K 4.2 08/03/2015   CL 108 08/03/2015   CREATININE 0.67  08/03/2015   BUN 14 08/03/2015   CO2 27 08/03/2015   TSH 1.63 08/03/2015   INR 1.1 04/20/2014    Mm Digital Screening Bilateral  11/04/2014  CLINICAL DATA:  Screening. EXAM: DIGITAL SCREENING BILATERAL MAMMOGRAM WITH CAD COMPARISON:  Previous exam(s). ACR Breast Density Category b: There are scattered areas of fibroglandular density. FINDINGS: There are no findings suspicious for malignancy. Images were processed with CAD. IMPRESSION: No mammographic evidence of malignancy. A result letter of this screening mammogram will be mailed directly to the patient. RECOMMENDATION: Screening mammogram in one year. (Code:SM-B-01Y) BI-RADS CATEGORY  1: Negative. Electronically Signed   By: Fidela Salisbury M.D.   On: 11/04/2014 09:54       Assessment & Plan:   Problem List Items Addressed This Visit    Anemia    Refer to GI for further evaluation of iron deficient anemia.        Relevant Orders   CBC with Differential/Platelet (Completed)   Ferritin (Completed)   Cough - Primary    Increased cough and congestion as outlined.  Check cxr.  Treat with prednisone taper as directed.  Hold abx until see cxr results.  Saline nasal spray and steroid nasal spray as directed.  Robitussin DM as directed.  Follow.       Relevant Orders   DG Chest 2 View (Completed)   Elevated alkaline phosphatase level    Had been fractionated.  Higher percent bone.  Had bone scan.  No acute abnormality to explain results.  Discussed with hematology.  Recommended since stable, no further w/up.  Recheck alkaline phos.        Relevant Orders   Hepatic function panel (Completed)   Essential hypertension, benign    Blood pressure under good control.  Continue same medication regimen.  Follow pressures.  Follow metabolic panel.        Relevant Orders   TSH (Completed)   Basic metabolic panel (Completed)   Iron deficiency    Follow cbc and ferritin.  Discussed referral to GI for evaluation.  She agrees.         Personal history of colonic polyps    Colonoscopy 06/2011 - internal hemorrhoids.  Follow.        Other Visit Diagnoses    Screening breast examination        Relevant Orders    MM DIGITAL SCREENING BILATERAL        Einar Pheasant, MD

## 2015-08-04 ENCOUNTER — Other Ambulatory Visit: Payer: Self-pay | Admitting: *Deleted

## 2015-08-04 MED ORDER — CEFDINIR 300 MG PO CAPS: 300.0000 mg | ORAL_CAPSULE | Freq: Two times a day (BID) | ORAL | Status: DC

## 2015-08-04 NOTE — Telephone Encounter (Signed)
Abx sent to local pharmacy & pt aware.

## 2015-08-06 ENCOUNTER — Encounter: Payer: Self-pay | Admitting: Internal Medicine

## 2015-08-06 NOTE — Assessment & Plan Note (Signed)
Blood pressure under good control.  Continue same medication regimen.  Follow pressures.  Follow metabolic panel.   

## 2015-08-06 NOTE — Assessment & Plan Note (Signed)
Had been fractionated.  Higher percent bone.  Had bone scan.  No acute abnormality to explain results.  Discussed with hematology.  Recommended since stable, no further w/up.  Recheck alkaline phos.

## 2015-08-06 NOTE — Assessment & Plan Note (Signed)
Colonoscopy 06/2011 - internal hemorrhoids.  Follow.

## 2015-08-06 NOTE — Assessment & Plan Note (Signed)
Increased cough and congestion as outlined.  Check cxr.  Treat with prednisone taper as directed.  Hold abx until see cxr results.  Saline nasal spray and steroid nasal spray as directed.  Robitussin DM as directed.  Follow.

## 2015-08-06 NOTE — Assessment & Plan Note (Signed)
Follow cbc and ferritin.  Discussed referral to GI for evaluation.  She agrees.

## 2015-08-06 NOTE — Assessment & Plan Note (Signed)
Refer to GI for further evaluation of iron deficient anemia.

## 2015-08-31 ENCOUNTER — Ambulatory Visit (INDEPENDENT_AMBULATORY_CARE_PROVIDER_SITE_OTHER): Payer: 59

## 2015-08-31 ENCOUNTER — Ambulatory Visit (INDEPENDENT_AMBULATORY_CARE_PROVIDER_SITE_OTHER): Payer: 59 | Admitting: Internal Medicine

## 2015-08-31 ENCOUNTER — Encounter: Payer: Self-pay | Admitting: Internal Medicine

## 2015-08-31 DIAGNOSIS — R938 Abnormal findings on diagnostic imaging of other specified body structures: Secondary | ICD-10-CM

## 2015-08-31 DIAGNOSIS — D649 Anemia, unspecified: Secondary | ICD-10-CM

## 2015-08-31 DIAGNOSIS — R918 Other nonspecific abnormal finding of lung field: Secondary | ICD-10-CM | POA: Diagnosis not present

## 2015-08-31 DIAGNOSIS — I10 Essential (primary) hypertension: Secondary | ICD-10-CM | POA: Diagnosis not present

## 2015-08-31 DIAGNOSIS — R9389 Abnormal findings on diagnostic imaging of other specified body structures: Secondary | ICD-10-CM

## 2015-08-31 NOTE — Progress Notes (Signed)
[name], female   DOB: 1950-07-19, 65 y.o.   MRN: [id number]   Subjective:    [name], female    DOB: June 23, 1950, 65 y.o.   MRN: [id number]  HPI  Patient here for a scheduled follow up.  Was evaluated 08/03/15 and was having increased cough and congestion.  See note.  cxr as outlined.  Treated with abx.  She states the cough and congestion have completely cleared now.  Feels good.  Increased stress with financial issues.  Overall she feels she is handling things relatively well.  Desires no further intervention.  No chest pain.  No sob.  No acid reflux.  No abdominal pain or cramping.  Bowels stable.     Past Medical History:  Diagnosis Date  . Anemia   . History of colon polyps   . Hypertension    Past Surgical History:  Procedure Laterality Date  . CHOLECYSTECTOMY  1989  . GASTRIC BYPASS  03/21/03  . TUBAL LIGATION  1976   Family History  Problem Relation Age of Onset  . Hypertension Mother   . Diabetes Mother   . Cancer Maternal Aunt     question of type  . Cancer Maternal Grandmother     colon   Social History   Social History  . Marital status: Married    Spouse name: N/A  . Number of children: 2  . Years of education: N/A   Occupational History  .  [employer]   Social History Main Topics  . Smoking status: Never Smoker  . Smokeless tobacco: Never Used  . Alcohol use No  . Drug use: No  . Sexual activity: Not Asked   Other Topics Concern  . None   Social History Narrative  . None    Outpatient Encounter Prescriptions as of 08/31/2015  Medication Sig  . lisinopril (PRINIVIL,ZESTRIL) 10 MG tablet Take 1 tablet (10 mg total) by mouth daily.  . [DISCONTINUED] cefdinir (OMNICEF) 300 MG capsule Take 1 capsule (300 mg total) by mouth 2 (two) times daily.  . [DISCONTINUED] ferrous sulfate 325 (65 FE) MG tablet Take 1 tablet (325 mg total) by mouth daily with breakfast.  . [DISCONTINUED] predniSONE (DELTASONE) 10 MG tablet Take 6  tablets x 1 day and then decrease by 1/2 tablet per day until down to zero mg.   No facility-administered encounter medications on file as of 08/31/2015.     Review of Systems  Constitutional: Negative for appetite change and unexpected weight change.  HENT: Negative for congestion and sinus pressure.   Respiratory: Negative for cough, chest tightness and shortness of breath.   Cardiovascular: Negative for chest pain, palpitations and leg swelling.  Gastrointestinal: Negative for abdominal pain, diarrhea, nausea and vomiting.  Genitourinary: Negative for difficulty urinating and dysuria.  Musculoskeletal: Negative for back pain and myalgias.  Skin: Negative for color change and rash.  Neurological: Negative for dizziness, light-headedness and headaches.  Psychiatric/Behavioral: Negative for agitation and dysphoric mood.       Objective:     Blood pressure rechecked by me:  138-140/80  Physical Exam  Constitutional: She appears well-developed and well-nourished. No distress.  HENT:  Nose: Nose normal.  Mouth/Throat: Oropharynx is clear and moist.  Neck: Neck supple. No thyromegaly present.  Cardiovascular: Normal rate and regular rhythm.   Pulmonary/Chest: Breath sounds normal. No respiratory distress. She has no wheezes.  Abdominal: Soft. Bowel sounds are normal. There is no tenderness.  Musculoskeletal: She exhibits no  edema or tenderness.  Lymphadenopathy:    She has no cervical adenopathy.  Skin: No rash noted. No erythema.  Psychiatric: She has a normal mood and affect. Her behavior is normal.    BP 138/80   Pulse 92   Temp 97.4 F (36.3 C) (Oral)   Ht 5\' 2"  (1.575 m)   Wt 233 lb 3.2 oz (105.8 kg)   LMP 04/06/2009   SpO2 99%   BMI 42.65 kg/m  Wt Readings from Last 3 Encounters:  08/31/15 233 lb 3.2 oz (105.8 kg)  08/03/15 225 lb 2 oz (102.1 kg)  04/03/15 221 lb 4 oz (100.4 kg)     Lab Results  Component Value Date   WBC 5.7 08/03/2015   HGB 11.2 (L)  08/03/2015   HCT 34.4 (L) 08/03/2015   PLT 213.0 08/03/2015   GLUCOSE 95 08/03/2015   CHOL 90 10/25/2014   TRIG 53.0 10/25/2014   HDL 53.00 10/25/2014   LDLCALC 27 10/25/2014   ALT 17 08/03/2015   AST 22 08/03/2015   NA 141 08/03/2015   K 4.2 08/03/2015   CL 108 08/03/2015   CREATININE 0.67 08/03/2015   BUN 14 08/03/2015   CO2 27 08/03/2015   TSH 1.63 08/03/2015   INR 1.1 04/20/2014    Mm Digital Screening Bilateral  Result Date: 11/04/2014 CLINICAL DATA:  Screening. EXAM: DIGITAL SCREENING BILATERAL MAMMOGRAM WITH CAD COMPARISON:  Previous exam(s). ACR Breast Density Category b: There are scattered areas of fibroglandular density. FINDINGS: There are no findings suspicious for malignancy. Images were processed with CAD. IMPRESSION: No mammographic evidence of malignancy. A result letter of this screening mammogram will be mailed directly to the patient. RECOMMENDATION: Screening mammogram in one year. (Code:SM-B-01Y) BI-RADS CATEGORY  1: Negative. Electronically Signed   By: Fidela Salisbury M.D.   On: 11/04/2014 09:54       Assessment & Plan:   Problem List Items Addressed This Visit    Abnormal CXR    Treated.  Symptoms have resolved.  Recheck cxr to confirm clear.       Relevant Orders   DG Chest 2 View (Completed)   Anemia    Had colonoscopy 2013.  Recommended f/u colonoscopy in five years.  hgb stable on recent check.  Still decreased slightly.  Follow.       Essential hypertension, benign    Blood pressure on my check improved.  She states averaging [id number] systolic.  Will continue current medication regimen.  Follow pressures.  Follow metabolic panel.         Other Visit Diagnoses   None.      Einar Pheasant, MD

## 2015-08-31 NOTE — Progress Notes (Signed)
Pre visit review using our clinic review tool, if applicable. No additional management support is needed unless otherwise documented below in the visit note. 

## 2015-09-01 ENCOUNTER — Encounter: Payer: Self-pay | Admitting: Internal Medicine

## 2015-09-01 NOTE — Assessment & Plan Note (Signed)
Had colonoscopy 2013.  Recommended f/u colonoscopy in five years.  hgb stable on recent check.  Still decreased slightly.  Follow.

## 2015-09-01 NOTE — Assessment & Plan Note (Signed)
Treated.  Symptoms have resolved.  Recheck cxr to confirm clear.

## 2015-09-01 NOTE — Assessment & Plan Note (Signed)
Blood pressure on my check improved.  She states averaging [id number] systolic.  Will continue current medication regimen.  Follow pressures.  Follow metabolic panel.

## 2015-11-06 ENCOUNTER — Other Ambulatory Visit: Payer: Self-pay | Admitting: Internal Medicine

## 2015-11-06 ENCOUNTER — Ambulatory Visit
Admission: RE | Admit: 2015-11-06 | Discharge: 2015-11-06 | Disposition: A | Discharge status: Home / Self Care | Payer: 59 | Source: Ambulatory Visit | Attending: Internal Medicine | Admitting: Internal Medicine

## 2015-11-06 DIAGNOSIS — Z1231 Encounter for screening mammogram for malignant neoplasm of breast: Secondary | ICD-10-CM | POA: Insufficient documentation

## 2015-11-06 DIAGNOSIS — Z1239 Encounter for other screening for malignant neoplasm of breast: Secondary | ICD-10-CM

## 2015-12-04 ENCOUNTER — Ambulatory Visit (INDEPENDENT_AMBULATORY_CARE_PROVIDER_SITE_OTHER): Payer: 59 | Admitting: Internal Medicine

## 2015-12-04 ENCOUNTER — Encounter: Payer: Self-pay | Admitting: Internal Medicine

## 2015-12-04 VITALS — BP 132/80 | HR 78 | Temp 97.7°F | Ht 62.0 in | Wt 236.4 lb

## 2015-12-04 DIAGNOSIS — M25562 Pain in left knee: Secondary | ICD-10-CM | POA: Diagnosis not present

## 2015-12-04 DIAGNOSIS — R748 Abnormal levels of other serum enzymes: Secondary | ICD-10-CM

## 2015-12-04 DIAGNOSIS — E611 Iron deficiency: Secondary | ICD-10-CM | POA: Diagnosis not present

## 2015-12-04 DIAGNOSIS — I10 Essential (primary) hypertension: Secondary | ICD-10-CM | POA: Diagnosis not present

## 2015-12-04 DIAGNOSIS — D649 Anemia, unspecified: Secondary | ICD-10-CM

## 2015-12-04 LAB — CBC WITH DIFFERENTIAL/PLATELET
Basophils Absolute: 0 10*3/uL (ref 0.0–0.1)
Basophils Relative: 0.5 % (ref 0.0–3.0)
Eosinophils Absolute: 0.2 10*3/uL (ref 0.0–0.7)
Eosinophils Relative: 3.2 % (ref 0.0–5.0)
HCT: 33.4 % — ABNORMAL LOW (ref 36.0–46.0)
Hemoglobin: 10.7 g/dL — ABNORMAL LOW (ref 12.0–15.0)
Lymphocytes Relative: 38.6 % (ref 12.0–46.0)
Lymphs Abs: 2.6 10*3/uL (ref 0.7–4.0)
MCHC: 32 g/dL (ref 30.0–36.0)
MCV: 83.3 fl (ref 78.0–100.0)
Monocytes Absolute: 0.4 10*3/uL (ref 0.1–1.0)
Monocytes Relative: 6.6 % (ref 3.0–12.0)
Neutro Abs: 3.5 10*3/uL (ref 1.4–7.7)
Neutrophils Relative %: 51.1 % (ref 43.0–77.0)
Platelets: 228 10*3/uL (ref 150.0–400.0)
RBC: 4.01 Mil/uL (ref 3.87–5.11)
RDW: 16.2 % — ABNORMAL HIGH (ref 11.5–15.5)
WBC: 6.8 10*3/uL (ref 4.0–10.5)

## 2015-12-04 LAB — BASIC METABOLIC PANEL
BUN: 13 mg/dL (ref 6–23)
CO2: 27 mEq/L (ref 19–32)
Calcium: 8.9 mg/dL (ref 8.4–10.5)
Chloride: 108 mEq/L (ref 96–112)
Creatinine, Ser: 0.72 mg/dL (ref 0.40–1.20)
GFR: 105.04 mL/min (ref >60.00)
Glucose, Bld: 93 mg/dL (ref 70–99)
Potassium: 4.2 mEq/L (ref 3.5–5.1)
Sodium: 142 mEq/L (ref 135–145)

## 2015-12-04 LAB — HEPATIC FUNCTION PANEL
ALT: 16 U/L (ref 0–35)
AST: 22 U/L (ref 0–37)
Albumin: 4 g/dL (ref 3.5–5.2)
Alkaline Phosphatase: 192 U/L — ABNORMAL HIGH (ref 39–117)
Bilirubin, Direct: 0 mg/dL (ref 0.0–0.3)
Total Bilirubin: 0.3 mg/dL (ref 0.2–1.2)
Total Protein: 7.3 g/dL (ref 6.0–8.3)

## 2015-12-04 LAB — FERRITIN: Ferritin: 4.8 ng/mL — ABNORMAL LOW (ref 10.0–291.0)

## 2015-12-04 MED ORDER — LISINOPRIL 10 MG PO TABS: 10.0000 mg | ORAL_TABLET | Freq: Every day | ORAL | 2 refills | Status: DC

## 2015-12-04 NOTE — Progress Notes (Signed)
[name], female   DOB: Mar 12, 1950, 65 y.o.   MRN: [id number]   Subjective:    [name], female    DOB: Nov 07, 1950, 65 y.o.   MRN: [id number]  HPI  Patient here for a scheduled follow up.  She reports she is doing relatively well.  States her blood pressure ranges from 120-140/70-80.  Still does not take her blood pressure medication on a daily basis.  May miss 2-3 days per week.  Trying to do better.  Discussed the need to take her blood pressure medication on a regular basis.  No chest pain.  No sob.  We discussed diet and exercise.  Has gained weight.  No acid reflux.  No abdominal pain.  Bowels stable.  She does report increased left knee pain.  Saw ortho previously for her right knee.  S/p surgery.  Now with increased problems with her left knee.  Increased pain if stands for any length of time.     Past Medical History:  Diagnosis Date  . Anemia   . History of colon polyps   . Hypertension    Past Surgical History:  Procedure Laterality Date  . CHOLECYSTECTOMY  1989  . GASTRIC BYPASS  03/21/03  . TUBAL LIGATION  1976   Family History  Problem Relation Age of Onset  . Hypertension Mother   . Diabetes Mother   . Cancer Maternal Aunt     question of type  . Cancer Maternal Grandmother     colon  . Breast cancer Neg Hx    Social History   Social History  . Marital status: Married    Spouse name: N/A  . Number of children: 2  . Years of education: N/A   Occupational History  .  [employer]   Social History Main Topics  . Smoking status: Never Smoker  . Smokeless tobacco: Never Used  . Alcohol use No  . Drug use: No  . Sexual activity: Not Asked   Other Topics Concern  . None   Social History Narrative  . None    Outpatient Encounter Prescriptions as of 12/04/2015  Medication Sig  . lisinopril (PRINIVIL,ZESTRIL) 10 MG tablet Take 1 tablet (10 mg total) by mouth daily.  . [DISCONTINUED] lisinopril (PRINIVIL,ZESTRIL) 10 MG tablet Take  1 tablet (10 mg total) by mouth daily.   No facility-administered encounter medications on file as of 12/04/2015.     Review of Systems  Constitutional: Negative for appetite change and unexpected weight change.  HENT: Negative for congestion and sinus pressure.   Respiratory: Negative for cough, chest tightness and shortness of breath.   Cardiovascular: Negative for chest pain, palpitations and leg swelling.  Gastrointestinal: Negative for abdominal pain, diarrhea, nausea and vomiting.  Genitourinary: Negative for difficulty urinating and dysuria.  Musculoskeletal: Negative for back pain.       Left knee pain as outlined.    Skin: Negative for color change and rash.  Neurological: Negative for dizziness, light-headedness and headaches.  Psychiatric/Behavioral: Negative for agitation and dysphoric mood.       Objective:    Physical Exam  Constitutional: She appears well-developed and well-nourished. No distress.  HENT:  Nose: Nose normal.  Mouth/Throat: Oropharynx is clear and moist.  Neck: Neck supple. No thyromegaly present.  Cardiovascular: Normal rate and regular rhythm.   Pulmonary/Chest: Breath sounds normal. No respiratory distress. She has no wheezes.  Abdominal: Soft. Bowel sounds are normal. There is no tenderness.  Musculoskeletal: She  exhibits no edema or tenderness.  Lymphadenopathy:    She has no cervical adenopathy.  Skin: No rash noted. No erythema.  Psychiatric: She has a normal mood and affect. Her behavior is normal.    BP 132/80   Pulse 78   Temp 97.7 F (36.5 C) (Oral)   Ht 5\' 2"  (1.575 m)   Wt 236 lb 6.4 oz (107.2 kg)   LMP 04/06/2009   SpO2 97%   BMI 43.24 kg/m  Wt Readings from Last 3 Encounters:  12/04/15 236 lb 6.4 oz (107.2 kg)  08/31/15 233 lb 3.2 oz (105.8 kg)  08/03/15 225 lb 2 oz (102.1 kg)     Lab Results  Component Value Date   WBC 6.8 12/04/2015   HGB 10.7 (L) 12/04/2015   HCT 33.4 (L) 12/04/2015   PLT 228.0 12/04/2015    GLUCOSE 93 12/04/2015   CHOL 90 10/25/2014   TRIG 53.0 10/25/2014   HDL 53.00 10/25/2014   LDLCALC 27 10/25/2014   ALT 16 12/04/2015   AST 22 12/04/2015   NA 142 12/04/2015   K 4.2 12/04/2015   CL 108 12/04/2015   CREATININE 0.72 12/04/2015   BUN 13 12/04/2015   CO2 27 12/04/2015   TSH 1.63 08/03/2015   INR 1.1 04/20/2014    Mm Screening Breast Tomo Bilateral  Result Date: 11/06/2015 CLINICAL DATA:  Screening. EXAM: 2D DIGITAL SCREENING BILATERAL MAMMOGRAM WITH CAD AND ADJUNCT TOMO COMPARISON:  Previous exam(s). ACR Breast Density Category b: There are scattered areas of fibroglandular density. FINDINGS: There are no findings suspicious for malignancy. Images were processed with CAD. IMPRESSION: No mammographic evidence of malignancy. A result letter of this screening mammogram will be mailed directly to the patient. RECOMMENDATION: Screening mammogram in one year. (Code:SM-B-01Y) BI-RADS CATEGORY  1: Negative. Electronically Signed   By: Lillia Mountain M.D.   On: 11/06/2015 13:03       Assessment & Plan:   Problem List Items Addressed This Visit    Anemia    GI w/up as outlined.  Due 2018 for colonoscopy.  Recheck cbc and ferritin.        Relevant Orders   CBC with Differential/Platelet (Completed)   Elevated alkaline phosphatase level    Has been fractionated.  Higher percent bone.  Had bone scan previously.  No acute abnormality to explain results.  Discussed with hematology.  Recommended since stable, no further w/up and to follow.  Recheck today.        Relevant Orders   Hepatic function panel (Completed)   Essential hypertension, benign    Blood pressure as outlined.  Discussed with her the importance of taking her medication on a regular basis.  Hold on making changes.  Follow pressures.  Check metabolic panel.        Relevant Medications   lisinopril (PRINIVIL,ZESTRIL) 10 MG tablet   Other Relevant Orders   Basic metabolic panel (Completed)   Iron deficiency     Recheck cbc and ferritin today.  Has seen GI previously.  Had colonoscopy 2013.  Recommended f/u in 2018.        Relevant Orders   Ferritin (Completed)   Left knee pain - Primary    Persistent left knee pain as outlined.  Has previously seen Dr Rudene Christians for her right knee and is s/p surgery.  Right knee doing well.  Left knee persistent pain.  Refer back to ortho for further evaluation.        Relevant Orders   Ambulatory  referral to Orthopedic Surgery       Einar Pheasant, MD

## 2015-12-04 NOTE — Progress Notes (Signed)
Pre visit review using our clinic review tool, if applicable. No additional management support is needed unless otherwise documented below in the visit note. 

## 2015-12-05 ENCOUNTER — Encounter: Payer: Self-pay | Admitting: Internal Medicine

## 2015-12-05 NOTE — Assessment & Plan Note (Signed)
GI w/up as outlined.  Due 2018 for colonoscopy.  Recheck cbc and ferritin.

## 2015-12-05 NOTE — Assessment & Plan Note (Signed)
Has been fractionated.  Higher percent bone.  Had bone scan previously.  No acute abnormality to explain results.  Discussed with hematology.  Recommended since stable, no further w/up and to follow.  Recheck today.

## 2015-12-05 NOTE — Assessment & Plan Note (Signed)
Persistent left knee pain as outlined.  Has previously seen Dr Rudene Christians for her right knee and is s/p surgery.  Right knee doing well.  Left knee persistent pain.  Refer back to ortho for further evaluation.

## 2015-12-05 NOTE — Assessment & Plan Note (Signed)
Recheck cbc and ferritin today.  Has seen GI previously.  Had colonoscopy 2013.  Recommended f/u in 2018.

## 2015-12-05 NOTE — Assessment & Plan Note (Signed)
Blood pressure as outlined.  Discussed with her the importance of taking her medication on a regular basis.  Hold on making changes.  Follow pressures.  Check metabolic panel.

## 2015-12-06 ENCOUNTER — Other Ambulatory Visit: Payer: Self-pay | Admitting: Internal Medicine

## 2015-12-06 DIAGNOSIS — D649 Anemia, unspecified: Secondary | ICD-10-CM

## 2015-12-06 DIAGNOSIS — R748 Abnormal levels of other serum enzymes: Secondary | ICD-10-CM

## 2015-12-06 NOTE — Progress Notes (Signed)
Orders placed for referral to oncology and GI

## 2015-12-11 ENCOUNTER — Encounter: Payer: Self-pay | Admitting: Internal Medicine

## 2015-12-29 ENCOUNTER — Inpatient Hospital Stay: Payer: 59 | Admitting: Hematology and Oncology

## 2015-12-29 NOTE — Progress Notes (Deleted)
[facility] day:  12/29/2015  Chief Complaint: Kelly Wallace is a 65 y.o. female with an elevated alkaline phophatase who is referred by Dr.  Nicki Reaper for assessment and management.  HPI: ***  Alkaline phosphatase has been elevated for at least 4 1/2 years.  Alkaline phosphatase was 181 on 04/06/2012, 179 on 12/22/2012, 153 on 05/26/2013, 162 on 10/25/2014, 148 on 04/03/2015, 152 on 08/03/2015 and 192 on 12/04/2015.  CBC on 12/04/2015 revealed a hematocrit of 33.4, hemoglobin 10.7, MCV 83.3, platelets 228,000, WBC 6800 with an ANC of 3500.  Ferritin was 4.8.    Past Medical History:  Diagnosis Date  . Anemia   . History of colon polyps   . Hypertension     Past Surgical History:  Procedure Laterality Date  . CHOLECYSTECTOMY  1989  . GASTRIC BYPASS  03/21/03  . TUBAL LIGATION  1976    Family History  Problem Relation Age of Onset  . Hypertension Mother   . Diabetes Mother   . Cancer Maternal Aunt     question of type  . Cancer Maternal Grandmother     colon  . Breast cancer Neg Hx     Social History:  reports that she has never smoked. She has never used smokeless tobacco. She reports that she does not drink alcohol or use drugs.  The patient is accompanied by *** alone today.  Allergies: No Known Allergies  Current Medications: Current Outpatient Prescriptions  Medication Sig Dispense Refill  . lisinopril (PRINIVIL,ZESTRIL) 10 MG tablet Take 1 tablet (10 mg total) by mouth daily. 30 tablet 2   No current facility-administered medications for this visit.     Review of Systems:  GENERAL:  Feels good.  Active.  No fevers, sweats or weight loss. PERFORMANCE STATUS (ECOG):  *** HEENT:  No visual changes, runny nose, sore throat, mouth sores or tenderness. Lungs: No shortness of breath or cough.  No hemoptysis. Cardiac:  No chest pain, palpitations, orthopnea, or PND. GI:  No nausea, vomiting, diarrhea, constipation, melena  or hematochezia. GU:  No urgency, frequency, dysuria, or hematuria. Musculoskeletal:  No back pain.  No joint pain.  No muscle tenderness. Extremities:  No pain or swelling. Skin:  No rashes or skin changes. Neuro:  No headache, numbness or weakness, balance or coordination issues. Endocrine:  No diabetes, thyroid issues, hot flashes or night sweats. Psych:  No mood changes, depression or anxiety. Pain:  No focal pain. Review of systems:  All other systems reviewed and found to be negative.   Physical Exam: Last menstrual period 04/06/2009. GENERAL:  Well developed, well nourished, sitting comfortably in the exam room in no acute distress. MENTAL STATUS:  Alert and oriented to person, place and time. HEAD:  *** hair.  Normocephalic, atraumatic, face symmetric, no Cushingoid features. EYES:  *** eyes.  Pupils equal round and reactive to light and accomodation.  No conjunctivitis or scleral icterus. ENT:  Oropharynx clear without lesion.  Tongue normal. Mucous membranes moist.  RESPIRATORY:  Clear to auscultation without rales, wheezes or rhonchi. CARDIOVASCULAR:  Regular rate and rhythm without murmur, rub or gallop. ABDOMEN:  Soft, non-tender, with active bowel sounds, and no hepatosplenomegaly.  No masses. SKIN:  No rashes, ulcers or lesions. EXTREMITIES: No edema, no skin discoloration or tenderness.  No palpable cords. LYMPH NODES: No palpable cervical, supraclavicular, axillary or inguinal adenopathy  NEUROLOGICAL: Unremarkable. PSYCH:  Appropriate.  No visits with results within 3 Day(s) from this  visit.  Latest known visit with results is:  Office Visit on 12/04/2015  Component Date Value Ref Range Status  . WBC 12/04/2015 6.8  4.0 - 10.5 K/uL Final  . RBC 12/04/2015 4.01  3.87 - 5.11 Mil/uL Final  . Hemoglobin 12/04/2015 10.7* 12.0 - 15.0 g/dL Final  . HCT 12/04/2015 33.4* 36.0 - 46.0 % Final  . MCV 12/04/2015 83.3  78.0 - 100.0 fl Final  . MCHC 12/04/2015 32.0  30.0 -  36.0 g/dL Final  . RDW 12/04/2015 16.2* 11.5 - 15.5 % Final  . Platelets 12/04/2015 228.0  150.0 - 400.0 K/uL Final  . Neutrophils Relative % 12/04/2015 51.1  43.0 - 77.0 % Final  . Lymphocytes Relative 12/04/2015 38.6  12.0 - 46.0 % Final  . Monocytes Relative 12/04/2015 6.6  3.0 - 12.0 % Final  . Eosinophils Relative 12/04/2015 3.2  0.0 - 5.0 % Final  . Basophils Relative 12/04/2015 0.5  0.0 - 3.0 % Final  . Neutro Abs 12/04/2015 3.5  1.4 - 7.7 K/uL Final  . Lymphs Abs 12/04/2015 2.6  0.7 - 4.0 K/uL Final  . Monocytes Absolute 12/04/2015 0.4  0.1 - 1.0 K/uL Final  . Eosinophils Absolute 12/04/2015 0.2  0.0 - 0.7 K/uL Final  . Basophils Absolute 12/04/2015 0.0  0.0 - 0.1 K/uL Final  . Ferritin 12/04/2015 4.8* 10.0 - 291.0 ng/mL Final  . Sodium 12/04/2015 142  135 - 145 mEq/L Final  . Potassium 12/04/2015 4.2  3.5 - 5.1 mEq/L Final  . Chloride 12/04/2015 108  96 - 112 mEq/L Final  . CO2 12/04/2015 27  19 - 32 mEq/L Final  . Glucose, Bld 12/04/2015 93  70 - 99 mg/dL Final  . BUN 12/04/2015 13  6 - 23 mg/dL Final  . Creatinine, Ser 12/04/2015 0.72  0.40 - 1.20 mg/dL Final  . Calcium 12/04/2015 8.9  8.4 - 10.5 mg/dL Final  . GFR 12/04/2015 105.04  >60.00 mL/min Final  . Total Bilirubin 12/04/2015 0.3  0.2 - 1.2 mg/dL Final  . Bilirubin, Direct 12/04/2015 0.0  0.0 - 0.3 mg/dL Final  . Alkaline Phosphatase 12/04/2015 192* 39 - 117 U/L Final  . AST 12/04/2015 22  0 - 37 U/L Final  . ALT 12/04/2015 16  0 - 35 U/L Final  . Total Protein 12/04/2015 7.3  6.0 - 8.3 g/dL Final  . Albumin 12/04/2015 4.0  3.5 - 5.2 g/dL Final    Assessment:  ZONNIE VANDENBOSSCHE is a 64 y.o. female ***  Plan: 1. *** 2. *** 3. *** 4. *** 5. ***  Lequita Asal, MD  12/29/2015, 5:55 AM

## 2016-01-10 ENCOUNTER — Other Ambulatory Visit: Payer: Self-pay | Admitting: Orthopedic Surgery

## 2016-01-10 DIAGNOSIS — M25562 Pain in left knee: Secondary | ICD-10-CM | POA: Diagnosis not present

## 2016-01-10 DIAGNOSIS — M1712 Unilateral primary osteoarthritis, left knee: Secondary | ICD-10-CM | POA: Diagnosis not present

## 2016-01-18 ENCOUNTER — Ambulatory Visit
Admission: RE | Admit: 2016-01-18 | Discharge: 2016-01-18 | Disposition: A | Discharge status: Home / Self Care | Payer: 59 | Source: Ambulatory Visit | Attending: Orthopedic Surgery | Admitting: Orthopedic Surgery

## 2016-01-18 DIAGNOSIS — M85662 Other cyst of bone, left lower leg: Secondary | ICD-10-CM | POA: Diagnosis not present

## 2016-01-18 DIAGNOSIS — M25562 Pain in left knee: Secondary | ICD-10-CM | POA: Diagnosis not present

## 2016-01-18 DIAGNOSIS — M1712 Unilateral primary osteoarthritis, left knee: Secondary | ICD-10-CM | POA: Insufficient documentation

## 2016-01-18 DIAGNOSIS — M7989 Other specified soft tissue disorders: Secondary | ICD-10-CM | POA: Diagnosis not present

## 2016-01-19 ENCOUNTER — Inpatient Hospital Stay: Payer: 59 | Admitting: Hematology and Oncology

## 2016-01-19 NOTE — Progress Notes (Deleted)
[facility] day:  01/19/2016  Chief Complaint: Kelly Wallace is a 65 y.o. female with an elevated alkaline phosphatase who is referred in consultation by Dr. Einar Pheasant for assessment and management.  HPI: ***  Alkaline phosphatase has been persistently elevated since at least 04/06/2012. On phosphatase has ranged between 148 - 192 without trend.  Liver function tests on 12/04/2015 revealed a SGOT 22, SGPT 16, alkaline phophatase 192, and bilirubin 0.3.  Fractionated alkaline phosphatase on 01/18/2013 revealed a total alkaline phosphatase of 190 (33-130) with 81% bone and 19% liver.  CBC on 12/04/2015 revealed a hematocrit 33.4, he will 1110.7, MCV 83.3, platelets 228,000, white count 6800 with an ANC of 500. Ferritin was 4.8.  Creatinine was 0.7 to my calcium 8.9 and albumin 4.0.  Left knee CT scan on 01/18/2016 revealed tricompartmental osteoarthritis as described above. Large subchondral cyst in the medial femoral condyle measuring 15 mm x 24 x 27 mm. Large conglomeration of cysts in the medial tibial plateau measuring 2.7 x 2.4 x 1.4 cm  Bone scan on 08/01/2011 field focal linear activity in the region of the L2 vertebral body and nonspecific and possibly related to degenerative changes. Compression fracture could have a similar appearance. Consideration was made for lumbar spine MRI. There was activity in bilateral knees and right foot and ankle likely degenerative.  Past Medical History:  Diagnosis Date  . Anemia   . History of colon polyps   . Hypertension     Past Surgical History:  Procedure Laterality Date  . CHOLECYSTECTOMY  1989  . GASTRIC BYPASS  03/21/03  . TUBAL LIGATION  1976    Family History  Problem Relation Age of Onset  . Hypertension Mother   . Diabetes Mother   . Cancer Maternal Aunt     question of type  . Cancer Maternal Grandmother     colon  . Breast cancer Neg Hx     Social History:  reports that  she has never smoked. She has never used smokeless tobacco. She reports that she does not drink alcohol or use drugs.  The patient is accompanied by *** alone today.  Allergies: No Known Allergies  Current Medications: Current Outpatient Prescriptions  Medication Sig Dispense Refill  . lisinopril (PRINIVIL,ZESTRIL) 10 MG tablet Take 1 tablet (10 mg total) by mouth daily. 30 tablet 2   No current facility-administered medications for this visit.     Review of Systems:  GENERAL:  Feels good.  Active.  No fevers, sweats or weight loss. PERFORMANCE STATUS (ECOG):  *** HEENT:  No visual changes, runny nose, sore throat, mouth sores or tenderness. Lungs: No shortness of breath or cough.  No hemoptysis. Cardiac:  No chest pain, palpitations, orthopnea, or PND. GI:  No nausea, vomiting, diarrhea, constipation, melena or hematochezia. GU:  No urgency, frequency, dysuria, or hematuria. Musculoskeletal:  No back pain.  No joint pain.  No muscle tenderness. Extremities:  No pain or swelling. Skin:  No rashes or skin changes. Neuro:  No headache, numbness or weakness, balance or coordination issues. Endocrine:  No diabetes, thyroid issues, hot flashes or night sweats. Psych:  No mood changes, depression or anxiety. Pain:  No focal pain. Review of systems:  All other systems reviewed and found to be negative.   Physical Exam: Last menstrual period 04/06/2009. GENERAL:  Well developed, well nourished, sitting comfortably in the exam room in no acute distress. MENTAL STATUS:  Alert and oriented to  person, place and time. HEAD:  *** hair.  Normocephalic, atraumatic, face symmetric, no Cushingoid features. EYES:  *** eyes.  Pupils equal round and reactive to light and accomodation.  No conjunctivitis or scleral icterus. ENT:  Oropharynx clear without lesion.  Tongue normal. Mucous membranes moist.  RESPIRATORY:  Clear to auscultation without rales, wheezes or rhonchi. CARDIOVASCULAR:  Regular  rate and rhythm without murmur, rub or gallop. ABDOMEN:  Soft, non-tender, with active bowel sounds, and no hepatosplenomegaly.  No masses. SKIN:  No rashes, ulcers or lesions. EXTREMITIES: No edema, no skin discoloration or tenderness.  No palpable cords. LYMPH NODES: No palpable cervical, supraclavicular, axillary or inguinal adenopathy  NEUROLOGICAL: Unremarkable. PSYCH:  Appropriate.  No visits with results within 3 Day(s) from this visit.  Latest known visit with results is:  Office Visit on 12/04/2015  Component Date Value Ref Range Status  . WBC 12/04/2015 6.8  4.0 - 10.5 K/uL Final  . RBC 12/04/2015 4.01  3.87 - 5.11 Mil/uL Final  . Hemoglobin 12/04/2015 10.7* 12.0 - 15.0 g/dL Final  . HCT 12/04/2015 33.4* 36.0 - 46.0 % Final  . MCV 12/04/2015 83.3  78.0 - 100.0 fl Final  . MCHC 12/04/2015 32.0  30.0 - 36.0 g/dL Final  . RDW 12/04/2015 16.2* 11.5 - 15.5 % Final  . Platelets 12/04/2015 228.0  150.0 - 400.0 K/uL Final  . Neutrophils Relative % 12/04/2015 51.1  43.0 - 77.0 % Final  . Lymphocytes Relative 12/04/2015 38.6  12.0 - 46.0 % Final  . Monocytes Relative 12/04/2015 6.6  3.0 - 12.0 % Final  . Eosinophils Relative 12/04/2015 3.2  0.0 - 5.0 % Final  . Basophils Relative 12/04/2015 0.5  0.0 - 3.0 % Final  . Neutro Abs 12/04/2015 3.5  1.4 - 7.7 K/uL Final  . Lymphs Abs 12/04/2015 2.6  0.7 - 4.0 K/uL Final  . Monocytes Absolute 12/04/2015 0.4  0.1 - 1.0 K/uL Final  . Eosinophils Absolute 12/04/2015 0.2  0.0 - 0.7 K/uL Final  . Basophils Absolute 12/04/2015 0.0  0.0 - 0.1 K/uL Final  . Ferritin 12/04/2015 4.8* 10.0 - 291.0 ng/mL Final  . Sodium 12/04/2015 142  135 - 145 mEq/L Final  . Potassium 12/04/2015 4.2  3.5 - 5.1 mEq/L Final  . Chloride 12/04/2015 108  96 - 112 mEq/L Final  . CO2 12/04/2015 27  19 - 32 mEq/L Final  . Glucose, Bld 12/04/2015 93  70 - 99 mg/dL Final  . BUN 12/04/2015 13  6 - 23 mg/dL Final  . Creatinine, Ser 12/04/2015 0.72  0.40 - 1.20 mg/dL Final  .  Calcium 12/04/2015 8.9  8.4 - 10.5 mg/dL Final  . GFR 12/04/2015 105.04  >60.00 mL/min Final  . Total Bilirubin 12/04/2015 0.3  0.2 - 1.2 mg/dL Final  . Bilirubin, Direct 12/04/2015 0.0  0.0 - 0.3 mg/dL Final  . Alkaline Phosphatase 12/04/2015 192* 39 - 117 U/L Final  . AST 12/04/2015 22  0 - 37 U/L Final  . ALT 12/04/2015 16  0 - 35 U/L Final  . Total Protein 12/04/2015 7.3  6.0 - 8.3 g/dL Final  . Albumin 12/04/2015 4.0  3.5 - 5.2 g/dL Final    Assessment:  XANIYAH DORCH is a 65 y.o. female ***  Plan: 1. *** 2. *** 3. *** 4. *** 5. ***  Lequita Asal, MD  01/19/2016, 5:35 AM

## 2016-01-29 DIAGNOSIS — M1712 Unilateral primary osteoarthritis, left knee: Secondary | ICD-10-CM | POA: Diagnosis not present

## 2016-02-01 ENCOUNTER — Inpatient Hospital Stay: Payer: 59 | Admitting: Hematology and Oncology

## 2016-02-01 NOTE — Progress Notes (Deleted)
[facility] day:  02/01/2016  Chief Complaint: Kelly Wallace is a 65 y.o. female with an elevated alkaline phosphatase who is referred in consultation by Dr. Einar Pheasant for assessment and management.  HPI: ***  Alkaline phosphatase has been persistently elevated since at least 04/06/2012. Alkaline phosphatase has ranged between 148 - 192 without trend.  Liver function tests on 12/04/2015 revealed a SGOT 22, SGPT 16, alkaline phophatase 192, and bilirubin 0.3.  Fractionated alkaline phosphatase on 01/18/2013 revealed a total alkaline phosphatase of 190 (33-130) with 81% bone and 19% liver.  CBC on 12/04/2015 revealed a hematocrit 33.4, he will 1110.7, MCV 83.3, platelets 228,000, white count 6800 with an ANC of 500. Ferritin was 4.8.  Creatinine was 0.7 to my calcium 8.9 and albumin 4.0.  Left knee CT scan on 01/18/2016 revealed tricompartmental osteoarthritis as described above. Large subchondral cyst in the medial femoral condyle measuring 15 mm x 24 x 27 mm. Large conglomeration of cysts in the medial tibial plateau measuring 2.7 x 2.4 x 1.4 cm  Bone scan on 08/01/2011 field focal linear activity in the region of the L2 vertebral body and nonspecific and possibly related to degenerative changes. Compression fracture could have a similar appearance. Consideration was made for lumbar spine MRI. There was activity in bilateral knees and right foot and ankle likely degenerative.  Past Medical History:  Diagnosis Date  . Anemia   . History of colon polyps   . Hypertension     Past Surgical History:  Procedure Laterality Date  . CHOLECYSTECTOMY  1989  . GASTRIC BYPASS  03/21/03  . TUBAL LIGATION  1976    Family History  Problem Relation Age of Onset  . Hypertension Mother   . Diabetes Mother   . Cancer Maternal Aunt     question of type  . Cancer Maternal Grandmother     colon  . Breast cancer Neg Hx     Social History:  reports  that she has never smoked. She has never used smokeless tobacco. She reports that she does not drink alcohol or use drugs.  The patient is accompanied by *** alone today.  Allergies: No Known Allergies  Current Medications: Current Outpatient Prescriptions  Medication Sig Dispense Refill  . lisinopril (PRINIVIL,ZESTRIL) 10 MG tablet Take 1 tablet (10 mg total) by mouth daily. 30 tablet 2   No current facility-administered medications for this visit.     Review of Systems:  GENERAL:  Feels good.  Active.  No fevers, sweats or weight loss. PERFORMANCE STATUS (ECOG):  *** HEENT:  No visual changes, runny nose, sore throat, mouth sores or tenderness. Lungs: No shortness of breath or cough.  No hemoptysis. Cardiac:  No chest pain, palpitations, orthopnea, or PND. GI:  No nausea, vomiting, diarrhea, constipation, melena or hematochezia. GU:  No urgency, frequency, dysuria, or hematuria. Musculoskeletal:  No back pain.  No joint pain.  No muscle tenderness. Extremities:  No pain or swelling. Skin:  No rashes or skin changes. Neuro:  No headache, numbness or weakness, balance or coordination issues. Endocrine:  No diabetes, thyroid issues, hot flashes or night sweats. Psych:  No mood changes, depression or anxiety. Pain:  No focal pain. Review of systems:  All other systems reviewed and found to be negative.   Physical Exam: Last menstrual period 04/06/2009. GENERAL:  Well developed, well nourished, sitting comfortably in the exam room in no acute distress. MENTAL STATUS:  Alert and oriented to  person, place and time. HEAD:  *** hair.  Normocephalic, atraumatic, face symmetric, no Cushingoid features. EYES:  *** eyes.  Pupils equal round and reactive to light and accomodation.  No conjunctivitis or scleral icterus. ENT:  Oropharynx clear without lesion.  Tongue normal. Mucous membranes moist.  RESPIRATORY:  Clear to auscultation without rales, wheezes or rhonchi. CARDIOVASCULAR:   Regular rate and rhythm without murmur, rub or gallop. ABDOMEN:  Soft, non-tender, with active bowel sounds, and no hepatosplenomegaly.  No masses. SKIN:  No rashes, ulcers or lesions. EXTREMITIES: No edema, no skin discoloration or tenderness.  No palpable cords. LYMPH NODES: No palpable cervical, supraclavicular, axillary or inguinal adenopathy  NEUROLOGICAL: Unremarkable. PSYCH:  Appropriate.  No visits with results within 3 Day(s) from this visit.  Latest known visit with results is:  Office Visit on 12/04/2015  Component Date Value Ref Range Status  . WBC 12/04/2015 6.8  4.0 - 10.5 K/uL Final  . RBC 12/04/2015 4.01  3.87 - 5.11 Mil/uL Final  . Hemoglobin 12/04/2015 10.7* 12.0 - 15.0 g/dL Final  . HCT 12/04/2015 33.4* 36.0 - 46.0 % Final  . MCV 12/04/2015 83.3  78.0 - 100.0 fl Final  . MCHC 12/04/2015 32.0  30.0 - 36.0 g/dL Final  . RDW 12/04/2015 16.2* 11.5 - 15.5 % Final  . Platelets 12/04/2015 228.0  150.0 - 400.0 K/uL Final  . Neutrophils Relative % 12/04/2015 51.1  43.0 - 77.0 % Final  . Lymphocytes Relative 12/04/2015 38.6  12.0 - 46.0 % Final  . Monocytes Relative 12/04/2015 6.6  3.0 - 12.0 % Final  . Eosinophils Relative 12/04/2015 3.2  0.0 - 5.0 % Final  . Basophils Relative 12/04/2015 0.5  0.0 - 3.0 % Final  . Neutro Abs 12/04/2015 3.5  1.4 - 7.7 K/uL Final  . Lymphs Abs 12/04/2015 2.6  0.7 - 4.0 K/uL Final  . Monocytes Absolute 12/04/2015 0.4  0.1 - 1.0 K/uL Final  . Eosinophils Absolute 12/04/2015 0.2  0.0 - 0.7 K/uL Final  . Basophils Absolute 12/04/2015 0.0  0.0 - 0.1 K/uL Final  . Ferritin 12/04/2015 4.8* 10.0 - 291.0 ng/mL Final  . Sodium 12/04/2015 142  135 - 145 mEq/L Final  . Potassium 12/04/2015 4.2  3.5 - 5.1 mEq/L Final  . Chloride 12/04/2015 108  96 - 112 mEq/L Final  . CO2 12/04/2015 27  19 - 32 mEq/L Final  . Glucose, Bld 12/04/2015 93  70 - 99 mg/dL Final  . BUN 12/04/2015 13  6 - 23 mg/dL Final  . Creatinine, Ser 12/04/2015 0.72  0.40 - 1.20 mg/dL  Final  . Calcium 12/04/2015 8.9  8.4 - 10.5 mg/dL Final  . GFR 12/04/2015 105.04  >60.00 mL/min Final  . Total Bilirubin 12/04/2015 0.3  0.2 - 1.2 mg/dL Final  . Bilirubin, Direct 12/04/2015 0.0  0.0 - 0.3 mg/dL Final  . Alkaline Phosphatase 12/04/2015 192* 39 - 117 U/L Final  . AST 12/04/2015 22  0 - 37 U/L Final  . ALT 12/04/2015 16  0 - 35 U/L Final  . Total Protein 12/04/2015 7.3  6.0 - 8.3 g/dL Final  . Albumin 12/04/2015 4.0  3.5 - 5.2 g/dL Final    Assessment:  Kelly Wallace is a 66 y.o. female ***  Plan: 1. *** 2. *** 3. *** 4. *** 5. ***  Lequita Asal, MD  02/01/2016, 5:53 AM

## 2016-02-14 ENCOUNTER — Other Ambulatory Visit: Payer: Self-pay

## 2016-02-14 NOTE — Patient Outreach (Signed)
[facility]) Care Management  02/14/2016  Kelly Wallace 11-14-1950 [id number]   Subjective: acknowledges Left knee surgery planned for February 6. Kelly Wallace reports she has had surgery on her right knee in the past.  Objective: per Chart review 65 year old with planned total knee arthroplasty.   Assessment: RNCM received referral per Bary Castilla for presurgical screen. RNCM spoke with Kelly Wallace. Two patient identifiers confirmed. Kelly Wallace reports that her FMLA forms as well as disability paperwork are completed.   Durable medical equipment-She reports she has medical equipment at home from a previous surgery.   Support-Kelly Wallace states her husband and daughter will be at home to assist her and she has transportation post discharge to her pharmacy as well as to her follow up appointments.   Outpatient pharmacy services-Kelly Wallace states she uses a Ambulance person for prescriptions.   Kelly Wallace states it has not been decided which rehabilitation option she will be needing post discharge-pending her progress.   Discussed Indemnity plan-. Kelly Wallace is unsure if she has this as a benefit. RNCM encouraged Kelly Wallace to follow up with the benefits office if she has a question as to if she chose this as a benefit. Advanced directive discussed. Kelly Wallace reports she has been through this type of surgery in the past and denies any questions or concerns. She denies any other medical issues of concern. Denies any community resource needs. She is receptive to a follow up call post surgery.   Plan: Telephonic RNCM will provide transition of care call within 3 business days of notification.Thea Silversmith, RN, MSN, [place] Coordinator Cell: [phone]

## 2016-02-19 DIAGNOSIS — H5213 Myopia, bilateral: Secondary | ICD-10-CM | POA: Diagnosis not present

## 2016-02-21 ENCOUNTER — Encounter
Admission: RE | Admit: 2016-02-21 | Discharge: 2016-02-21 | Disposition: A | Discharge status: Home / Self Care | Payer: 59 | Source: Ambulatory Visit | Attending: Orthopedic Surgery | Admitting: Orthopedic Surgery

## 2016-02-21 DIAGNOSIS — I10 Essential (primary) hypertension: Secondary | ICD-10-CM | POA: Diagnosis not present

## 2016-02-21 DIAGNOSIS — Z01818 Encounter for other preprocedural examination: Secondary | ICD-10-CM | POA: Diagnosis not present

## 2016-02-21 HISTORY — DX: Unspecified osteoarthritis, unspecified site: M19.90

## 2016-02-21 LAB — URINALYSIS, COMPLETE (UACMP) WITH MICROSCOPIC
Bacteria, UA: NONE SEEN
Bilirubin Urine: NEGATIVE
Glucose, UA: NEGATIVE mg/dL
Hgb urine dipstick: NEGATIVE
Ketones, ur: NEGATIVE mg/dL
Leukocytes, UA: NEGATIVE
Nitrite: NEGATIVE
Protein, ur: NEGATIVE mg/dL
Specific Gravity, Urine: 1.017 (ref 1.005–1.030)
pH: 7 (ref 5.0–8.0)

## 2016-02-21 LAB — CBC
HCT: 33.7 % — ABNORMAL LOW (ref 35.0–47.0)
Hemoglobin: 10.8 g/dL — ABNORMAL LOW (ref 12.0–16.0)
MCH: 26.7 pg (ref 26.0–34.0)
MCHC: 32.1 g/dL (ref 32.0–36.0)
MCV: 83.2 fL (ref 80.0–100.0)
Platelets: 223 10*3/uL (ref 150–440)
RBC: 4.05 MIL/uL (ref 3.80–5.20)
RDW: 16.6 % — ABNORMAL HIGH (ref 11.5–14.5)
WBC: 5.3 10*3/uL (ref 3.6–11.0)

## 2016-02-21 LAB — BASIC METABOLIC PANEL
Anion gap: 6 (ref 5–15)
BUN: 13 mg/dL (ref 6–20)
CO2: 25 mmol/L (ref 22–32)
Calcium: 8.1 mg/dL — ABNORMAL LOW (ref 8.9–10.3)
Chloride: 108 mmol/L (ref 101–111)
Creatinine, Ser: 0.6 mg/dL (ref 0.44–1.00)
GFR calc Af Amer: >60 mL/min (ref >60)
GFR calc non Af Amer: >60 mL/min (ref >60)
Glucose, Bld: 97 mg/dL (ref 65–99)
Potassium: 3.7 mmol/L (ref 3.5–5.1)
Sodium: 139 mmol/L (ref 135–145)

## 2016-02-21 LAB — TYPE AND SCREEN
ABO/RH(D): O NEG
Antibody Screen: POSITIVE

## 2016-02-21 LAB — PROTIME-INR
INR: 1.14
Prothrombin Time: 14.7 seconds (ref 11.4–15.2)

## 2016-02-21 LAB — APTT: aPTT: 36 seconds (ref 24–36)

## 2016-02-21 LAB — SURGICAL PCR SCREEN
MRSA, PCR: NEGATIVE
Staphylococcus aureus: NEGATIVE

## 2016-02-21 LAB — SEDIMENTATION RATE: Sed Rate: 36 mm/hr — ABNORMAL HIGH (ref 0–30)

## 2016-02-21 NOTE — Patient Instructions (Signed)
  Your procedure is scheduled on: February 27, 2016 (Tuesday) Report to Same Day Surgery 2nd floor medical mall [facility] Entrance-take elevator on left to 2nd floor.  Check in with surgery information desk.) To find out your arrival time please call [phone] between 1PM - 3PM on February 26, 2016 (Monday)  Remember: Instructions that are not followed completely may result in serious medical risk, up to and including death, or upon the discretion of your surgeon and anesthesiologist your surgery may need to be rescheduled.    _x___ 1. Do not eat food or drink liquids after midnight. No gum chewing or hard candies.     __x__ 2. No Alcohol for 24 hours before or after surgery.   __x__3. No Smoking for 24 prior to surgery.   ____  4. Bring all medications with you on the day of surgery if instructed.    __x__ 5. Notify your doctor if there is any change in your medical condition     (cold, fever, infections).     Do not wear jewelry, make-up, hairpins, clips or nail polish.  Do not wear lotions, powders, or perfumes. You may wear deodorant.  Do not shave 48 hours prior to surgery. Men may shave face and neck.  Do not bring valuables to the hospital.    [facility] is not responsible for any belongings or valuables.               Contacts, dentures or bridgework may not be worn into surgery.  Leave your suitcase in the car. After surgery it may be brought to your room.  For patients admitted to the hospital, discharge time is determined by your treatment team.   Patients discharged the day of surgery will not be allowed to drive home.  You will need someone to drive you home and stay with you the night of your procedure.    Please read over the following fact sheets that you were given:   [facility] Preparing for Surgery and or MRSA Information   _x___ Take these medicines the morning of surgery with A SIP OF WATER:    1. LISINOPRIL  2.  3.  4.  5.  6.  ____Fleets enema  or Magnesium Citrate as directed.   _x___ Use CHG Soap or sage wipes as directed on instruction sheet   ____ Use inhalers on the day of surgery and bring to hospital day of surgery  ____ Stop metformin 2 days prior to surgery    ____ Take 1/2 of usual insulin dose the night before surgery and none on the morning of surgery           _x___ Stop Aspirin, Coumadin, Pllavix ,Eliquis, Effient, or Pradaxa  x__ Stop Anti-inflammatories such as Advil, Aleve, Ibuprofen, Motrin, Naproxen,          Naprosyn, Goodies powders or aspirin products. Ok to take Tylenol.   ____ Stop supplements until after surgery.    ____ Bring C-Pap to the hospital.

## 2016-02-22 LAB — URINE CULTURE: Culture: <10000 — AB

## 2016-02-26 MED ORDER — TRANEXAMIC ACID 1000 MG/10ML IV SOLN
1000.0000 mg | INTRAVENOUS | Status: AC
  Administered 2016-02-27: 1000 mg via INTRAVENOUS
  Filled 2016-02-26: qty 10

## 2016-02-26 MED ORDER — CEFAZOLIN SODIUM-DEXTROSE 2-4 GM/100ML-% IV SOLN
2.0000 g | Freq: Once | INTRAVENOUS | Status: AC
  Administered 2016-02-27: 2 g via INTRAVENOUS

## 2016-02-27 ENCOUNTER — Inpatient Hospital Stay
Admission: RE | Admit: 2016-02-27 | Discharge: 2016-03-01 | DRG: 470 | Disposition: A | Discharge status: Home Health | Payer: 59 | Source: Ambulatory Visit | Attending: Orthopedic Surgery | Admitting: Orthopedic Surgery

## 2016-02-27 ENCOUNTER — Inpatient Hospital Stay: Payer: 59

## 2016-02-27 ENCOUNTER — Encounter: Payer: Self-pay | Admitting: *Deleted

## 2016-02-27 ENCOUNTER — Inpatient Hospital Stay: Payer: 59 | Admitting: Anesthesiology

## 2016-02-27 ENCOUNTER — Encounter
Admission: RE | Disposition: A | Discharge status: Home Health | Payer: Self-pay | Source: Ambulatory Visit | Attending: Orthopedic Surgery

## 2016-02-27 DIAGNOSIS — R262 Difficulty in walking, not elsewhere classified: Secondary | ICD-10-CM

## 2016-02-27 DIAGNOSIS — Z6841 Body Mass Index (BMI) 40.0 and over, adult: Secondary | ICD-10-CM

## 2016-02-27 DIAGNOSIS — Z86010 Personal history of colonic polyps: Secondary | ICD-10-CM | POA: Diagnosis not present

## 2016-02-27 DIAGNOSIS — I10 Essential (primary) hypertension: Secondary | ICD-10-CM | POA: Diagnosis present

## 2016-02-27 DIAGNOSIS — Z79899 Other long term (current) drug therapy: Secondary | ICD-10-CM | POA: Diagnosis not present

## 2016-02-27 DIAGNOSIS — D649 Anemia, unspecified: Secondary | ICD-10-CM | POA: Diagnosis not present

## 2016-02-27 DIAGNOSIS — Z9884 Bariatric surgery status: Secondary | ICD-10-CM

## 2016-02-27 DIAGNOSIS — D62 Acute posthemorrhagic anemia: Secondary | ICD-10-CM | POA: Diagnosis not present

## 2016-02-27 DIAGNOSIS — M1711 Unilateral primary osteoarthritis, right knee: Secondary | ICD-10-CM | POA: Diagnosis not present

## 2016-02-27 DIAGNOSIS — Z96652 Presence of left artificial knee joint: Secondary | ICD-10-CM | POA: Diagnosis not present

## 2016-02-27 DIAGNOSIS — M1712 Unilateral primary osteoarthritis, left knee: Principal | ICD-10-CM | POA: Diagnosis present

## 2016-02-27 DIAGNOSIS — Z96651 Presence of right artificial knee joint: Secondary | ICD-10-CM | POA: Diagnosis present

## 2016-02-27 DIAGNOSIS — Z471 Aftercare following joint replacement surgery: Secondary | ICD-10-CM | POA: Diagnosis not present

## 2016-02-27 DIAGNOSIS — G8918 Other acute postprocedural pain: Secondary | ICD-10-CM

## 2016-02-27 DIAGNOSIS — M6281 Muscle weakness (generalized): Secondary | ICD-10-CM

## 2016-02-27 HISTORY — PX: TOTAL KNEE ARTHROPLASTY: SHX125

## 2016-02-27 LAB — ABO/RH: ABO/RH(D): O NEG

## 2016-02-27 LAB — CBC
HCT: 32.1 % — ABNORMAL LOW (ref 35.0–47.0)
Hemoglobin: 10.5 g/dL — ABNORMAL LOW (ref 12.0–16.0)
MCH: 27.1 pg (ref 26.0–34.0)
MCHC: 32.7 g/dL (ref 32.0–36.0)
MCV: 82.8 fL (ref 80.0–100.0)
Platelets: 207 10*3/uL (ref 150–440)
RBC: 3.87 MIL/uL (ref 3.80–5.20)
RDW: 16.2 % — ABNORMAL HIGH (ref 11.5–14.5)
WBC: 5.8 10*3/uL (ref 3.6–11.0)

## 2016-02-27 LAB — CREATININE, SERUM
Creatinine, Ser: 0.61 mg/dL (ref 0.44–1.00)
GFR calc Af Amer: >60 mL/min (ref >60)
GFR calc non Af Amer: >60 mL/min (ref >60)

## 2016-02-27 SURGERY — ARTHROPLASTY, KNEE, TOTAL
Anesthesia: Spinal | Laterality: Left | Wound class: Clean

## 2016-02-27 MED ORDER — ONDANSETRON HCL 4 MG/2ML IJ SOLN: 4.0000 mg | Freq: Once | INTRAMUSCULAR | Status: DC | PRN

## 2016-02-27 MED ORDER — SODIUM CHLORIDE 0.9 % IV SOLN
INTRAVENOUS | Status: DC | PRN
  Administered 2016-02-27: 1000 mg via INTRAVENOUS

## 2016-02-27 MED ORDER — SODIUM CHLORIDE 0.9 % IJ SOLN
INTRAMUSCULAR | Status: AC
  Filled 2016-02-27: qty 100

## 2016-02-27 MED ORDER — PROPOFOL 10 MG/ML IV BOLUS
INTRAVENOUS | Status: DC | PRN
  Administered 2016-02-27: 30 mg via INTRAVENOUS

## 2016-02-27 MED ORDER — MIDAZOLAM HCL 2 MG/2ML IJ SOLN
INTRAMUSCULAR | Status: AC
  Filled 2016-02-27: qty 2

## 2016-02-27 MED ORDER — ONDANSETRON HCL 4 MG/2ML IJ SOLN: 4.0000 mg | Freq: Four times a day (QID) | INTRAMUSCULAR | Status: DC | PRN

## 2016-02-27 MED ORDER — ZOLPIDEM TARTRATE 5 MG PO TABS: 5.0000 mg | ORAL_TABLET | Freq: Every evening | ORAL | Status: DC | PRN

## 2016-02-27 MED ORDER — DIPHENHYDRAMINE HCL 12.5 MG/5ML PO ELIX: 12.5000 mg | ORAL_SOLUTION | ORAL | Status: DC | PRN

## 2016-02-27 MED ORDER — BUPIVACAINE-EPINEPHRINE (PF) 0.25% -1:200000 IJ SOLN
INTRAMUSCULAR | Status: DC | PRN
  Administered 2016-02-27: 30 mL

## 2016-02-27 MED ORDER — ONDANSETRON HCL 4 MG PO TABS: 4.0000 mg | ORAL_TABLET | Freq: Four times a day (QID) | ORAL | Status: DC | PRN

## 2016-02-27 MED ORDER — ONDANSETRON HCL 4 MG/2ML IJ SOLN
INTRAMUSCULAR | Status: DC | PRN
Start: 2016-02-27 — End: 2016-02-27
  Administered 2016-02-27: 4 mg via INTRAVENOUS

## 2016-02-27 MED ORDER — BISACODYL 10 MG RE SUPP
10.0000 mg | Freq: Every day | RECTAL | Status: DC | PRN
  Administered 2016-03-01: 10 mg via RECTAL
  Filled 2016-02-27: qty 1

## 2016-02-27 MED ORDER — ONDANSETRON HCL 4 MG/2ML IJ SOLN
INTRAMUSCULAR | Status: AC
  Filled 2016-02-27: qty 2

## 2016-02-27 MED ORDER — FAMOTIDINE 20 MG PO TABS
20.0000 mg | ORAL_TABLET | Freq: Once | ORAL | Status: AC
  Administered 2016-02-27: 20 mg via ORAL

## 2016-02-27 MED ORDER — KETOROLAC TROMETHAMINE 30 MG/ML IJ SOLN
INTRAMUSCULAR | Status: AC
  Filled 2016-02-27: qty 1

## 2016-02-27 MED ORDER — MAGNESIUM HYDROXIDE 400 MG/5ML PO SUSP
30.0000 mL | Freq: Every day | ORAL | Status: DC | PRN
Start: 2016-02-27 — End: 2016-03-01
  Administered 2016-02-28 – 2016-02-29 (×2): 30 mL via ORAL
  Filled 2016-02-27 (×2): qty 30

## 2016-02-27 MED ORDER — SODIUM CHLORIDE 0.9 % IV SOLN: INTRAVENOUS | Status: DC | PRN

## 2016-02-27 MED ORDER — MENTHOL 3 MG MT LOZG
1.0000 | LOZENGE | OROMUCOSAL | Status: DC | PRN
Start: 2016-02-27 — End: 2016-03-01
  Filled 2016-02-27: qty 9

## 2016-02-27 MED ORDER — LISINOPRIL 10 MG PO TABS
10.0000 mg | ORAL_TABLET | Freq: Every day | ORAL | Status: DC
  Administered 2016-02-28 – 2016-03-01 (×3): 10 mg via ORAL
  Filled 2016-02-27 (×3): qty 1

## 2016-02-27 MED ORDER — MORPHINE SULFATE (PF) 2 MG/ML IV SOLN: 2.0000 mg | INTRAVENOUS | Status: DC | PRN

## 2016-02-27 MED ORDER — METOCLOPRAMIDE HCL 5 MG/ML IJ SOLN: 5.0000 mg | Freq: Three times a day (TID) | INTRAMUSCULAR | Status: DC | PRN

## 2016-02-27 MED ORDER — LACTATED RINGERS IV SOLN
INTRAVENOUS | Status: DC
  Administered 2016-02-27: 50 mL/h via INTRAVENOUS
  Administered 2016-02-27 (×2): via INTRAVENOUS

## 2016-02-27 MED ORDER — SODIUM CHLORIDE 0.9 % IV SOLN
INTRAVENOUS | Status: DC
  Administered 2016-02-27 – 2016-02-28 (×2): via INTRAVENOUS

## 2016-02-27 MED ORDER — DOCUSATE SODIUM 100 MG PO CAPS
100.0000 mg | ORAL_CAPSULE | Freq: Two times a day (BID) | ORAL | Status: DC
  Administered 2016-02-27 – 2016-03-01 (×6): 100 mg via ORAL
  Filled 2016-02-27 (×6): qty 1

## 2016-02-27 MED ORDER — SODIUM CHLORIDE 0.9 % IV SOLN
INTRAVENOUS | Status: DC | PRN
  Administered 2016-02-27: 35 ug/min via INTRAVENOUS

## 2016-02-27 MED ORDER — BUPIVACAINE-EPINEPHRINE (PF) 0.25% -1:200000 IJ SOLN
INTRAMUSCULAR | Status: AC
  Filled 2016-02-27: qty 30

## 2016-02-27 MED ORDER — FENTANYL CITRATE (PF) 100 MCG/2ML IJ SOLN: 25.0000 ug | INTRAMUSCULAR | Status: DC | PRN

## 2016-02-27 MED ORDER — METHOCARBAMOL 500 MG PO TABS
500.0000 mg | ORAL_TABLET | Freq: Four times a day (QID) | ORAL | Status: DC | PRN
  Administered 2016-02-28: 500 mg via ORAL
  Filled 2016-02-27: qty 1

## 2016-02-27 MED ORDER — PROPOFOL 10 MG/ML IV BOLUS
INTRAVENOUS | Status: AC
  Filled 2016-02-27: qty 20

## 2016-02-27 MED ORDER — FAMOTIDINE 20 MG PO TABS
ORAL_TABLET | ORAL | Status: AC
  Administered 2016-02-27: 20 mg via ORAL
  Filled 2016-02-27: qty 1

## 2016-02-27 MED ORDER — MORPHINE SULFATE 10 MG/ML IJ SOLN
INTRAMUSCULAR | Status: DC | PRN
  Administered 2016-02-27: 10 mg via SUBCUTANEOUS

## 2016-02-27 MED ORDER — ACETAMINOPHEN 325 MG PO TABS: 650.0000 mg | ORAL_TABLET | Freq: Four times a day (QID) | ORAL | Status: DC | PRN

## 2016-02-27 MED ORDER — CEFAZOLIN SODIUM-DEXTROSE 2-4 GM/100ML-% IV SOLN
2.0000 g | Freq: Four times a day (QID) | INTRAVENOUS | Status: DC
  Filled 2016-02-27 (×3): qty 100

## 2016-02-27 MED ORDER — PROPOFOL 500 MG/50ML IV EMUL
INTRAVENOUS | Status: DC | PRN
  Administered 2016-02-27: 100 ug/kg/min via INTRAVENOUS

## 2016-02-27 MED ORDER — PHENOL 1.4 % MT LIQD
1.0000 | OROMUCOSAL | Status: DC | PRN
Start: 2016-02-27 — End: 2016-03-01
  Filled 2016-02-27: qty 177

## 2016-02-27 MED ORDER — CEFAZOLIN SODIUM-DEXTROSE 2-3 GM-% IV SOLR
2.0000 g | Freq: Four times a day (QID) | INTRAVENOUS | Status: AC
  Administered 2016-02-27 – 2016-02-28 (×3): 2 g via INTRAVENOUS
  Filled 2016-02-27 (×3): qty 50

## 2016-02-27 MED ORDER — ENOXAPARIN SODIUM 30 MG/0.3ML ~~LOC~~ SOLN
30.0000 mg | Freq: Two times a day (BID) | SUBCUTANEOUS | Status: DC
Start: 2016-02-28 — End: 2016-03-01
  Administered 2016-02-28 – 2016-03-01 (×5): 30 mg via SUBCUTANEOUS
  Filled 2016-02-27 (×5): qty 0.3

## 2016-02-27 MED ORDER — MORPHINE SULFATE (PF) 10 MG/ML IV SOLN
INTRAVENOUS | Status: AC
  Filled 2016-02-27: qty 1

## 2016-02-27 MED ORDER — PROPOFOL 500 MG/50ML IV EMUL
INTRAVENOUS | Status: AC
  Filled 2016-02-27: qty 50

## 2016-02-27 MED ORDER — NEOMYCIN-POLYMYXIN B GU 40-200000 IR SOLN
Status: AC
  Filled 2016-02-27: qty 20

## 2016-02-27 MED ORDER — NEOMYCIN-POLYMYXIN B GU 40-200000 IR SOLN
Status: DC | PRN
  Administered 2016-02-27: 16 mL

## 2016-02-27 MED ORDER — MIDAZOLAM HCL 5 MG/5ML IJ SOLN
INTRAMUSCULAR | Status: DC | PRN
  Administered 2016-02-27: 2 mg via INTRAVENOUS

## 2016-02-27 MED ORDER — OXYCODONE HCL 5 MG PO TABS
5.0000 mg | ORAL_TABLET | ORAL | Status: DC | PRN
  Administered 2016-02-27: 10 mg via ORAL
  Administered 2016-02-27 (×2): 5 mg via ORAL
  Administered 2016-02-27 – 2016-02-29 (×10): 10 mg via ORAL
  Administered 2016-03-01: 5 mg via ORAL
  Filled 2016-02-27 (×5): qty 2
  Filled 2016-02-27: qty 1
  Filled 2016-02-27 (×2): qty 2
  Filled 2016-02-27: qty 1
  Filled 2016-02-27 (×5): qty 2

## 2016-02-27 MED ORDER — CEFAZOLIN SODIUM-DEXTROSE 2-4 GM/100ML-% IV SOLN
INTRAVENOUS | Status: AC
  Filled 2016-02-27: qty 100

## 2016-02-27 MED ORDER — MAGNESIUM CITRATE PO SOLN
1.0000 | Freq: Once | ORAL | Status: DC | PRN
  Filled 2016-02-27: qty 296

## 2016-02-27 MED ORDER — KETOROLAC TROMETHAMINE 30 MG/ML IJ SOLN
INTRAMUSCULAR | Status: DC | PRN
  Administered 2016-02-27: 30 mg via INTRA_ARTICULAR

## 2016-02-27 MED ORDER — PHENYLEPHRINE HCL 10 MG/ML IJ SOLN
INTRAMUSCULAR | Status: AC
  Filled 2016-02-27: qty 1

## 2016-02-27 MED ORDER — BUPIVACAINE LIPOSOME 1.3 % IJ SUSP
INTRAMUSCULAR | Status: DC | PRN
  Administered 2016-02-27: 60 mL

## 2016-02-27 MED ORDER — BUPIVACAINE LIPOSOME 1.3 % IJ SUSP
INTRAMUSCULAR | Status: AC
  Filled 2016-02-27: qty 20

## 2016-02-27 MED ORDER — BUPIVACAINE HCL (PF) 0.5 % IJ SOLN
INTRAMUSCULAR | Status: DC | PRN
Start: 2016-02-27 — End: 2016-02-27
  Administered 2016-02-27: 3 mL

## 2016-02-27 MED ORDER — METHOCARBAMOL 1000 MG/10ML IJ SOLN
500.0000 mg | Freq: Four times a day (QID) | INTRAVENOUS | Status: DC | PRN
  Filled 2016-02-27: qty 5

## 2016-02-27 MED ORDER — METOCLOPRAMIDE HCL 10 MG PO TABS: 5.0000 mg | ORAL_TABLET | Freq: Three times a day (TID) | ORAL | Status: DC | PRN

## 2016-02-27 MED ORDER — ACETAMINOPHEN 650 MG RE SUPP: 650.0000 mg | Freq: Four times a day (QID) | RECTAL | Status: DC | PRN

## 2016-02-27 SURGICAL SUPPLY — 61 items
BANDAGE ACE 6X5 VEL STRL LF (GAUZE/BANDAGES/DRESSINGS) ×2 IMPLANT
BLADE SAW 1 (BLADE) ×2 IMPLANT
BLOCK CUTTING FEMUR 3 LT MED (MISCELLANEOUS) IMPLANT
BLOCK CUTTING TIBIAL 4 LT CT (MISCELLANEOUS) IMPLANT
CANISTER SUCT 1200ML W/VALVE (MISCELLANEOUS) ×2 IMPLANT
CANISTER SUCT 3000ML (MISCELLANEOUS) ×4 IMPLANT
CAPT KNEE TOTAL 3 ×2 IMPLANT
CATH FOL LEG HOLDER (MISCELLANEOUS) ×2 IMPLANT
CATH TRAY METER 16FR LF (MISCELLANEOUS) ×2 IMPLANT
CEMENT HV SMART SET (Cement) ×4 IMPLANT
CHLORAPREP W/TINT 26ML (MISCELLANEOUS) ×4 IMPLANT
CONNECTOR OFFSET KNEE 3MM (Knees) ×2 IMPLANT
COOLER POLAR GLACIER W/PUMP (MISCELLANEOUS) ×2 IMPLANT
CUFF TOURN 24 STER (MISCELLANEOUS) IMPLANT
CUFF TOURN 30 STER DUAL PORT (MISCELLANEOUS) IMPLANT
DRAPE INCISE IOBAN 66X45 STRL (DRAPES) ×4 IMPLANT
DRAPE SHEET LG 3/4 BI-LAMINATE (DRAPES) ×4 IMPLANT
ELECT CAUTERY BLADE 6.4 (BLADE) ×2 IMPLANT
ELECT REM PT RETURN 9FT ADLT (ELECTROSURGICAL) ×2
ELECTRODE REM PT RTRN 9FT ADLT (ELECTROSURGICAL) ×1 IMPLANT
GAUZE PETRO XEROFOAM 1X8 (MISCELLANEOUS) ×2 IMPLANT
GAUZE SPONGE 4X4 12PLY STRL (GAUZE/BANDAGES/DRESSINGS) ×2 IMPLANT
GLOVE BIOGEL PI IND STRL 9 (GLOVE) ×1 IMPLANT
GLOVE BIOGEL PI INDICATOR 9 (GLOVE) ×1
GLOVE INDICATOR 8.0 STRL GRN (GLOVE) ×2 IMPLANT
GLOVE SURG ORTHO 8.0 STRL STRW (GLOVE) ×2 IMPLANT
GLOVE SURG SYN 9.0  PF PI (GLOVE) ×1
GLOVE SURG SYN 9.0 PF PI (GLOVE) ×1 IMPLANT
GOWN SRG 2XL LVL 4 RGLN SLV (GOWNS) ×1 IMPLANT
GOWN STRL NON-REIN 2XL LVL4 (GOWNS) ×1
GOWN STRL REUS W/ TWL LRG LVL3 (GOWN DISPOSABLE) ×1 IMPLANT
GOWN STRL REUS W/ TWL XL LVL3 (GOWN DISPOSABLE) ×1 IMPLANT
GOWN STRL REUS W/TWL LRG LVL3 (GOWN DISPOSABLE) ×1
GOWN STRL REUS W/TWL XL LVL3 (GOWN DISPOSABLE) ×1
HANDPIECE INTERPULSE COAX TIP (DISPOSABLE) ×1
HOOD PEEL AWAY FLYTE STAYCOOL (MISCELLANEOUS) ×4 IMPLANT
IMMBOLIZER KNEE 19 BLUE UNIV (SOFTGOODS) ×2 IMPLANT
KIT RM TURNOVER STRD PROC AR (KITS) ×2 IMPLANT
KNEE MEDACTA TIBIAL/FEMORAL BL (Knees) ×2 IMPLANT
KNIFE SCULPS 14X20 (INSTRUMENTS) ×2 IMPLANT
NDL SAFETY 18GX1.5 (NEEDLE) ×2 IMPLANT
NEEDLE SPNL 18GX3.5 QUINCKE PK (NEEDLE) ×2 IMPLANT
NEEDLE SPNL 20GX3.5 QUINCKE YW (NEEDLE) ×2 IMPLANT
NS IRRIG 1000ML POUR BTL (IV SOLUTION) ×2 IMPLANT
PACK TOTAL KNEE (MISCELLANEOUS) ×2 IMPLANT
PAD WRAPON POLAR KNEE (MISCELLANEOUS) ×1 IMPLANT
SET HNDPC FAN SPRY TIP SCT (DISPOSABLE) ×1 IMPLANT
SOL .9 NS 3000ML IRR  AL (IV SOLUTION) ×1
SOL .9 NS 3000ML IRR UROMATIC (IV SOLUTION) ×1 IMPLANT
STAPLER SKIN PROX 35W (STAPLE) ×2 IMPLANT
SUCTION FRAZIER HANDLE 10FR (MISCELLANEOUS) ×1
SUCTION TUBE FRAZIER 10FR DISP (MISCELLANEOUS) ×1 IMPLANT
SUT DVC 2 QUILL PDO  T11 36X36 (SUTURE) ×1
SUT DVC 2 QUILL PDO T11 36X36 (SUTURE) ×1 IMPLANT
SUT V-LOC 90 ABS DVC 3-0 CL (SUTURE) ×2 IMPLANT
SYR 20CC LL (SYRINGE) ×2 IMPLANT
SYR 50ML LL SCALE MARK (SYRINGE) ×4 IMPLANT
TIBIAL BONE MODEL LEFT (MISCELLANEOUS) IMPLANT
TOWEL OR 17X26 4PK STRL BLUE (TOWEL DISPOSABLE) ×2 IMPLANT
TOWER CARTRIDGE SMART MIX (DISPOSABLE) ×2 IMPLANT
WRAPON POLAR PAD KNEE (MISCELLANEOUS) ×2

## 2016-02-27 NOTE — Anesthesia Procedure Notes (Signed)
Spinal  Patient location during procedure: OR Start time: 02/27/2016 9:59 AM Staffing Resident/CRNA: Nelda Marseille Performed: resident/CRNA  Preanesthetic Checklist Completed: patient identified, site marked, surgical consent, pre-op evaluation, timeout performed, IV checked, risks and benefits discussed and monitors and equipment checked Spinal Block Patient position: sitting Prep: Betadine Patient monitoring: heart rate, continuous pulse ox, blood pressure and cardiac monitor Approach: midline Location: L3-4 Injection technique: single-shot Needle Needle type: Whitacre and Introducer  Needle gauge: 25 G Needle length: 9 cm Assessment Sensory level: T10 Additional Notes Negative paresthesia. Negative blood return. Positive free-flowing CSF. Expiration date of kit checked and confirmed. Patient tolerated procedure well, without complications.

## 2016-02-27 NOTE — NC FL2 (Signed)
[place] LEVEL OF CARE SCREENING TOOL     IDENTIFICATION  Patient Name: Kelly Wallace Birthdate: Nov 12, 1950 Sex: female Admission Date (Current Location): 02/27/2016  [place] and [place] Number:  Engineering geologist and Address:  [facility], [address] [place], [place] [address]      Provider Number: [id number]  Attending Physician Name and Address:  Hessie Knows, MD  Relative Name and Phone Number:       Current Level of Care: Hospital Recommended Level of Care: [place] Prior Approval Number:    Date Approved/Denied:   PASRR Number:  ([id number])  Discharge Plan: SNF    Current Diagnoses: Patient Active Problem List   Diagnosis Date Noted  . Primary localized osteoarthritis of left knee 02/27/2016  . Abnormal CXR 08/31/2015  . Cough 08/03/2015  . Left knee pain 04/03/2015  . Health care maintenance 11/04/2014  . Knee pain, bilateral 02/27/2014  . Iron deficiency 03/01/2013  . Nipple discharge 12/26/2012  . Essential hypertension, benign 04/07/2012  . Elevated alkaline phosphatase level 04/07/2012  . Anemia 04/07/2012  . Personal history of colonic polyps 04/07/2012    Orientation RESPIRATION BLADDER Height & Weight     Self, Time, Situation, Place  Normal External catheter Weight: 233 lb (105.7 kg) Height:  5\' 2"  (157.5 cm)  BEHAVIORAL SYMPTOMS/MOOD NEUROLOGICAL BOWEL NUTRITION STATUS   (None. )  (None. ) Incontinent Diet (Diet: Clear Liquid)  AMBULATORY STATUS COMMUNICATION OF NEEDS Skin   Extensive Assist Verbally Surgical wounds (Incision: Left Knee)                       Personal Care Assistance Level of Assistance  Bathing, Feeding, Dressing Bathing Assistance: Limited assistance Feeding assistance: Independent Dressing Assistance: Limited assistance     Functional Limitations Info  Sight, Hearing, Speech Sight Info: Adequate Hearing Info: Adequate Speech Info: Adequate     SPECIAL CARE FACTORS FREQUENCY  PT (By licensed PT), OT (By licensed OT)     PT Frequency:  (5) OT Frequency:  (5)            Contractures      Additional Factors Info  Code Status, Allergies Code Status Info:  (Full Code) Allergies Info:  (No Known Allergies)           Current Medications (02/27/2016):  This is the current hospital active medication list Current Facility-Administered Medications  Medication Dose Route Frequency Provider Last Rate Last Dose  . 0.9 %  sodium chloride infusion   Intravenous Continuous Hessie Knows, MD      . acetaminophen (TYLENOL) tablet 650 mg  650 mg Oral Q6H PRN Hessie Knows, MD       Or  . acetaminophen (TYLENOL) suppository 650 mg  650 mg Rectal Q6H PRN Hessie Knows, MD      . bisacodyl (DULCOLAX) suppository 10 mg  10 mg Rectal Daily PRN Hessie Knows, MD      . ceFAZolin (ANCEF) IVPB 2 g/50 mL premix  2 g Intravenous Q6H Hessie Knows, MD      . diphenhydrAMINE (BENADRYL) 12.5 MG/5ML elixir 12.5-25 mg  12.5-25 mg Oral Q4H PRN Hessie Knows, MD      . docusate sodium (COLACE) capsule 100 mg  100 mg Oral BID Hessie Knows, MD      . Derrill Memo ON 02/28/2016] enoxaparin (LOVENOX) injection 30 mg  30 mg Subcutaneous Q12H Hessie Knows, MD      . lisinopril (PRINIVIL,ZESTRIL)  tablet 10 mg  10 mg Oral Daily Hessie Knows, MD      . magnesium citrate solution 1 Bottle  1 Bottle Oral Once PRN Hessie Knows, MD      . magnesium hydroxide (MILK OF MAGNESIA) suspension 30 mL  30 mL Oral Daily PRN Hessie Knows, MD      . menthol-cetylpyridinium (CEPACOL) lozenge 3 mg  1 lozenge Oral PRN Hessie Knows, MD       Or  . phenol (CHLORASEPTIC) mouth spray 1 spray  1 spray Mouth/Throat PRN Hessie Knows, MD      . methocarbamol (ROBAXIN) tablet 500 mg  500 mg Oral Q6H PRN Hessie Knows, MD       Or  . methocarbamol (ROBAXIN) 500 mg in dextrose 5 % 50 mL IVPB  500 mg Intravenous Q6H PRN Hessie Knows, MD      . metoCLOPramide (REGLAN) tablet 5-10 mg  5-10 mg Oral Q8H  PRN Hessie Knows, MD       Or  . metoCLOPramide (REGLAN) injection 5-10 mg  5-10 mg Intravenous Q8H PRN Hessie Knows, MD      . morphine 2 MG/ML injection 2 mg  2 mg Intravenous Q1H PRN Hessie Knows, MD      . ondansetron [facility]) tablet 4 mg  4 mg Oral Q6H PRN Hessie Knows, MD       Or  . ondansetron [facility]) injection 4 mg  4 mg Intravenous Q6H PRN Hessie Knows, MD      . oxyCODONE (Oxy IR/ROXICODONE) immediate release tablet 5-10 mg  5-10 mg Oral Q3H PRN Hessie Knows, MD      . zolpidem [facility]) tablet 5 mg  5 mg Oral QHS PRN Hessie Knows, MD         Discharge Medications: Please see discharge summary for a list of discharge medications.  Relevant Imaging Results:  Relevant Lab Results:   Additional Information  (SSN: [id number])  Danie Chandler, Student-Social Work

## 2016-02-27 NOTE — Anesthesia Post-op Follow-up Note (Cosign Needed)
Anesthesia QCDR form completed.        

## 2016-02-27 NOTE — Anesthesia Preprocedure Evaluation (Signed)
Anesthesia Evaluation  Patient identified by MRN, date of birth, ID band Patient awake    Reviewed: Allergy & Precautions, NPO status , Patient's Chart, lab work & pertinent test results  History of Anesthesia Complications Negative for: history of anesthetic complications  Airway Mallampati: III       Dental   Pulmonary neg pulmonary ROS,           Cardiovascular hypertension, Pt. on medications      Neuro/Psych negative neurological ROS     GI/Hepatic negative GI ROS, Neg liver ROS,   Endo/Other  negative endocrine ROSMorbid obesity  Renal/GU negative Renal ROS     Musculoskeletal   Abdominal   Peds  Hematology  (+) anemia ,   Anesthesia Other Findings   Reproductive/Obstetrics                            Anesthesia Physical Anesthesia Plan  ASA: III  Anesthesia Plan: Spinal   Post-op Pain Management:    Induction:   Airway Management Planned:   Additional Equipment:   Intra-op Plan:   Post-operative Plan:   Informed Consent: I have reviewed the patients History and Physical, chart, labs and discussed the procedure including the risks, benefits and alternatives for the proposed anesthesia with the patient or authorized representative who has indicated his/her understanding and acceptance.     Plan Discussed with:   Anesthesia Plan Comments:         Anesthesia Quick Evaluation

## 2016-02-27 NOTE — H&P (Signed)
Reviewed paper H+P, will be scanned into chart. No changes noted.  

## 2016-02-27 NOTE — OR Nursing (Signed)
Dr. Rudene Christians paged and notified of questionable nondisplaced medial tibial plateau fracture on post-op films.  States ," it is not unusual , no repeat film necessary."

## 2016-02-27 NOTE — Evaluation (Signed)
Physical Therapy Evaluation Patient Details Name: Kelly Wallace MRN: [id number] DOB: 11-10-1950 Today's Date: 02/27/2016   History of Present Illness  66 y/o female s/p L TKA 02/27/16.  She had R knee replaced ~2 years ago.   Clinical Impression  Pt did very well during POD0 PT exam.  She was able to do 10 SLRs, had good A/PROM (full extension and >70 flexion), ambulated ~30 ft and did not need direct assist for mobility and transfers.  Pt did have expected pain; ~5/10 at rest that did increase with activity, but overall showed great effort and exceeded typical post-op day 0 expectations.      Follow Up Recommendations Home health PT    Equipment Recommendations       Recommendations for Other Services       Precautions / Restrictions Precautions Precautions: Fall Required Braces or Orthoses:  (able to do 10 SLRs) Restrictions Weight Bearing Restrictions: Yes LLE Weight Bearing: Weight bearing as tolerated      Mobility  Bed Mobility Overal bed mobility: Independent             General bed mobility comments: Pt needed only minimal UE use of rails to get to sitting EOB  Transfers Overall transfer level: Independent Equipment used: Rolling walker (2 wheeled)             General transfer comment: Pt needed only minimal cuing for hand placement and sequencing.  Ambulation/Gait Ambulation/Gait assistance: Min guard Ambulation Distance (Feet): 30 Feet Assistive device: Rolling walker (2 wheeled)       General Gait Details: Pt was able to ambulate with slow, but consistent cadence and generally was able to take weight well though L LE and though she had some fatigue her O2 was 98% after the effort and she showed good safety.  Stairs            Wheelchair Mobility    Modified Rankin (Stroke Patients Only)       Balance Overall balance assessment: Modified Independent                                           Pertinent Vitals/Pain  Pain Assessment: 0-10 Pain Score: 5  Pain Location: Pt reports minimal L knee pain at rest, increased with activity    Home Living Family/patient expects to be discharged to:: Private residence Living Arrangements: Spouse/significant other;Children     Home Access: Stairs to enter Entrance Stairs-Rails: Can reach both Entrance Stairs-Number of Steps: 8   Home Equipment: Walker - 2 wheels      Prior Function Level of Independence: Independent         Comments: Pt works at [employer], 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  She was able to do SLRs w/o assist and generally showed functional strength       Communication   Communication: No difficulties  Cognition Arousal/Alertness: Awake/alert Behavior During Therapy: WFL for tasks assessed/performed Overall Cognitive Status: Within Functional Limits for tasks assessed  General Comments      Exercises Total Joint Exercises Ankle Circles/Pumps: AROM;10 reps Quad Sets: Strengthening;10 reps Gluteal Sets: Strengthening;10 reps Heel Slides: Strengthening;10 reps Hip ABduction/ADduction: Strengthening;10 reps Straight Leg Raises: AROM;10 reps Knee Flexion: PROM;10 reps Goniometric ROM: 0-70   Assessment/Plan    PT Assessment Patient needs continued PT services  PT Problem List Decreased strength;Decreased range of motion;Decreased activity tolerance;Decreased balance;Decreased mobility;Decreased coordination;Decreased knowledge of use of DME;Decreased safety awareness;Pain          PT Treatment Interventions DME instruction;Gait training;Stair training;Functional mobility training;Therapeutic activities;Therapeutic  exercise;Balance training;Neuromuscular re-education;Patient/family education    PT Goals (Current goals can be found in the Care Plan section)  Acute Rehab PT Goals Patient Stated Goal: go home PT Goal Formulation: With patient Time For Goal Achievement: 03/12/16 Potential to Achieve Goals: Good    Frequency BID   Barriers to discharge        Co-evaluation               End of Session Equipment Utilized During Treatment: Gait belt Activity Tolerance: Patient tolerated treatment well Patient left: with call bell/phone within reach;with chair alarm set;with family/visitor present Nurse Communication: Mobility status         Time: [id number] PT Time Calculation (min) (ACUTE ONLY): 28 min   Charges:   PT Evaluation $PT Eval Low Complexity: 1 Procedure PT Treatments $Therapeutic Exercise: 8-22 mins   PT G Codes:        Kreg Shropshire, DPT 02/27/2016, 5:41 PM

## 2016-02-27 NOTE — Op Note (Signed)
02/27/2016  12:07 PM  PATIENT:  Kelly Wallace  66 y.o. female  PRE-OPERATIVE DIAGNOSIS:  PRIMARY LOCALIZED OSTEOARTHRITIS OF LEFT KNEE  POST-OPERATIVE DIAGNOSIS:  PRIMARY LOCALIZED OSTEOARTHRITIS OF LEFT KNEE  PROCEDURE:  Procedure(s): TOTAL KNEE ARTHROPLASTY (Left)  SURGEON: Laurene Footman, MD  ASSISTANTS: Rachelle Hora [facility]  ANESTHESIA:   spinal  EBL:  Total I/O In: 1100 [I.V.:1100] Out: 250 [Urine:200; Blood:50]  BLOOD ADMINISTERED:none  DRAINS: none   LOCAL MEDICATIONS USED:  MARCAINE    and OTHER Exparel and morphine, Toradol  SPECIMEN:  No Specimen  DISPOSITION OF SPECIMEN:  N/A  COUNTS:  YES  TOURNIQUET:   77 minutes at 300 mmHg  IMPLANTS: Medacta GMK sphere 3 left 3 tibia with 105 stem with a 3 mm offset and 10 mm insert, 3 patella all components cemented  DICTATION: .Dragon Dictation patient brought the operating room and after adequate anesthesia was obtained the left leg was prepped and draped in sterile fashion. After patient identification and timeout procedures were completed, tourniquet was raised and a midline skin incision made. After medial parapatellar arthrotomy the knee was inspected there is eburnated bone in the patella femoral joint and medial compartment with exposed bone. Lateral compartment also had extensive wear. The anterior cruciate ligament appeared to be deficient. The fat pad was excised and the proximal tibia exposure was carried out for application the mid [place] cutting guide with proximal tibia cut carried out. The bone was removed and the distal femoral cut was made in a similar fashion. Anterior posterior and chamfer cuts then made off the distal femur and residual posterior horns of the menisci excised at this time. Which with reaming of the canal the size 3 tibial base plate appeared to fit well with the 3 mm offset as the patient varus deformity and with her body habitus psoas stemmed implant would be best and based on preop templating  this required on offset. Proximal tibial preparation was carried out and the tibial trial placed followed by the femoral trial and a 10 mm insert. Distal femoral drill holes were made and the slot cut made for the distal trocar femoral trochlea. The patella was cut using the patellar cutting guide and measured a size 3 after drill holes were made. Tourniquet was let down and synovectomy carried out followed by thorough irrigation of the joint and hemostasis. The injections were made at this time with the above medications. The tourniquet was then raised and the bony surfaces thoroughly irrigated and dried with the tibial components cemented into place first followed by the tibial insert and set screw femoral component and the knee held in extension as the cement set with the patellar button clamped into place with excess cement being removed. After the cemented set and all excess cement. Been removed that was visible the knee was thoroughly irrigated. The tip patella was noted to track well with no touch technique. The tourniquet was let down and there is no significant bleeding. The arthrotomy was repaired using a heavy Quill suture.3-0 v-loc subcutaneous closure followed by skin staples. Xeroform 4 x 4's ABDs and web roll Polar Care and Ace wrap applied  PLAN OF CARE: Admit to inpatient   PATIENT DISPOSITION:  PACU - hemodynamically stable.

## 2016-02-27 NOTE — Transfer of Care (Signed)
Immediate Anesthesia Transfer of Care Note  Patient: Kelly Wallace  Procedure(s) Performed: Procedure(s): TOTAL KNEE ARTHROPLASTY (Left)  Patient Location: PACU  Anesthesia Type:Spinal  Level of Consciousness: sedated  Airway & Oxygen Therapy: Patient Spontanous Breathing and Patient connected to face mask oxygen  Post-op Assessment: Report given to RN and Post -op Vital signs reviewed and stable  Post vital signs: Reviewed and stable  Last Vitals:  Vitals:   02/27/16 0846  BP: (!) 162/89  Pulse: 73  Resp: 16  Temp: 36.9 C    Last Pain:  Vitals:   02/27/16 0846  TempSrc: Tympanic  PainSc: 0-No pain      Patients Stated Pain Goal: 0 ([id number])  Complications: No apparent anesthesia complications

## 2016-02-28 LAB — BASIC METABOLIC PANEL
Anion gap: 5 (ref 5–15)
BUN: 12 mg/dL (ref 6–20)
CO2: 25 mmol/L (ref 22–32)
Calcium: 7.7 mg/dL — ABNORMAL LOW (ref 8.9–10.3)
Chloride: 109 mmol/L (ref 101–111)
Creatinine, Ser: 0.71 mg/dL (ref 0.44–1.00)
GFR calc Af Amer: >60 mL/min (ref >60)
GFR calc non Af Amer: >60 mL/min (ref >60)
Glucose, Bld: 119 mg/dL — ABNORMAL HIGH (ref 65–99)
Potassium: 3.9 mmol/L (ref 3.5–5.1)
Sodium: 139 mmol/L (ref 135–145)

## 2016-02-28 LAB — CBC
HCT: 28.5 % — ABNORMAL LOW (ref 35.0–47.0)
Hemoglobin: 9.2 g/dL — ABNORMAL LOW (ref 12.0–16.0)
MCH: 27 pg (ref 26.0–34.0)
MCHC: 32.4 g/dL (ref 32.0–36.0)
MCV: 83.3 fL (ref 80.0–100.0)
Platelets: 186 10*3/uL (ref 150–440)
RBC: 3.42 MIL/uL — ABNORMAL LOW (ref 3.80–5.20)
RDW: 16.2 % — ABNORMAL HIGH (ref 11.5–14.5)
WBC: 7 10*3/uL (ref 3.6–11.0)

## 2016-02-28 MED ORDER — FE FUMARATE-B12-VIT C-FA-IFC PO CAPS
1.0000 | ORAL_CAPSULE | Freq: Two times a day (BID) | ORAL | Status: DC
  Administered 2016-02-28 – 2016-03-01 (×5): 1 via ORAL
  Filled 2016-02-28 (×4): qty 1

## 2016-02-28 NOTE — Progress Notes (Signed)
Physical Therapy Treatment Patient Details Name: Kelly Wallace MRN: [id number] DOB: 03/23/1950 Today's Date: 02/28/2016    History of Present Illness 66 y/o female s/p L TKA 02/27/16.  She had R knee replaced ~2 years ago. PMHx significant for anemia, arthritis, colon polyps, HTN.    PT Comments    Pt is making good progress towards goals with increased ambulation distance this session. Pt demonstrates upright posture and able to follow cues for reciprocal gait. Pt limited in distance secondary to pain, premedicated prior to session. Good endurance with there-ex, written packet given and explained. Will continue to progress.  Follow Up Recommendations  Home health PT     Equipment Recommendations       Recommendations for Other Services       Precautions / Restrictions Precautions Precautions: Fall Restrictions Weight Bearing Restrictions: Yes LLE Weight Bearing: Weight bearing as tolerated    Mobility  Bed Mobility Overal bed mobility: Needs Assistance Bed Mobility: Supine to Sit     Supine to sit: Min guard     General bed mobility comments: safe technique performed with sliding B LE off bed. Once seated, pt able to sit with upright posture.  Transfers Overall transfer level: Needs assistance Equipment used: Rolling walker (2 wheeled) Transfers: Sit to/from Stand Sit to Stand: Min guard         General transfer comment: safe technique with cues for pushing from seated surface. Slight forward flexed posture once standing, however able to correct  Ambulation/Gait Ambulation/Gait assistance: Min guard Ambulation Distance (Feet): 120 Feet Assistive device: Rolling walker (2 wheeled) Gait Pattern/deviations: Step-through pattern     General Gait Details: ambulated using reciprocal gait pattern and slight antalgic gait. Improved step symmetry with cues and pt able to demonstrate upright head posture. Slow speed noted   Stairs            Wheelchair  Mobility    Modified Rankin (Stroke Patients Only)       Balance                                    Cognition Arousal/Alertness: Awake/alert Behavior During Therapy: WFL for tasks assessed/performed Overall Cognitive Status: Within Functional Limits for tasks assessed                      Exercises Other Exercises Other Exercises: supine ther-ex performed on L LE including ankle pumps, quad sets, SLRs, SAQ, and hip abd/add. All ther-ex performed x 12 reps and written HEP given and explained to patient.    General Comments        Pertinent Vitals/Pain Pain Assessment: 0-10 Pain Score: 3  Pain Location: L knee Pain Descriptors / Indicators: Operative site guarding Pain Intervention(s): Limited activity within patient's tolerance    Home Living                      Prior Function            PT Goals (current goals can now be found in the care plan section) Acute Rehab PT Goals Patient Stated Goal: go home PT Goal Formulation: With patient Time For Goal Achievement: 03/12/16 Potential to Achieve Goals: Good Progress towards PT goals: Progressing toward goals    Frequency    BID      PT Plan Current plan remains appropriate    Co-evaluation  End of Session Equipment Utilized During Treatment: Gait belt Activity Tolerance: Patient tolerated treatment well Patient left: in bed;with bed alarm set;with SCD's reapplied     Time: 1320-1343 PT Time Calculation (min) (ACUTE ONLY): 23 min  Charges:  $Gait Training: 8-22 mins $Therapeutic Exercise: 8-22 mins                    G Codes:      Leasa Kincannon 03/03/2016, 2:53 PM  Greggory Stallion, PT, DPT [phone]

## 2016-02-28 NOTE — Progress Notes (Signed)
Pts. Foley removed at 0530

## 2016-02-28 NOTE — Progress Notes (Signed)
Physical Therapy Treatment Patient Details Name: Kelly Wallace MRN: [id number] DOB: [birth year] Today's Date: 02/28/2016    History of Present Illness 66 y/o female s/p L TKA 02/27/16.  She had R knee replaced ~2 years ago. PMHx significant for anemia, arthritis, colon polyps, HTN.    PT Comments    Pt is making good progress towards goals with increased ambulation distance noted this date. Pt still demonstrates antalgic gait pattern, however very motivated to perform therapy. Good endurance with there-ex as well as ROM. Pt still limited by pain. Will continue to progress.  Follow Up Recommendations  Home health PT     Equipment Recommendations       Recommendations for Other Services       Precautions / Restrictions Precautions Precautions: Fall Restrictions Weight Bearing Restrictions: Yes LLE Weight Bearing: Weight bearing as tolerated    Mobility  Bed Mobility Overal bed mobility: Modified Independent             General bed mobility comments: pt received seated at EOB, safe technique performed  Transfers Overall transfer level: Needs assistance Equipment used: Rolling walker (2 wheeled) Transfers: Sit to/from Stand Sit to Stand: Min guard         General transfer comment: safe technique with cues for pushing from seated surface. Once standing, pt able to stand with upright posture.  Ambulation/Gait Ambulation/Gait assistance: Min guard Ambulation Distance (Feet): 60 Feet Assistive device: Rolling walker (2 wheeled) Gait Pattern/deviations: Step-through pattern     General Gait Details: Pt ambulated using step to gait pattern with antalgic pattern noted. Pt fatigues requesting to return back to room. Slow speed noted   Stairs            Wheelchair Mobility    Modified Rankin (Stroke Patients Only)       Balance Overall balance assessment: Modified Independent                                  Cognition Arousal/Alertness:  Awake/alert Behavior During Therapy: WFL for tasks assessed/performed Overall Cognitive Status: Within Functional Limits for tasks assessed                      Exercises Total Joint Exercises Goniometric ROM: L knee AAROM: 1-85 degrees Other Exercises Other Exercises: supine ther-ex performed on L LE including ankle pumps, quad sets, SLRs, hip abd/add, LAQ, and seated knee flexion stretches. All ther-ex performed x 10 reps with cga and cues for sequencing.    General Comments        Pertinent Vitals/Pain Pain Assessment: 0-10 Pain Score: 7  Pain Location: L knee Pain Descriptors / Indicators: Operative site guarding;Dull Pain Intervention(s): Limited activity within patient's tolerance;Premedicated before session;Ice applied    Home Living Family/patient expects to be discharged to:: Private residence Living Arrangements: Spouse/significant other;Children (5 children) Available Help at Discharge: Family;Available 24 hours/day Type of Home: House Home Access: Stairs to enter Entrance Stairs-Rails: Can reach both;Right;Left Home Layout: One level Home Equipment: Shower seat;Bedside commode;Cane - single point;Walker - 2 wheels;Hand held shower head      Prior Function Level of Independence: Independent      Comments: Pt independent in all ADL, IADL; works at [employer] and other job, able to be active, drives   PT Goals (current goals can now be found in the care plan section) Acute Rehab PT Goals Patient Stated Goal: go home PT Goal  Formulation: With patient Time For Goal Achievement: 03/12/16 Potential to Achieve Goals: Good Progress towards PT goals: Progressing toward goals    Frequency    BID      PT Plan Current plan remains appropriate    Co-evaluation             End of Session Equipment Utilized During Treatment: Gait belt Activity Tolerance: Patient tolerated treatment well Patient left: in chair;with chair alarm set;with call bell/phone  within reach;with SCD's reapplied     Time: [id number] PT Time Calculation (min) (ACUTE ONLY): 23 min  Charges:  $Gait Training: 8-22 mins $Therapeutic Exercise: 8-22 mins                    G Codes:      Tevon Berhane 03/21/2016, 10:27 AM  Greggory Stallion, PT, DPT [phone]

## 2016-02-28 NOTE — Evaluation (Signed)
Occupational Therapy Evaluation Patient Details Name: Kelly Wallace MRN: [id number] DOB: 1950-02-22 Today's Date: 02/28/2016    History of Present Illness 66 y/o female s/p L TKA 02/27/16.  She had R knee replaced ~2 years ago. PMHx significant for anemia, arthritis, colon polyps, HTN.   Clinical Impression   Pt seen for OT evaluation this date. Pt independent with ADL, IADL at baseline and pt very active, works 2 jobs. Pt presents with pain, decreased activity tolerance/knowledge of AE for self care tasks and could benefit from skilled OT services to address noted impairments and functional deficits identified below and including energy conservation strategies to maximize return to PLOF and minimize falls risk.     Follow Up Recommendations  Home health OT    Equipment Recommendations  None recommended by OT    Recommendations for Other Services       Precautions / Restrictions Precautions Precautions: Fall Restrictions Weight Bearing Restrictions: Yes LLE Weight Bearing: Weight bearing as tolerated      Mobility Bed Mobility Overal bed mobility: Modified Independent             General bed mobility comments: Pt required minimal UE use of rails and extra time to complete supine>sit EOB  Transfers Overall transfer level: Modified independent Equipment used: Rolling walker (2 wheeled)             General transfer comment: close supervision initially decreasing to modified independent for additional time to complete, minimal verbal cues for hand placement, no LOB noted    Balance Overall balance assessment: Modified Independent                                          ADL Overall ADL's : Needs assistance/impaired Eating/Feeding: Sitting;Set up   Grooming: Wash/dry hands;Standing;Min guard Grooming Details (indicate cue type and reason): Pt washed hands at sink while standing with min guard, no LOB noted Upper Body Bathing: Set up;Sitting   Lower Body Bathing: Minimal assistance;Sitting/lateral leans;Sit to/from stand   Upper Body Dressing : Set up;Sitting   Lower Body Dressing: Minimal assistance;Sit to/from stand;Sitting/lateral leans   Toilet Transfer: Designer, industrial/product;Ambulation;RW;Min guard Armed forces technical officer Details (indicate cue type and reason): Pt performed toilet transfer to [facility] over top toilet demonstrating safe technique and no LOB noted Toileting- Clothing Manipulation and Hygiene: Min guard;Sit to/from stand       Functional mobility during ADLs: Min guard General ADL Comments: Pt generally minimal assist for LB ADL tasks, min guard for functional mobility during ADL with minimal verbal cues for hand placement      Vision Vision Assessment?: No apparent visual deficits   Perception     Praxis Praxis Praxis tested?: Within functional limits    Pertinent Vitals/Pain Pain Assessment: 0-10 Pain Score: 5  Pain Location: L knee, received pain meds approx 30 minutes prior to OT evaluation Pain Intervention(s): Limited activity within patient's tolerance;Monitored during session;Premedicated before session     Hand Dominance     Extremity/Trunk Assessment Upper Extremity Assessment Upper Extremity Assessment: Overall WFL for tasks assessed   Lower Extremity Assessment Lower Extremity Assessment: Defer to PT evaluation;LLE deficits/detail   Cervical / Trunk Assessment Cervical / Trunk Assessment: Normal   Communication Communication Communication: No difficulties   Cognition Arousal/Alertness: Awake/alert Behavior During Therapy: WFL for tasks assessed/performed Overall Cognitive Status: Within Functional Limits for tasks assessed  General Comments       Exercises       Shoulder Instructions      Home Living Family/patient expects to be discharged to:: Private residence Living Arrangements: Spouse/significant other;Children (5 children) Available Help at  Discharge: Family;Available 24 hours/day Type of Home: House Home Access: Stairs to enter [employer] of Steps: 8 Entrance Stairs-Rails: Can reach both;Right;Left Home Layout: One level     Bathroom Shower/Tub: Tub/shower unit Shower/tub characteristics: Door Biochemist, clinical: Handicapped height (BSC over toilet) Bathroom Accessibility: Yes How Accessible: Accessible via wheelchair;Accessible via walker Home Equipment: Shower seat;Bedside commode;Cane - single point;Walker - 2 wheels;Hand held shower head          Prior Functioning/Environment Level of Independence: Independent        Comments: Pt independent in all ADL, IADL; works at [employer] and other job, able to be active, drives        OT Problem List: Pain;Decreased activity tolerance;Decreased knowledge of use of DME or AE   OT Treatment/Interventions: Self-care/ADL training;DME and/or AE instruction;Patient/family education;Energy conservation    OT Goals(Current goals can be found in the care plan section) Acute Rehab OT Goals Patient Stated Goal: go home OT Goal Formulation: With patient Time For Goal Achievement: 03/13/16 Potential to Achieve Goals: Good  OT Frequency: Min 1X/week   Barriers to D/C:            Co-evaluation              End of Session Equipment Utilized During Treatment: Gait belt;Rolling walker  Activity Tolerance: Patient tolerated treatment well Patient left: in bed;with call bell/phone within reach;Other (comment) (Pt left seated EOB with PT)   Time: [id number] OT Time Calculation (min): 27 min Charges:  OT General Charges $OT Visit: 1 Procedure OT Evaluation $OT Eval Low Complexity: 1 Procedure OT Treatments $Self Care/Home Management : 8-22 mins G-Codes:    Corky Sox, OTR/L 02/28/2016, 10:00 AM

## 2016-02-28 NOTE — Care Management Note (Signed)
Case Management Note  Patient Details  Name: Kelly Wallace MRN: [id number] Date of Birth: [birth year]  Subjective/Objective:  POD  # 1 left TKA. Met with  patient at bedside to discuss discharge planning. She lives at home with her spouse and daughter who will be her caregivers at discharge.  Offered choice of home health agencies. Referral called to [facility]. PCP is [facility]. Pharmacy - [facility] [phone]. Called Lovenox 40 mg, no refills. Patient has a walker at home. No DME needs                  Action/Plan:   Expected Discharge Date:                  Expected Discharge Plan:  [place]  In-House Referral:     Discharge planning Services  CM Consult  Post Acute Care Choice:  Home Health Choice offered to:  Patient  DME Arranged:    DME Agency:     HH Arranged:  PT [place]:  [facility] (now [facility])  Status of Service:  In process, will continue to follow  If discussed at Long Length of Stay Meetings, dates discussed:    Additional Comments:  Jolly Mango, RN 02/28/2016, 9:37 AM

## 2016-02-28 NOTE — Progress Notes (Signed)
Clinical Social Worker (CSW) received SNF consult. PT is recommending home health. RN case manager aware of above. Please reconsult if future social work needs arise. CSW signing off.   Breelle Hollywood, LCSW [phone] 

## 2016-02-28 NOTE — Anesthesia Postprocedure Evaluation (Signed)
Anesthesia Post Note  Patient: Kelly Wallace  Procedure(s) Performed: Procedure(s) (LRB): TOTAL KNEE ARTHROPLASTY (Left)  Patient location during evaluation: Other Anesthesia Type: Spinal Level of consciousness: oriented and awake and alert Pain management: pain level controlled Vital Signs Assessment: post-procedure vital signs reviewed and stable Respiratory status: spontaneous breathing, respiratory function stable and patient connected to nasal cannula oxygen Cardiovascular status: blood pressure returned to baseline and stable Postop Assessment: no headache and no backache Anesthetic complications: no     Last Vitals:  Vitals:   02/28/16 0331 02/28/16 0721  BP: 134/76 (!) 155/73  Pulse: 79 76  Resp: 18 16  Temp: 36.8 C 37.1 C    Last Pain:  Vitals:   02/28/16 0721  TempSrc: Oral  PainSc:                  Alison Stalling

## 2016-02-28 NOTE — Progress Notes (Signed)
   Subjective: 1 Day Post-Op Procedure(s) (LRB): TOTAL KNEE ARTHROPLASTY (Left) Patient reports pain as moderate.   Patient is well, and has had no acute complaints or problems Denies any CP, SOB, ABD pain. We will continue therapy today.  Plan is to go Home after hospital stay.  Objective: Vital signs in last 24 hours: Temp:  [97 F (36.1 C)-98.8 F (37.1 C)] 98.8 F (37.1 C) (02/07 0721) Pulse Rate:  [67-79] 76 (02/07 0721) Resp:  [13-24] 16 (02/07 0721) BP: (112-162)/(61-89) 155/73 (02/07 0721) SpO2:  [96 %-100 %] 99 % (02/07 0721) Weight:  [105.7 kg (233 lb)] 105.7 kg (233 lb) (02/06 0846)  Intake/Output from previous day: 02/06 0701 - 02/07 0700 In: 2437.5 [I.V.:2437.5] Out: 775 [Urine:725; Blood:50] Intake/Output this shift: No intake/output data recorded.   Recent Labs  02/27/16 1331 02/28/16 0410  HGB 10.5* 9.2*    Recent Labs  02/27/16 1331 02/28/16 0410  WBC 5.8 7.0  RBC 3.87 3.42*  HCT 32.1* 28.5*  PLT 207 186    Recent Labs  02/27/16 1331 02/28/16 0410  NA  --  139  K  --  3.9  CL  --  109  CO2  --  25  BUN  --  12  CREATININE 0.61 0.71  GLUCOSE  --  119*  CALCIUM  --  7.7*   No results for input(s): LABPT, INR in the last 72 hours.  EXAM General - Patient is Alert, Appropriate and Oriented Extremity - Neurovascular intact Sensation intact distally Intact pulses distally Dorsiflexion/Plantar flexion intact No cellulitis present Compartment soft  Bone foam intat Dressing - dressing C/D/I and no drainage Motor Function - intact, moving foot and toes well on exam.   Past Medical History:  Diagnosis Date  . Anemia    not currently being treated for this  . Arthritis   . History of colon polyps   . Hypertension     Assessment/Plan:   1 Day Post-Op Procedure(s) (LRB): TOTAL KNEE ARTHROPLASTY (Left) Active Problems:   Primary localized osteoarthritis of left knee   Acute post op blood loss anemia   Estimated body mass  index is 42.62 kg/m as calculated from the following:   Height as of this encounter: 5\' 2"  (1.575 m).   Weight as of this encounter: 105.7 kg (233 lb). Advance diet Up with therapy  Needs BM Recheck Hgb in the am CM to assist with discharge  DVT Prophylaxis - Lovenox, Foot Pumps and TED hose Weight-Bearing as tolerated to left leg   T. Rachelle Hora, PA-C [place] 02/28/2016, 7:33 AM

## 2016-02-29 LAB — BASIC METABOLIC PANEL
Anion gap: 6 (ref 5–15)
BUN: 7 mg/dL (ref 6–20)
CO2: 24 mmol/L (ref 22–32)
Calcium: 8 mg/dL — ABNORMAL LOW (ref 8.9–10.3)
Chloride: 106 mmol/L (ref 101–111)
Creatinine, Ser: 0.66 mg/dL (ref 0.44–1.00)
GFR calc Af Amer: >60 mL/min (ref >60)
GFR calc non Af Amer: >60 mL/min (ref >60)
Glucose, Bld: 133 mg/dL — ABNORMAL HIGH (ref 65–99)
Potassium: 3.4 mmol/L — ABNORMAL LOW (ref 3.5–5.1)
Sodium: 136 mmol/L (ref 135–145)

## 2016-02-29 LAB — CBC
HCT: 27.9 % — ABNORMAL LOW (ref 35.0–47.0)
Hemoglobin: 9.3 g/dL — ABNORMAL LOW (ref 12.0–16.0)
MCH: 27.4 pg (ref 26.0–34.0)
MCHC: 33.2 g/dL (ref 32.0–36.0)
MCV: 82.5 fL (ref 80.0–100.0)
Platelets: 179 10*3/uL (ref 150–440)
RBC: 3.38 MIL/uL — ABNORMAL LOW (ref 3.80–5.20)
RDW: 16.1 % — ABNORMAL HIGH (ref 11.5–14.5)
WBC: 9.3 10*3/uL (ref 3.6–11.0)

## 2016-02-29 MED ORDER — POTASSIUM CHLORIDE 20 MEQ PO PACK
20.0000 meq | PACK | Freq: Two times a day (BID) | ORAL | Status: AC
  Administered 2016-02-29 – 2016-03-01 (×3): 20 meq via ORAL
  Filled 2016-02-29 (×3): qty 1

## 2016-02-29 NOTE — Progress Notes (Signed)
Physical Therapy Treatment Patient Details Name: Kelly Wallace MRN: [id number] DOB: [birth year] Today's Date: 02/29/2016    History of Present Illness 66 y/o female s/p L TKA 02/27/16.  She had R knee replaced ~2 years ago. PMHx significant for anemia, arthritis, colon polyps, HTN.    PT Comments    Pt agreed on third attempt this am after receiving pain meds.  Participated in exercises as described below.  To edge of bed with min assist for  [facility] management.  She stood and was able to ambulate to bathroom with slow step to gait.  Voided without difficulty.  She then stood and ambulated 10' to doorway when she c/o dizziness and needing to sit.  BP 159/78.   She requested to lay back down and was assisted with min a x 1 to transfer and mod a for LE management.  Discussed with primary nurse.    Pt with poor activity tolerance this am.  Will continue in pm.  Pt has 8 stairs into her home with B rails.   Follow Up Recommendations  Home health PT     Equipment Recommendations       Recommendations for Other Services       Precautions / Restrictions Precautions Precautions: Fall Restrictions Weight Bearing Restrictions: Yes LLE Weight Bearing: Weight bearing as tolerated    Mobility  Bed Mobility Min assist for LLE management supine to sit Mod assist for LLE mangaement sit to supine                  Transfers Overall transfer level: Needs assistance Equipment used: Rolling walker (2 wheeled) Transfers: Sit to/from Stand Sit to Stand: Min guard            Ambulation/Gait   Ambulation Distance (Feet): 20 Feet Assistive device: Rolling walker (2 wheeled) Gait Pattern/deviations: Step-through pattern   Gait velocity interpretation: <1.8 ft/sec, indicative of risk for recurrent falls General Gait Details: 20' to bathroom, 10' towards door before c/o dizziness.  Very slow gait.   Stairs            Wheelchair Mobility    Modified Rankin (Stroke Patients  Only)       Balance Overall balance assessment: Needs assistance Sitting-balance support: Feet supported Sitting balance-Leahy Scale: Good     Standing balance support: Bilateral upper extremity supported Standing balance-Leahy Scale: Good                      Cognition Arousal/Alertness: Awake/alert Behavior During Therapy: WFL for tasks assessed/performed Overall Cognitive Status: Within Functional Limits for tasks assessed                      Exercises Total Joint Exercises Ankle Circles/Pumps: AROM;10 reps;Left;Supine Quad Sets: Strengthening;10 reps;Left;Supine Gluteal Sets: Strengthening;10 reps;Left;Supine Short Arc Quad: AROM;Strengthening;10 reps;Left;Supine Heel Slides: Strengthening;10 reps;Left;AROM;Supine Hip ABduction/ADduction: Supine Straight Leg Raises: AROM;10 reps;Left;Supine Knee Flexion: AAROM;Left;10 reps;Seated Goniometric ROM: 2-78    General Comments        Pertinent Vitals/Pain Pain Assessment: 0-10 Pain Score: 4  Pain Location: L knee Pain Descriptors / Indicators: Operative site guarding Pain Intervention(s): Premedicated before session    Home Living                      Prior Function            PT Goals (current goals can now be found in the care plan section) Progress towards PT  goals: Progressing toward goals    Frequency    BID      PT Plan Current plan remains appropriate    Co-evaluation             End of Session Equipment Utilized During Treatment: Gait belt Activity Tolerance: Patient tolerated treatment well Patient left: in bed;with bed alarm set;with SCD's reapplied     Time: 1000-1027 PT Time Calculation (min) (ACUTE ONLY): 27 min  Charges:  $Gait Training: 8-22 mins $Therapeutic Exercise: 8-22 mins                    G Codes:      Chesley Noon 03-05-16, 10:33 AM

## 2016-02-29 NOTE — Progress Notes (Signed)
   Subjective: 2 Days Post-Op Procedure(s) (LRB): TOTAL KNEE ARTHROPLASTY (Left) Patient reports pain as 7 on 0-10 scale.   Patient is well, and has had no acute complaints or problems Denies any CP, SOB, ABD pain. We will continue therapy today.  Plan is to go Home after hospital stay.  Objective: Vital signs in last 24 hours: Temp:  [98.3 F (36.8 C)-99.1 F (37.3 C)] 98.4 F (36.9 C) (02/08 0710) Pulse Rate:  [70-84] 79 (02/08 0710) Resp:  [16-19] 16 (02/08 0710) BP: (104-166)/(68-83) 166/82 (02/08 0710) SpO2:  [97 %-99 %] 99 % (02/08 0710)  Intake/Output from previous day: 02/07 0701 - 02/08 0700 In: 840 [P.O.:840] Out: -  Intake/Output this shift: No intake/output data recorded.   Recent Labs  02/27/16 1331 02/28/16 0410 02/29/16 0428  HGB 10.5* 9.2* 9.3*    Recent Labs  02/28/16 0410 02/29/16 0428  WBC 7.0 9.3  RBC 3.42* 3.38*  HCT 28.5* 27.9*  PLT 186 179    Recent Labs  02/28/16 0410 02/29/16 0428  NA 139 136  K 3.9 3.4*  CL 109 106  CO2 25 24  BUN 12 7  CREATININE 0.71 0.66  GLUCOSE 119* 133*  CALCIUM 7.7* 8.0*   No results for input(s): LABPT, INR in the last 72 hours.  EXAM General - Patient is Alert, Appropriate and Oriented Extremity - Neurovascular intact Sensation intact distally Intact pulses distally Dorsiflexion/Plantar flexion intact No cellulitis present Compartment soft  Bone foam intat Dressing - dressing C/D/I, moderate drainage and dressing changed, incision dry, no active drainage. Motor Function - intact, moving foot and toes well on exam.   Past Medical History:  Diagnosis Date  . Anemia    not currently being treated for this  . Arthritis   . History of colon polyps   . Hypertension     Assessment/Plan:   2 Days Post-Op Procedure(s) (LRB): TOTAL KNEE ARTHROPLASTY (Left) Active Problems:   Primary localized osteoarthritis of left knee   Acute post op blood loss anemia   Estimated body mass index is  42.62 kg/m as calculated from the following:   Height as of this encounter: 5\' 2"  (1.575 m).   Weight as of this encounter: 105.7 kg (233 lb). Advance diet Up with therapy  Needs BM Hgb/Hct stable K low, will order klor kon Plan on discharge to home tomorrow with HHPT  DVT Prophylaxis - Lovenox, Foot Pumps and TED hose Weight-Bearing as tolerated to left leg   T. Rachelle Hora, PA-C [place] 02/29/2016, 8:32 AM

## 2016-02-29 NOTE — Care Management (Signed)
Cost of Lovenox is $ 41.26. Patient updated.

## 2016-02-29 NOTE — Progress Notes (Signed)
Physical Therapy Treatment Patient Details Name: Kelly Wallace MRN: [id number] DOB: [birth year] Today's Date: 02/29/2016    History of Present Illness 66 y/o female s/p L TKA 02/27/16.  She had R knee replaced ~2 years ago. PMHx significant for anemia, arthritis, colon polyps, HTN.    PT Comments    Pt feeling better this pm, ready for session.  Participated in exercises as described below.  To edge of bed with min assist for LE management.  She was able to stand and ambulate to rehab gym 100' with walker and step to gait pattern.  Slow but steady.  After long seated rest she was able to go up/down 4 steps with bilateral rails with min guard x 2 and verbal cues.  Upon return to room via wheelchair she was encouraged to remain out of bed in recliner and she agreed.  Transferred with min guard.  Pt tolerated treatment better this pm.  She does have 8 stairs to enter her home and will need to review in am before discharge.  Gait limited by general fatigue.   Follow Up Recommendations  Home health PT     Equipment Recommendations       Recommendations for Other Services       Precautions / Restrictions Precautions Precautions: Fall Restrictions Weight Bearing Restrictions: Yes LLE Weight Bearing: Weight bearing as tolerated    Mobility  Bed Mobility Overal bed mobility: Needs Assistance Bed Mobility: Supine to Sit     Supine to sit: Min assist     General bed mobility comments: for LLE  Transfers Overall transfer level: Needs assistance Equipment used: Rolling walker (2 wheeled) Transfers: Sit to/from Stand Sit to Stand: Min guard            Ambulation/Gait Ambulation/Gait assistance: Min guard Ambulation Distance (Feet): 120 Feet Assistive device: Rolling walker (2 wheeled) Gait Pattern/deviations: Step-to pattern   Gait velocity interpretation: Below normal speed for age/gender General Gait Details: 20' to bathroom, 10' towards door before c/o dizziness.  Very  slow gait.   Stairs            Wheelchair Mobility    Modified Rankin (Stroke Patients Only)       Balance Overall balance assessment: Needs assistance Sitting-balance support: Feet supported Sitting balance-Leahy Scale: Good     Standing balance support: Bilateral upper extremity supported Standing balance-Leahy Scale: Good                      Cognition Arousal/Alertness: Awake/alert Behavior During Therapy: WFL for tasks assessed/performed Overall Cognitive Status: Within Functional Limits for tasks assessed                      Exercises Total Joint Exercises Ankle Circles/Pumps: AROM;10 reps;Left;Supine Quad Sets: Strengthening;10 reps;Left;Supine Gluteal Sets: Strengthening;10 reps;Left;Supine Short Arc Quad: AROM;Strengthening;10 reps;Left;Supine Heel Slides: Strengthening;10 reps;Left;AROM;Supine Hip ABduction/ADduction: Supine Straight Leg Raises: AROM;10 reps;Left;Supine Knee Flexion: AAROM;Left;10 reps;Seated Goniometric ROM: 2-78    General Comments        Pertinent Vitals/Pain Pain Assessment: 0-10 Pain Score: 5  Pain Location: L knee Pain Descriptors / Indicators: Operative site guarding Pain Intervention(s): Ice applied;Limited activity within patient's tolerance    Home Living                      Prior Function            PT Goals (current goals can now be found in the care  plan section) Progress towards PT goals: Progressing toward goals    Frequency    BID      PT Plan Current plan remains appropriate    Co-evaluation             End of Session Equipment Utilized During Treatment: Gait belt Activity Tolerance: Patient tolerated treatment well Patient left: in chair;with call bell/phone within reach;with family/visitor present;with chair alarm set     Time: [id number] PT Time Calculation (min) (ACUTE ONLY): 29 min  Charges:  $Gait Training: 8-22 mins $Therapeutic Exercise: 8-22  mins                    G Codes:      Chesley Noon 03-03-2016, 2:22 PM

## 2016-03-01 ENCOUNTER — Other Ambulatory Visit: Payer: Self-pay | Admitting: Orthopedic Surgery

## 2016-03-01 DIAGNOSIS — Z96652 Presence of left artificial knee joint: Secondary | ICD-10-CM

## 2016-03-01 MED ORDER — ENOXAPARIN SODIUM 40 MG/0.4ML ~~LOC~~ SOLN: 40.0000 mg | SUBCUTANEOUS | 0 refills | Status: DC

## 2016-03-01 MED ORDER — OXYCODONE HCL 5 MG PO TABS: 5.0000 mg | ORAL_TABLET | ORAL | 0 refills | Status: DC | PRN

## 2016-03-01 NOTE — Progress Notes (Signed)
   Subjective: 3 Days Post-Op Procedure(s) (LRB): TOTAL KNEE ARTHROPLASTY (Left) Patient reports pain as mild.   Patient is well, and has had no acute complaints or problems Denies any CP, SOB, ABD pain. We will continue therapy today.  Plan is to go Home after hospital stay.  Objective: Vital signs in last 24 hours: Temp:  [98.2 F (36.8 C)-98.7 F (37.1 C)] 98.5 F (36.9 C) (02/09 0339) Pulse Rate:  [86-96] 96 (02/09 0339) Resp:  [16] 16 (02/09 0339) BP: (144-163)/(73-89) 144/73 (02/09 0339) SpO2:  [96 %-100 %] 96 % (02/09 0339)  Intake/Output from previous day: 02/08 0701 - 02/09 0700 In: 720 [P.O.:720] Out: -  Intake/Output this shift: No intake/output data recorded.   Recent Labs  02/27/16 1331 02/28/16 0410 02/29/16 0428  HGB 10.5* 9.2* 9.3*    Recent Labs  02/28/16 0410 02/29/16 0428  WBC 7.0 9.3  RBC 3.42* 3.38*  HCT 28.5* 27.9*  PLT 186 179    Recent Labs  02/28/16 0410 02/29/16 0428  NA 139 136  K 3.9 3.4*  CL 109 106  CO2 25 24  BUN 12 7  CREATININE 0.71 0.66  GLUCOSE 119* 133*  CALCIUM 7.7* 8.0*   No results for input(s): LABPT, INR in the last 72 hours.  EXAM General - Patient is Alert, Appropriate and Oriented Extremity - Neurovascular intact Sensation intact distally Intact pulses distally Dorsiflexion/Plantar flexion intact No cellulitis present Compartment soft  Dressing - dressing C/D/I and scant drainage Motor Function - intact, moving foot and toes well on exam.   Past Medical History:  Diagnosis Date  . Anemia    not currently being treated for this  . Arthritis   . History of colon polyps   . Hypertension     Assessment/Plan:   3 Days Post-Op Procedure(s) (LRB): TOTAL KNEE ARTHROPLASTY (Left) Active Problems:   Primary localized osteoarthritis of left knee   Acute post op blood loss anemia   Estimated body mass index is 42.62 kg/m as calculated from the following:   Height as of this encounter: 5\' 2"   (1.575 m).   Weight as of this encounter: 105.7 kg (233 lb). Advance diet Up with therapy  Plan on discharge to home today with HHPT pending progress with PT and bowel movement. Follow up with [place] ortho in 2 weeks  DVT Prophylaxis - Lovenox, Foot Pumps and TED hose Weight-Bearing as tolerated to left leg   T. Rachelle Hora, PA-C [place] 03/01/2016, 7:36 AM

## 2016-03-01 NOTE — Progress Notes (Signed)
Physical Therapy Treatment Patient Details Name: Kelly Wallace MRN: [id number] DOB: Jun 16, 1950 Today's Date: 03/01/2016    History of Present Illness 66 y/o female s/p L TKA 02/27/16.  She had R knee replaced ~2 years ago. PMHx significant for anemia, arthritis, colon polyps, HTN.    PT Comments    Pt is making good progress towards goals and is ready for home dc. Pt reports she feels competent with stair training and prefers not to practice this date despite encouragement. Per notes, pt performed stair training previous date. Therapist reviewed stair performance and technique. Reviewed HEP and pt demonstrates good performance. Pt able to ambulate around RN station this date with improved reciprocal and fluid gait pattern. Good progress with ROM.  Follow Up Recommendations  Home health PT     Equipment Recommendations       Recommendations for Other Services       Precautions / Restrictions Precautions Precautions: Fall;Knee Precaution Booklet Issued: Yes (comment) Restrictions Weight Bearing Restrictions: Yes LLE Weight Bearing: Weight bearing as tolerated    Mobility  Bed Mobility Overal bed mobility: Needs Assistance Bed Mobility: Supine to Sit     Supine to sit: Min guard     General bed mobility comments: slight assist for L LE, however able to respond well to cues  Transfers Overall transfer level: Needs assistance Equipment used: Rolling walker (2 wheeled) Transfers: Sit to/from Stand Sit to Stand: Min guard         General transfer comment: safe technique performed  Ambulation/Gait Ambulation/Gait assistance: Min guard Ambulation Distance (Feet): 200 Feet Assistive device: Rolling walker (2 wheeled) Gait Pattern/deviations: Step-through pattern     General Gait Details: initial step to gait pattern, however able to improve to reciprocal gait pattern.. Cues for holding head up instead of looking down. Slow gait speed, however no seated rest breaks noted.  no reports of dizziness this session.   Stairs            Wheelchair Mobility    Modified Rankin (Stroke Patients Only)       Balance                                    Cognition Arousal/Alertness: Awake/alert Behavior During Therapy: WFL for tasks assessed/performed Overall Cognitive Status: Within Functional Limits for tasks assessed                      Exercises Total Joint Exercises Goniometric ROM: L knee AAROM: 0-90 degrees Other Exercises Other Exercises: supine ther-ex performed on L LE including ankle pumps, quad sets, SLRs, seated knee flexion stretches, and hip abd/add. All ther-ex performed x 15 reps and written HEP given and explained to patient. CGA given for assistance    General Comments        Pertinent Vitals/Pain Pain Assessment: 0-10 Pain Score: 7  Pain Location: L knee Pain Descriptors / Indicators: Operative site guarding Pain Intervention(s): Limited activity within patient's tolerance;Premedicated before session;Ice applied    Home Living                      Prior Function            PT Goals (current goals can now be found in the care plan section) Acute Rehab PT Goals Patient Stated Goal: go home PT Goal Formulation: With patient Time For Goal Achievement: 03/12/16 Potential to  Achieve Goals: Good Progress towards PT goals: Progressing toward goals    Frequency    BID      PT Plan Current plan remains appropriate    Co-evaluation             End of Session Equipment Utilized During Treatment: Gait belt Activity Tolerance: Patient tolerated treatment well Patient left: in chair;with call bell/phone within reach;with family/visitor present;with chair alarm set     Time: [id number] PT Time Calculation (min) (ACUTE ONLY): 40 min  Charges:  $Gait Training: 23-37 mins $Therapeutic Exercise: 8-22 mins                    G Codes:      Ceasar Decandia Mar 16, 2016, 10:39  AM  Greggory Stallion, PT, DPT [phone]

## 2016-03-01 NOTE — Care Management Note (Signed)
Case Management Note  Patient Details  Name: Kelly Wallace MRN: [id number] Date of Birth: 26-Sep-1950  Subjective/Objective:   Discharging today               Action/Plan: [facility] notified of discharge.   Expected Discharge Date:  03/01/16               Expected Discharge Plan:  [place]  In-House Referral:     Discharge planning Services  CM Consult  Post Acute Care Choice:  Home Health Choice offered to:  Patient  DME Arranged:    DME Agency:     HH Arranged:  PT [place]:  [facility] ([facility])  Status of Service:  Completed, signed off  If discussed at [employer] of Stay Meetings, dates discussed:    Additional Comments:  Jolly Mango, RN 03/01/2016, 9:02 AM

## 2016-03-01 NOTE — Discharge Instructions (Signed)

## 2016-03-01 NOTE — Progress Notes (Signed)
Pt being discharged home today. PIV removed. Discharge instructions reviewed with patient, all questions answered. Her prescriptions were given to her to be filled at her pharmacy. Home health has been set up with [facility]. Her follow up has been made in 2 weeks for staple removal. Dressing changed. She is leaving with all her belongings, will be transported home via family member.

## 2016-03-01 NOTE — Discharge Summary (Signed)
Physician Discharge Summary  [name] MRN: [id number] DOB/AGE: 03-09-1950 66 y.o.  Admit date: 02/27/2016 Discharge date: 03/01/2016  Admission Diagnoses:  PRIMARY LOCALIZED OSTEOARTHRITIS OF LEFT KNEE   Discharge Diagnoses: Patient Active Problem List   Diagnosis Date Noted  . Primary localized osteoarthritis of left knee 02/27/2016  . Abnormal CXR 08/31/2015  . Cough 08/03/2015  . Left knee pain 04/03/2015  . Health care maintenance 11/04/2014  . Knee pain, bilateral 02/27/2014  . Iron deficiency 03/01/2013  . Nipple discharge 12/26/2012  . Essential hypertension, benign 04/07/2012  . Elevated alkaline phosphatase level 04/07/2012  . Anemia 04/07/2012  . Personal history of colonic polyps 04/07/2012    Past Medical History:  Diagnosis Date  . Anemia    not currently being treated for this  . Arthritis   . History of colon polyps   . Hypertension      Transfusion: none   Consultants (if any):   Discharged Condition: Improved  Hospital Course: Kelly Wallace is an 66 y.o. female who was admitted 02/27/2016 with a diagnosis of left knee osteoarthritis and went to the operating room on 02/27/2016 and underwent the above named procedures.    Surgeries: Procedure(s): TOTAL KNEE ARTHROPLASTY on 02/27/2016 Patient tolerated the surgery well. Taken to PACU where she was stabilized and then transferred to the orthopedic floor.  Started on Lovenox 30 q 12 hrs. Foot pumps applied bilaterally at 80 mm. Heels elevated on bed with rolled towels. No evidence of DVT. Negative Homan. Physical therapy started on day #1 for gait training and transfer. OT started day #1 for ADL and assisted devices.  Patient's foley was d/c on day #1. Patient's IV was d/c on day #2.  On post op day #3 patient was stable and ready for discharge to home with HHPT.  Implants: Medacta GMK sphere 3 left 3 tibia with 105 stem with a 3 mm offset and 10 mm insert, 3 patella all components  cemented  She was given perioperative antibiotics:  Anti-infectives    Start     Dose/Rate Route Frequency Ordered Stop   02/27/16 1330  ceFAZolin (ANCEF) IVPB 2g/100 mL premix  Status:  Discontinued     2 g 200 mL/hr over 30 Minutes Intravenous Every 6 hours 02/27/16 1312 02/27/16 1320   02/27/16 1330  ceFAZolin (ANCEF) IVPB 2 g/50 mL premix     2 g 100 mL/hr over 30 Minutes Intravenous Every 6 hours 02/27/16 1320 02/28/16 0400   02/27/16 0723  ceFAZolin (ANCEF) 2-4 GM/100ML-% IVPB    Comments:  Veda Canning: cabinet override      02/27/16 0723 02/27/16 1010   02/26/16 2200  ceFAZolin (ANCEF) IVPB 2g/100 mL premix     2 g 200 mL/hr over 30 Minutes Intravenous  Once 02/26/16 2153 02/27/16 1020    .  She was given sequential compression devices, early ambulation, and Llovenox for DVT prophylaxis.  She benefited maximally from the hospital stay and there were no complications.    Recent vital signs:  Vitals:   02/29/16 2012 03/01/16 0339  BP: (!) 155/79 (!) 144/73  Pulse: 89 96  Resp: 16 16  Temp: 98.7 F (37.1 C) 98.5 F (36.9 C)    Recent laboratory studies:  Lab Results  Component Value Date   HGB 9.3 (L) 02/29/2016   HGB 9.2 (L) 02/28/2016   HGB 10.5 (L) 02/27/2016   Lab Results  Component Value Date   WBC 9.3 02/29/2016   PLT  179 02/29/2016   Lab Results  Component Value Date   INR 1.14 02/21/2016   Lab Results  Component Value Date   NA 136 02/29/2016   K 3.4 (L) 02/29/2016   CL 106 02/29/2016   CO2 24 02/29/2016   BUN 7 02/29/2016   CREATININE 0.66 02/29/2016   GLUCOSE 133 (H) 02/29/2016    Discharge Medications:   Allergies as of 03/01/2016   No Known Allergies     Medication List    STOP taking these medications   naproxen sodium 220 MG tablet Commonly known as:  ANAPROX     TAKE these medications   enoxaparin 40 MG/0.4ML injection Commonly known as:  LOVENOX Inject 0.4 mLs (40 mg total) into the skin daily.   lisinopril 10 MG  tablet Commonly known as:  PRINIVIL,ZESTRIL Take 1 tablet (10 mg total) by mouth daily.   oxyCODONE 5 MG immediate release tablet Commonly known as:  Oxy IR/ROXICODONE Take 1-2 tablets (5-10 mg total) by mouth every 3 (three) hours as needed for breakthrough pain.            Durable Medical Equipment        Start     Ordered   02/27/16 1313  DME Walker rolling  Once    Question:  Patient needs a walker to treat with the following condition  Answer:  Status post total knee replacement using cement, left   02/27/16 1312   02/27/16 1313  DME 3 n 1  Once     02/27/16 1312   02/27/16 1313  DME Bedside commode  Once    Question:  Patient needs a bedside commode to treat with the following condition  Answer:  Status post total knee replacement using cement, left   02/27/16 1312      Diagnostic Studies: Dg Knee 1-2 Views Left  Result Date: 02/27/2016 CLINICAL DATA:  Post total knee arthroplasty EXAM: LEFT KNEE - 1-2 VIEW COMPARISON:  CT LEFT knee 01/18/2016 FINDINGS: Osseous demineralization. Components of LEFT knee prosthesis are identified. No dislocation or bone destruction. Linear lucency is seen at the medial margin of the medial tibial plateau, immediately caudal to the lip of the tibial component of the prosthesis, question nondisplaced fracture ; patient had a subchondral geode at this position on the prior CT exam. No additional fractures or bone destruction identified. Anterior skin clips and expected soft tissue changes with surgery. Superimposed dressing artifacts. IMPRESSION: LEFT knee prosthesis with osseous demineralization and a questionable nondisplaced fracture at the medial margin of the medial tibial plateau. Findings called to Leo Rod in PACU on 02/27/2016 at 1238 hours. Electronically Signed   By: Lavonia Dana M.D.   On: 02/27/2016 12:39    Disposition: 01-Home or [place], MD Follow up in 2 week(s).   Specialty:   Orthopedic Surgery Contact information: [address] [place] [address] [phone]            Signed: Feliberto Gottron 03/01/2016, 7:40 AM

## 2016-03-02 DIAGNOSIS — Z7901 Long term (current) use of anticoagulants: Secondary | ICD-10-CM | POA: Diagnosis not present

## 2016-03-02 DIAGNOSIS — Z79891 Long term (current) use of opiate analgesic: Secondary | ICD-10-CM | POA: Diagnosis not present

## 2016-03-02 DIAGNOSIS — Z471 Aftercare following joint replacement surgery: Secondary | ICD-10-CM | POA: Diagnosis not present

## 2016-03-02 DIAGNOSIS — I10 Essential (primary) hypertension: Secondary | ICD-10-CM | POA: Diagnosis not present

## 2016-03-02 DIAGNOSIS — Z96653 Presence of artificial knee joint, bilateral: Secondary | ICD-10-CM | POA: Diagnosis not present

## 2016-03-04 ENCOUNTER — Encounter: Payer: Self-pay | Admitting: *Deleted

## 2016-03-04 ENCOUNTER — Other Ambulatory Visit: Payer: Self-pay | Admitting: *Deleted

## 2016-03-04 DIAGNOSIS — Z96653 Presence of artificial knee joint, bilateral: Secondary | ICD-10-CM | POA: Diagnosis not present

## 2016-03-04 DIAGNOSIS — Z7901 Long term (current) use of anticoagulants: Secondary | ICD-10-CM | POA: Diagnosis not present

## 2016-03-04 DIAGNOSIS — I10 Essential (primary) hypertension: Secondary | ICD-10-CM | POA: Diagnosis not present

## 2016-03-04 DIAGNOSIS — Z471 Aftercare following joint replacement surgery: Secondary | ICD-10-CM | POA: Diagnosis not present

## 2016-03-04 DIAGNOSIS — Z79891 Long term (current) use of opiate analgesic: Secondary | ICD-10-CM | POA: Diagnosis not present

## 2016-03-04 NOTE — Patient Outreach (Signed)
[facility]) Care Management  03/04/2016  Kelly Wallace Jun 08, 1950 [id number]   Subjective: Telephone call to patient's home number, spoke with patient, and HIPAA verified.   Discussed [facility] Care Management [facility]MR Transition of care follow up, patient voiced understanding, and is in agreement to complete follow up. Patient states she is doing well, has scheduled a follow up appointment with surgeon and primary MD in 2 weeks.  States she has verified that she does not have the hospital indemnity supplemental insurance and does have family medical leave act (FMLA) in place.  Patient states she does not have any transition of care, care coordination, disease management, disease monitoring, transportation, community resource, or pharmacy needs at this time.   States she is very appreciative of the follow up call and is in agreement to receive [place] Management information.  Objective:  Per chart review, patient hospitalized 02/27/16 -03/01/16 for PRIMARY LOCALIZED OSTEOARTHRITIS OF LEFT KNEE.  Status post  TOTAL KNEE ARTHROPLASTY (Left) on 02/27/16.    Patient also has a history of hypertension.   [facility] Care Management preop call completed on 02/14/16.     Assessment: Received UMR Transition of care referral from [place] census report on 02/27/16.   Transition of care referral completed, no care management needs, and will proceed with case closure.   Plan: RNCM will send patient successful outreach letter, [facility] pamphlet, and magnet. RNCM will send case closure due to follow up completed / no care management needs request to [facility] Management.   Marissa Weaver H. Annia Friendly, BSN, [place] Management [facility] Telephonic CM Phone: [phone] Fax: [phone]

## 2016-03-06 DIAGNOSIS — Z96653 Presence of artificial knee joint, bilateral: Secondary | ICD-10-CM | POA: Diagnosis not present

## 2016-03-06 DIAGNOSIS — Z79891 Long term (current) use of opiate analgesic: Secondary | ICD-10-CM | POA: Diagnosis not present

## 2016-03-06 DIAGNOSIS — Z471 Aftercare following joint replacement surgery: Secondary | ICD-10-CM | POA: Diagnosis not present

## 2016-03-06 DIAGNOSIS — I10 Essential (primary) hypertension: Secondary | ICD-10-CM | POA: Diagnosis not present

## 2016-03-06 DIAGNOSIS — Z7901 Long term (current) use of anticoagulants: Secondary | ICD-10-CM | POA: Diagnosis not present

## 2016-03-07 DIAGNOSIS — Z96653 Presence of artificial knee joint, bilateral: Secondary | ICD-10-CM | POA: Diagnosis not present

## 2016-03-07 DIAGNOSIS — Z471 Aftercare following joint replacement surgery: Secondary | ICD-10-CM | POA: Diagnosis not present

## 2016-03-07 DIAGNOSIS — Z7901 Long term (current) use of anticoagulants: Secondary | ICD-10-CM | POA: Diagnosis not present

## 2016-03-07 DIAGNOSIS — Z79891 Long term (current) use of opiate analgesic: Secondary | ICD-10-CM | POA: Diagnosis not present

## 2016-03-07 DIAGNOSIS — I10 Essential (primary) hypertension: Secondary | ICD-10-CM | POA: Diagnosis not present

## 2016-03-11 DIAGNOSIS — Z79891 Long term (current) use of opiate analgesic: Secondary | ICD-10-CM | POA: Diagnosis not present

## 2016-03-11 DIAGNOSIS — Z96653 Presence of artificial knee joint, bilateral: Secondary | ICD-10-CM | POA: Diagnosis not present

## 2016-03-11 DIAGNOSIS — Z7901 Long term (current) use of anticoagulants: Secondary | ICD-10-CM | POA: Diagnosis not present

## 2016-03-11 DIAGNOSIS — I10 Essential (primary) hypertension: Secondary | ICD-10-CM | POA: Diagnosis not present

## 2016-03-11 DIAGNOSIS — Z471 Aftercare following joint replacement surgery: Secondary | ICD-10-CM | POA: Diagnosis not present

## 2016-03-12 DIAGNOSIS — I10 Essential (primary) hypertension: Secondary | ICD-10-CM | POA: Diagnosis not present

## 2016-03-12 DIAGNOSIS — Z471 Aftercare following joint replacement surgery: Secondary | ICD-10-CM | POA: Diagnosis not present

## 2016-03-12 DIAGNOSIS — Z79891 Long term (current) use of opiate analgesic: Secondary | ICD-10-CM | POA: Diagnosis not present

## 2016-03-12 DIAGNOSIS — Z96653 Presence of artificial knee joint, bilateral: Secondary | ICD-10-CM | POA: Diagnosis not present

## 2016-03-12 DIAGNOSIS — Z7901 Long term (current) use of anticoagulants: Secondary | ICD-10-CM | POA: Diagnosis not present

## 2016-03-13 DIAGNOSIS — Z96652 Presence of left artificial knee joint: Secondary | ICD-10-CM | POA: Diagnosis not present

## 2016-03-15 DIAGNOSIS — Z96652 Presence of left artificial knee joint: Secondary | ICD-10-CM | POA: Diagnosis not present

## 2016-03-21 DIAGNOSIS — Z96652 Presence of left artificial knee joint: Secondary | ICD-10-CM | POA: Diagnosis not present

## 2016-03-25 DIAGNOSIS — Z96652 Presence of left artificial knee joint: Secondary | ICD-10-CM | POA: Diagnosis not present

## 2016-04-04 ENCOUNTER — Ambulatory Visit (INDEPENDENT_AMBULATORY_CARE_PROVIDER_SITE_OTHER): Payer: 59 | Admitting: Internal Medicine

## 2016-04-04 ENCOUNTER — Encounter: Payer: Self-pay | Admitting: Internal Medicine

## 2016-04-04 VITALS — BP 140/72 | HR 87 | Temp 97.7°F | Resp 16 | Ht 63.0 in | Wt 229.0 lb

## 2016-04-04 DIAGNOSIS — Z13220 Encounter for screening for lipoid disorders: Secondary | ICD-10-CM | POA: Diagnosis not present

## 2016-04-04 DIAGNOSIS — I10 Essential (primary) hypertension: Secondary | ICD-10-CM | POA: Diagnosis not present

## 2016-04-04 DIAGNOSIS — D649 Anemia, unspecified: Secondary | ICD-10-CM

## 2016-04-04 DIAGNOSIS — E611 Iron deficiency: Secondary | ICD-10-CM

## 2016-04-04 DIAGNOSIS — R748 Abnormal levels of other serum enzymes: Secondary | ICD-10-CM

## 2016-04-04 DIAGNOSIS — M1712 Unilateral primary osteoarthritis, left knee: Secondary | ICD-10-CM | POA: Diagnosis not present

## 2016-04-04 DIAGNOSIS — Z96652 Presence of left artificial knee joint: Secondary | ICD-10-CM | POA: Diagnosis not present

## 2016-04-04 DIAGNOSIS — Z1231 Encounter for screening mammogram for malignant neoplasm of breast: Secondary | ICD-10-CM

## 2016-04-04 DIAGNOSIS — Z0000 Encounter for general adult medical examination without abnormal findings: Secondary | ICD-10-CM

## 2016-04-04 DIAGNOSIS — Z1239 Encounter for other screening for malignant neoplasm of breast: Secondary | ICD-10-CM

## 2016-04-04 MED ORDER — LISINOPRIL 10 MG PO TABS: 10.0000 mg | ORAL_TABLET | Freq: Every day | ORAL | 2 refills | Status: DC

## 2016-04-04 NOTE — Assessment & Plan Note (Signed)
Have discussed with hematology.  Felt since stable.  Follow.  Recheck liver panel.   

## 2016-04-04 NOTE — Assessment & Plan Note (Signed)
s/p knee surgery.  Going to therapy.  Follow.

## 2016-04-04 NOTE — Assessment & Plan Note (Signed)
Physical today 04/04/16.  Colonoscopy 06/2011 internal hemorrhoids.  Recommended f/u colonoscopy in 2018.  PAP 02/24/13 negative with negative HPV.  Mammogram scheduled.

## 2016-04-04 NOTE — Progress Notes (Signed)
[name], female   DOB: [birth year], 66 y.o.   MRN: [id number]   Subjective:    [name], female    DOB: 1950/10/29, 66 y.o.   MRN: [id number]  HPI  Patient with past history of hypertension and anemia.  She comes in today to follow up on these issues as well as for a complete physical exam.  She had total knee arthroplasty left knee 02/27/16.  Doing well.  Going to therapy.  No significant pain.  No chest pain.  No sob.  No acid reflux.  No abdominal pain.  Bowels moving. No blood.  Overall feels good.     Past Medical History:  Diagnosis Date  . Anemia    not currently being treated for this  . Arthritis   . History of colon polyps   . Hypertension    Past Surgical History:  Procedure Laterality Date  . CHOLECYSTECTOMY  1989  . GASTRIC BYPASS  03/21/03  . JOINT REPLACEMENT Right 04/2014   TOTAL KNEE REPLACEMENT, DR. Rudene Christians, [place]  . TOTAL KNEE ARTHROPLASTY Left 02/27/2016   Procedure: TOTAL KNEE ARTHROPLASTY;  Surgeon: Hessie Knows, MD;  Location: [facility] ORS;  Service: Orthopedics;  Laterality: Left;  . TUBAL LIGATION  1976   Family History  Problem Relation Age of Onset  . Hypertension Mother   . Diabetes Mother   . Cancer Maternal Aunt     question of type  . Cancer Maternal Grandmother     colon  . Breast cancer Neg Hx    Social History   Social History  . Marital status: Married    Spouse name: N/A  . Number of children: 2  . Years of education: N/A   Occupational History  .  [employer]   Social History Main Topics  . Smoking status: Never Smoker  . Smokeless tobacco: Never Used  . Alcohol use No  . Drug use: No  . Sexual activity: Yes   Other Topics Concern  . None   Social History Narrative  . None    Outpatient Encounter Prescriptions as of 04/04/2016  Medication Sig  . lisinopril (PRINIVIL,ZESTRIL) 10 MG tablet Take 1 tablet (10 mg total) by mouth daily.  Marland Kitchen oxyCODONE (OXY IR/ROXICODONE) 5 MG immediate release tablet Take 1-2  tablets (5-10 mg total) by mouth every 3 (three) hours as needed for breakthrough pain.  . [DISCONTINUED] lisinopril (PRINIVIL,ZESTRIL) 10 MG tablet Take 1 tablet (10 mg total) by mouth daily.  . [DISCONTINUED] docusate sodium (COLACE) 100 MG capsule Take 100 mg by mouth daily as needed for mild constipation.  . [DISCONTINUED] enoxaparin (LOVENOX) 40 MG/0.4ML injection Inject 0.4 mLs (40 mg total) into the skin daily.   No facility-administered encounter medications on file as of 04/04/2016.     Review of Systems  Constitutional: Negative for appetite change and unexpected weight change.  HENT: Negative for congestion and sinus pressure.   Eyes: Negative for pain and visual disturbance.  Respiratory: Negative for cough, chest tightness and shortness of breath.   Cardiovascular: Negative for chest pain and palpitations.  Gastrointestinal: Negative for abdominal pain, diarrhea, nausea and vomiting.  Genitourinary: Negative for difficulty urinating and dysuria.  Musculoskeletal: Negative for back pain and joint swelling.       Knee is doing well s/p surgery.   Skin: Negative for color change and rash.  Neurological: Negative for dizziness and headaches.  Hematological: Negative for adenopathy. Does not bruise/bleed easily.  Psychiatric/Behavioral: Negative for agitation  and dysphoric mood.       Objective:    Physical Exam  Constitutional: She is oriented to person, place, and time. She appears well-developed and well-nourished. No distress.  HENT:  Nose: Nose normal.  Mouth/Throat: Oropharynx is clear and moist.  Eyes: Right eye exhibits no discharge. Left eye exhibits no discharge. No scleral icterus.  Neck: Neck supple. No thyromegaly present.  Cardiovascular: Normal rate and regular rhythm.   Pulmonary/Chest: Breath sounds normal. No accessory muscle usage. No tachypnea. No respiratory distress. She has no decreased breath sounds. She has no wheezes. She has no rhonchi. Right  breast exhibits no inverted nipple, no mass, no nipple discharge and no tenderness (no axillary adenopathy). Left breast exhibits no inverted nipple, no mass, no nipple discharge and no tenderness (no axilarry adenopathy).  Abdominal: Soft. Bowel sounds are normal. There is no tenderness.  Musculoskeletal: She exhibits no edema or tenderness.  Lymphadenopathy:    She has no cervical adenopathy.  Neurological: She is alert and oriented to person, place, and time.  Skin: Skin is warm. No rash noted. No erythema.  Psychiatric: She has a normal mood and affect. Her behavior is normal.    BP 140/72 (BP Location: Left Arm, Patient Position: Sitting, Cuff Size: Large)   Pulse 87   Temp 97.7 F (36.5 C) (Oral)   Resp 16   Ht 5\' 3"  (1.6 m)   Wt 229 lb (103.9 kg)   LMP 04/06/2009   SpO2 98%   BMI 40.57 kg/m  Wt Readings from Last 3 Encounters:  04/04/16 229 lb (103.9 kg)  02/27/16 233 lb (105.7 kg)  02/21/16 230 lb (104.3 kg)     Lab Results  Component Value Date   WBC 9.3 02/29/2016   HGB 9.3 (L) 02/29/2016   HCT 27.9 (L) 02/29/2016   PLT 179 02/29/2016   GLUCOSE 133 (H) 02/29/2016   CHOL 90 10/25/2014   TRIG 53.0 10/25/2014   HDL 53.00 10/25/2014   LDLCALC 27 10/25/2014   ALT 16 12/04/2015   AST 22 12/04/2015   NA 136 02/29/2016   K 3.4 (L) 02/29/2016   CL 106 02/29/2016   CREATININE 0.66 02/29/2016   BUN 7 02/29/2016   CO2 24 02/29/2016   TSH 1.63 08/03/2015   INR 1.14 02/21/2016       Assessment & Plan:   Problem List Items Addressed This Visit    Anemia    Recheck cbc and ferritin.  Check b12.        Relevant Orders   CBC with Differential/Platelet   Ferritin   Vitamin B12   Elevated alkaline phosphatase level    Have discussed with hematology.  Felt since stable.  Follow.  Recheck liver panel.        Relevant Orders   Hepatic function panel   TSH   Basic metabolic panel   Essential hypertension, benign    Blood pressure slightly elevated today.  Has  been under good control.  Same medication regimen. States taking regularly.  Follow pressures.  Notify me if elevation.  Check metabolic panel       Relevant Medications   lisinopril (PRINIVIL,ZESTRIL) 10 MG tablet   Health care maintenance    Physical today 04/04/16.  Colonoscopy 06/2011 internal hemorrhoids.  Recommended f/u colonoscopy in 2018.  PAP 02/24/13 negative with negative HPV.  Mammogram scheduled.        Iron deficiency    Not taking iron supplements.  Recheck cbc and ferritin.  Primary localized osteoarthritis of left knee    s/p knee surgery.  Going to therapy.  Follow.         Other Visit Diagnoses    Screening for breast cancer    -  Primary   Relevant Orders   MM DIGITAL SCREENING BILATERAL   Screening cholesterol level       Relevant Orders   Lipid panel       Einar Pheasant, MD

## 2016-04-04 NOTE — Assessment & Plan Note (Signed)
Not taking iron supplements.  Recheck cbc and ferritin.   

## 2016-04-04 NOTE — Assessment & Plan Note (Signed)
Blood pressure slightly elevated today.  Has been under good control.  Same medication regimen. States taking regularly.  Follow pressures.  Notify me if elevation.  Check metabolic panel

## 2016-04-04 NOTE — Assessment & Plan Note (Signed)
Recheck cbc and ferritin.  Check b12.

## 2016-04-04 NOTE — Progress Notes (Signed)
Pre-visit discussion using our clinic review tool. No additional management support is needed unless otherwise documented below in the visit note.  

## 2016-04-09 DIAGNOSIS — Z96652 Presence of left artificial knee joint: Secondary | ICD-10-CM | POA: Diagnosis not present

## 2016-04-10 DIAGNOSIS — Z96652 Presence of left artificial knee joint: Secondary | ICD-10-CM | POA: Diagnosis not present

## 2016-04-12 ENCOUNTER — Other Ambulatory Visit: Payer: 59

## 2016-04-16 DIAGNOSIS — Z96652 Presence of left artificial knee joint: Secondary | ICD-10-CM | POA: Diagnosis not present

## 2016-07-05 ENCOUNTER — Ambulatory Visit: Payer: 59 | Admitting: Internal Medicine

## 2016-07-05 DIAGNOSIS — Z0289 Encounter for other administrative examinations: Secondary | ICD-10-CM

## 2016-07-16 ENCOUNTER — Ambulatory Visit: Payer: 59 | Admitting: Family

## 2016-07-22 ENCOUNTER — Ambulatory Visit (INDEPENDENT_AMBULATORY_CARE_PROVIDER_SITE_OTHER): Payer: 59 | Admitting: Family

## 2016-07-22 ENCOUNTER — Encounter: Payer: Self-pay | Admitting: Family

## 2016-07-22 VITALS — BP 145/90 | HR 88 | Temp 98.1°F | Ht 63.0 in | Wt 231.2 lb

## 2016-07-22 DIAGNOSIS — G8929 Other chronic pain: Secondary | ICD-10-CM

## 2016-07-22 DIAGNOSIS — M25562 Pain in left knee: Secondary | ICD-10-CM | POA: Diagnosis not present

## 2016-07-22 DIAGNOSIS — M25561 Pain in right knee: Secondary | ICD-10-CM | POA: Diagnosis not present

## 2016-07-22 DIAGNOSIS — I10 Essential (primary) hypertension: Secondary | ICD-10-CM

## 2016-07-22 MED ORDER — MELOXICAM 7.5 MG PO TABS: 7.5000 mg | ORAL_TABLET | Freq: Every day | ORAL | 0 refills | Status: DC

## 2016-07-22 MED ORDER — LISINOPRIL 10 MG PO TABS: 10.0000 mg | ORAL_TABLET | Freq: Every day | ORAL | 0 refills | Status: DC

## 2016-07-22 NOTE — Progress Notes (Signed)
Pre visit review using our clinic review tool, if applicable. No additional management support is needed unless otherwise documented below in the visit note. 

## 2016-07-22 NOTE — Assessment & Plan Note (Signed)
Elevated. Hasn't taken lisinopril today. Will come back for labs tomorrow including cmp.

## 2016-07-22 NOTE — Progress Notes (Signed)
Subjective:    [name], female    DOB: 05-Nov-1950, 66 y.o.   MRN: [id number]  CC: Kelly Wallace is a 66 y.o. female who presents today for an acute visit.    HPI: CC: bilateral knee pain x for several weeks, waxing and waning.   left knee pain, had gotten better after surgery.   However pain recurred when went back to work. She also notes she was unable to do recommended PT due to cost after surgery.  Knees Bother her more when walking at work. Swelling worse at end of day. No swelling today since off work.  Swelling in both legs improves with elevated. Ibuprofen with some relief.   No compression stockings.   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 Has been occurring for several months, unchanged.   Works as [employer]   No h/o blood clots, chest pain, palpitations. No h/o GIB, CKD.   H/o R TKR.   HTN- hasn't taken lisinopril today. Denies exertional chest pain or pressure, numbness or tingling radiating to left arm or jaw, palpitations, dizziness, frequent headaches, changes in vision.    Arthroplasty left knee 02/2016 HISTORY:  Past Medical History:  Diagnosis Date  . Anemia    not currently being treated for this  . Arthritis   . History of colon polyps   . Hypertension    Past Surgical History:  Procedure Laterality Date  . CHOLECYSTECTOMY  1989  . GASTRIC BYPASS  03/21/03  . JOINT REPLACEMENT Right 04/2014   TOTAL KNEE REPLACEMENT, DR. Rudene Christians, [place]  . TOTAL KNEE ARTHROPLASTY Left 02/27/2016   Procedure: TOTAL KNEE ARTHROPLASTY;  Surgeon: Hessie Knows, MD;  Location: [facility] ORS;  Service: Orthopedics;  Laterality: Left;  . TUBAL LIGATION  1976   Family History  Problem Relation Age of Onset  . Hypertension Mother   . Diabetes Mother   . Cancer Maternal Aunt        question of type  . Cancer Maternal Grandmother        colon  . Breast cancer Neg Hx     Allergies: Patient has no known allergies. No current outpatient prescriptions  on file prior to visit.   No current facility-administered medications on file prior to visit.     Social History  Substance Use Topics  . Smoking status: Never Smoker  . Smokeless tobacco: Never Used  . Alcohol use No    Review of Systems  Constitutional: Negative for chills and fever.  Eyes: Negative for visual disturbance.  Respiratory: Positive for shortness of breath (chronic). Negative for cough.   Cardiovascular: Positive for leg swelling. Negative for chest pain and palpitations.  Gastrointestinal: Negative for nausea and vomiting.  Musculoskeletal: Positive for joint swelling.      Objective:    BP (!) 145/90   Pulse 88   Temp 98.1 F (36.7 C) (Oral)   Ht 5\' 3"  (1.6 m)   Wt 231 lb 3.2 oz (104.9 kg)   LMP 04/06/2009   SpO2 96%   BMI 40.96 kg/m    Physical Exam  Constitutional: She appears well-developed and well-nourished.  Eyes: Conjunctivae are normal.  Cardiovascular: Normal rate, regular rhythm, normal heart sounds and normal pulses.   No LE edema, palpable cords or masses. No erythema or increased warmth. No asymmetry in calf size when compared bilaterally LE hair growth symmetric and present. No discoloration of varicosities noted. LE warm and palpable pedal pulses.  Pulmonary/Chest: Effort normal and breath sounds normal. She has no wheezes. She has no rhonchi. She has no rales.  Musculoskeletal:       Right knee: She exhibits normal range of motion, no swelling, no effusion, no ecchymosis and no erythema. No tenderness found.       Left knee: She exhibits normal range of motion, no swelling, no effusion, no deformity, no laceration and no erythema. No tenderness found.  Bilateral knees are symmetric. No effusion appreciated. No increase in warmth or erythema. Crepitus felt with flexion of bilateral knees.  Right and  Left knee:  Able to extend to -5 to 10 degrees and flex to 110 degrees. No catching with McMurray maneuver. No patellar apprehension.  Negative anterior drawer and lachman's- no laxity appreciated.  No calf tenderness of lower leg edema bilaterally.    Neurological: She is alert.  Skin: Skin is warm and dry.  Psychiatric: She has a normal mood and affect. Her speech is normal and behavior is normal. Thought content normal.  Vitals reviewed.      Assessment & Plan:   Problem List Items Addressed This Visit      Cardiovascular and Mediastinum   Essential hypertension, benign    Elevated. Hasn't taken lisinopril today. Will come back for labs tomorrow including cmp.       Relevant Medications   lisinopril (PRINIVIL,ZESTRIL) 10 MG tablet   meloxicam (MOBIC) 7.5 MG tablet     Other   Knee pain, bilateral - Primary    Suspect osteoarthritic in nature. Trial of mobic. Advised ice after shift at work. Provided work note as requested.  Note: suspect sob related to deconditioning, overweight.Wells criteria dvt 0.       Relevant Medications   lisinopril (PRINIVIL,ZESTRIL) 10 MG tablet        I have discontinued [name]'s oxyCODONE. I am also having her start on meloxicam. Additionally, I am having her maintain her lisinopril.   Meds ordered this encounter  Medications  . lisinopril (PRINIVIL,ZESTRIL) 10 MG tablet    Sig: Take 1 tablet (10 mg total) by mouth daily.    Dispense:  90 tablet    Refill:  0    Order Specific Question:   Supervising Provider    Answer:   Deborra Medina L [[id number]]  . meloxicam (MOBIC) 7.5 MG tablet    Sig: Take 1 tablet (7.5 mg total) by mouth daily.    Dispense:  60 tablet    Refill:  0    Order Specific Question:   Supervising Provider    Answer:   Crecencio Mc [[id number]]    Return precautions given.   Risks, benefits, and alternatives of the medications and treatment plan prescribed today were discussed, and patient expressed understanding.   Education regarding symptom management and diagnosis given to patient on AVS.  Continue to follow with Einar Pheasant, MD for  routine health maintenance.   Kelly Wallace and I agreed with plan.   [name], FNP

## 2016-07-22 NOTE — Patient Instructions (Addendum)
Come in morning for fasting labs.   Please make an appointment at check out. 6 hour fast.  Ice after work shift  Trial of mobic for pain. MUST TAKE WITH Food. No not take any other advil or ibuprofen as this medication is related to those over the counter medications.   ACE wrap  Compression stockings  Let us know if not better

## 2016-07-22 NOTE — Assessment & Plan Note (Addendum)
Suspect osteoarthritic in nature. Trial of mobic. Advised ice after shift at work. Provided work note as requested.  Note: suspect sob related to deconditioning, overweight.Wells criteria dvt 0.

## 2016-07-23 ENCOUNTER — Other Ambulatory Visit (INDEPENDENT_AMBULATORY_CARE_PROVIDER_SITE_OTHER): Payer: 59

## 2016-07-23 DIAGNOSIS — R748 Abnormal levels of other serum enzymes: Secondary | ICD-10-CM | POA: Diagnosis not present

## 2016-07-23 DIAGNOSIS — Z13220 Encounter for screening for lipoid disorders: Secondary | ICD-10-CM

## 2016-07-23 DIAGNOSIS — D649 Anemia, unspecified: Secondary | ICD-10-CM | POA: Diagnosis not present

## 2016-07-23 LAB — HEPATIC FUNCTION PANEL
ALT: 9 U/L (ref 0–35)
AST: 15 U/L (ref 0–37)
Albumin: 4 g/dL (ref 3.5–5.2)
Alkaline Phosphatase: 169 U/L — ABNORMAL HIGH (ref 39–117)
Bilirubin, Direct: 0.1 mg/dL (ref 0.0–0.3)
Total Bilirubin: 0.5 mg/dL (ref 0.2–1.2)
Total Protein: 7.3 g/dL (ref 6.0–8.3)

## 2016-07-23 LAB — CBC WITH DIFFERENTIAL/PLATELET
Basophils Absolute: 0 10*3/uL (ref 0.0–0.1)
Basophils Relative: 0.7 % (ref 0.0–3.0)
Eosinophils Absolute: 0.2 10*3/uL (ref 0.0–0.7)
Eosinophils Relative: 3.4 % (ref 0.0–5.0)
HCT: 31.3 % — ABNORMAL LOW (ref 36.0–46.0)
Hemoglobin: 9.9 g/dL — ABNORMAL LOW (ref 12.0–15.0)
Lymphocytes Relative: 41.6 % (ref 12.0–46.0)
Lymphs Abs: 2.2 10*3/uL (ref 0.7–4.0)
MCHC: 31.6 g/dL (ref 30.0–36.0)
MCV: 80.1 fl (ref 78.0–100.0)
Monocytes Absolute: 0.4 10*3/uL (ref 0.1–1.0)
Monocytes Relative: 6.6 % (ref 3.0–12.0)
Neutro Abs: 2.6 10*3/uL (ref 1.4–7.7)
Neutrophils Relative %: 47.7 % (ref 43.0–77.0)
Platelets: 231 10*3/uL (ref 150.0–400.0)
RBC: 3.91 Mil/uL (ref 3.87–5.11)
RDW: 17.3 % — ABNORMAL HIGH (ref 11.5–15.5)
WBC: 5.4 10*3/uL (ref 4.0–10.5)

## 2016-07-23 LAB — TSH: TSH: 1.05 u[IU]/mL (ref 0.35–4.50)

## 2016-07-23 LAB — BASIC METABOLIC PANEL
BUN: 12 mg/dL (ref 6–23)
CO2: 24 mEq/L (ref 19–32)
Calcium: 8.9 mg/dL (ref 8.4–10.5)
Chloride: 108 mEq/L (ref 96–112)
Creatinine, Ser: 0.62 mg/dL (ref 0.40–1.20)
GFR: 124.57 mL/min (ref >60.00)
Glucose, Bld: 104 mg/dL — ABNORMAL HIGH (ref 70–99)
Potassium: 3.7 mEq/L (ref 3.5–5.1)
Sodium: 139 mEq/L (ref 135–145)

## 2016-07-23 LAB — LIPID PANEL
Cholesterol: 104 mg/dL (ref 0–200)
HDL: 50.4 mg/dL (ref >39.00)
LDL Cholesterol: 43 mg/dL (ref 0–99)
NonHDL: 53.55
Total CHOL/HDL Ratio: 2
Triglycerides: 54 mg/dL (ref 0.0–149.0)
VLDL: 10.8 mg/dL (ref 0.0–40.0)

## 2016-07-23 LAB — FERRITIN: Ferritin: 6.2 ng/mL — ABNORMAL LOW (ref 10.0–291.0)

## 2016-07-23 LAB — VITAMIN B12: Vitamin B-12: 124 pg/mL — ABNORMAL LOW (ref 211–911)

## 2016-07-24 ENCOUNTER — Other Ambulatory Visit: Payer: Self-pay | Admitting: Internal Medicine

## 2016-07-24 DIAGNOSIS — R739 Hyperglycemia, unspecified: Secondary | ICD-10-CM

## 2016-07-24 DIAGNOSIS — D649 Anemia, unspecified: Secondary | ICD-10-CM

## 2016-07-24 NOTE — Progress Notes (Signed)
Order placed for f/u labs.  

## 2016-07-30 ENCOUNTER — Ambulatory Visit: Payer: 59

## 2016-08-06 ENCOUNTER — Ambulatory Visit (INDEPENDENT_AMBULATORY_CARE_PROVIDER_SITE_OTHER): Payer: 59 | Admitting: *Deleted

## 2016-08-06 DIAGNOSIS — E538 Deficiency of other specified B group vitamins: Secondary | ICD-10-CM | POA: Diagnosis not present

## 2016-08-06 MED ORDER — CYANOCOBALAMIN 1000 MCG/ML IJ SOLN
1000.0000 ug | Freq: Once | INTRAMUSCULAR | Status: AC
  Administered 2016-08-06: 1000 ug via INTRAMUSCULAR

## 2016-08-06 NOTE — Progress Notes (Addendum)
Patient presented for B 12 injection to left deltoid, patient voiced no concerns nor showed any signs of distress during injection, lab note 07/22/16 first of weekly injection.  Noted, margaret Arnett np

## 2016-08-13 ENCOUNTER — Ambulatory Visit (INDEPENDENT_AMBULATORY_CARE_PROVIDER_SITE_OTHER): Payer: 59 | Admitting: *Deleted

## 2016-08-13 DIAGNOSIS — E538 Deficiency of other specified B group vitamins: Secondary | ICD-10-CM

## 2016-08-13 MED ORDER — CYANOCOBALAMIN 1000 MCG/ML IJ SOLN
1000.0000 ug | Freq: Once | INTRAMUSCULAR | Status: AC
  Administered 2016-08-13: 1000 ug via INTRAMUSCULAR

## 2016-08-13 NOTE — Progress Notes (Addendum)
Patient presented for B 12 injection to right deltoid, patient voiced no concerns nor showed any signs of distress during injection.  Reviewed.  Dr Scott 

## 2016-08-20 ENCOUNTER — Ambulatory Visit (INDEPENDENT_AMBULATORY_CARE_PROVIDER_SITE_OTHER): Payer: 59

## 2016-08-20 DIAGNOSIS — E538 Deficiency of other specified B group vitamins: Secondary | ICD-10-CM

## 2016-08-20 MED ORDER — CYANOCOBALAMIN 1000 MCG/ML IJ SOLN
1000.0000 ug | Freq: Once | INTRAMUSCULAR | Status: AC
  Administered 2016-08-20: 1000 ug via INTRAMUSCULAR

## 2016-08-20 NOTE — Progress Notes (Addendum)
Patient presented for her 3rd weekly B 12 injection to left deltoid, patient voiced no concerns or showed any signs of distress during injection. Patient has one more weekly injection due to she missed her previous appointment.   Reviewed.   Dr Nicki Reaper

## 2016-08-21 ENCOUNTER — Ambulatory Visit: Payer: 59

## 2016-08-23 ENCOUNTER — Other Ambulatory Visit (INDEPENDENT_AMBULATORY_CARE_PROVIDER_SITE_OTHER): Payer: 59

## 2016-08-23 DIAGNOSIS — R739 Hyperglycemia, unspecified: Secondary | ICD-10-CM | POA: Diagnosis not present

## 2016-08-23 DIAGNOSIS — D649 Anemia, unspecified: Secondary | ICD-10-CM

## 2016-08-23 LAB — CBC WITH DIFFERENTIAL/PLATELET
Basophils Absolute: 0 10*3/uL (ref 0.0–0.1)
Basophils Relative: 0.9 % (ref 0.0–3.0)
Eosinophils Absolute: 0.2 10*3/uL (ref 0.0–0.7)
Eosinophils Relative: 3.3 % (ref 0.0–5.0)
HCT: 30.9 % — ABNORMAL LOW (ref 36.0–46.0)
Hemoglobin: 9.6 g/dL — ABNORMAL LOW (ref 12.0–15.0)
Lymphocytes Relative: 36.6 % (ref 12.0–46.0)
Lymphs Abs: 1.9 10*3/uL (ref 0.7–4.0)
MCHC: 31 g/dL (ref 30.0–36.0)
MCV: 81.9 fl (ref 78.0–100.0)
Monocytes Absolute: 0.4 10*3/uL (ref 0.1–1.0)
Monocytes Relative: 7.1 % (ref 3.0–12.0)
Neutro Abs: 2.7 10*3/uL (ref 1.4–7.7)
Neutrophils Relative %: 52.1 % (ref 43.0–77.0)
Platelets: 223 10*3/uL (ref 150.0–400.0)
RBC: 3.77 Mil/uL — ABNORMAL LOW (ref 3.87–5.11)
RDW: 16.8 % — ABNORMAL HIGH (ref 11.5–15.5)
WBC: 5.2 10*3/uL (ref 4.0–10.5)

## 2016-08-23 LAB — HEMOGLOBIN A1C: Hgb A1c MFr Bld: 5.8 % (ref 4.6–6.5)

## 2016-08-23 LAB — FERRITIN: Ferritin: 6.7 ng/mL — ABNORMAL LOW (ref 10.0–291.0)

## 2016-08-24 LAB — GLUCOSE, FASTING: Glucose, Fasting: 95 mg/dL (ref 65–99)

## 2016-08-25 ENCOUNTER — Other Ambulatory Visit: Payer: Self-pay | Admitting: Internal Medicine

## 2016-08-25 DIAGNOSIS — D649 Anemia, unspecified: Secondary | ICD-10-CM

## 2016-08-25 NOTE — Progress Notes (Signed)
Order placed for f/u labs.  

## 2016-08-26 ENCOUNTER — Other Ambulatory Visit: Payer: Self-pay | Admitting: Internal Medicine

## 2016-08-26 DIAGNOSIS — D649 Anemia, unspecified: Secondary | ICD-10-CM

## 2016-08-26 NOTE — Progress Notes (Signed)
Order placed for GI referral.   

## 2016-08-27 ENCOUNTER — Ambulatory Visit (INDEPENDENT_AMBULATORY_CARE_PROVIDER_SITE_OTHER): Payer: 59

## 2016-08-27 DIAGNOSIS — E538 Deficiency of other specified B group vitamins: Secondary | ICD-10-CM | POA: Diagnosis not present

## 2016-08-27 MED ORDER — CYANOCOBALAMIN 1000 MCG/ML IJ SOLN
1000.0000 ug | Freq: Once | INTRAMUSCULAR | Status: AC
  Administered 2016-08-27: 1000 ug via INTRAMUSCULAR

## 2016-08-27 NOTE — Progress Notes (Addendum)
Patient came in for last weekly injection.  Received in Right deltoid.  Patient tolerated well.   Reviewed.  Dr Nicki Reaper

## 2016-08-27 NOTE — Addendum Note (Signed)
Addended by: Bevelyn Ngo on: 08/27/2016 09:07 AM   Modules accepted: Orders

## 2016-10-07 ENCOUNTER — Other Ambulatory Visit (INDEPENDENT_AMBULATORY_CARE_PROVIDER_SITE_OTHER): Payer: 59

## 2016-10-07 DIAGNOSIS — D649 Anemia, unspecified: Secondary | ICD-10-CM

## 2016-10-07 LAB — CBC WITH DIFFERENTIAL/PLATELET
Basophils Absolute: 0 10*3/uL (ref 0.0–0.1)
Basophils Relative: 0.6 % (ref 0.0–3.0)
Eosinophils Absolute: 0.1 10*3/uL (ref 0.0–0.7)
Eosinophils Relative: 2.9 % (ref 0.0–5.0)
HCT: 32.6 % — ABNORMAL LOW (ref 36.0–46.0)
Hemoglobin: 10.3 g/dL — ABNORMAL LOW (ref 12.0–15.0)
Lymphocytes Relative: 38.8 % (ref 12.0–46.0)
Lymphs Abs: 1.9 10*3/uL (ref 0.7–4.0)
MCHC: 31.5 g/dL (ref 30.0–36.0)
MCV: 81.4 fl (ref 78.0–100.0)
Monocytes Absolute: 0.2 10*3/uL (ref 0.1–1.0)
Monocytes Relative: 5.2 % (ref 3.0–12.0)
Neutro Abs: 2.5 10*3/uL (ref 1.4–7.7)
Neutrophils Relative %: 52.5 % (ref 43.0–77.0)
Platelets: 210 10*3/uL (ref 150.0–400.0)
RBC: 4.01 Mil/uL (ref 3.87–5.11)
RDW: 16.8 % — ABNORMAL HIGH (ref 11.5–15.5)
WBC: 4.8 10*3/uL (ref 4.0–10.5)

## 2016-10-07 LAB — FERRITIN: Ferritin: 7.4 ng/mL — ABNORMAL LOW (ref 10.0–291.0)

## 2016-10-08 ENCOUNTER — Other Ambulatory Visit: Payer: Self-pay | Admitting: Internal Medicine

## 2016-10-08 DIAGNOSIS — E611 Iron deficiency: Secondary | ICD-10-CM

## 2016-10-08 NOTE — Progress Notes (Signed)
Order placed for hematology referral.  

## 2016-10-30 ENCOUNTER — Inpatient Hospital Stay: Payer: 59 | Admitting: Oncology

## 2016-10-30 ENCOUNTER — Telehealth: Payer: Self-pay | Admitting: Oncology

## 2016-10-30 NOTE — Telephone Encounter (Signed)
No Show appt. Called patient to [facility]schd, per patient will call back on a later date to reschedule. MF

## 2016-10-30 NOTE — Progress Notes (Deleted)
Hematology/Oncology Consult note [facility] Telephone:([phone] [phone]  Patient Care Team: Einar Pheasant, MD as PCP - General (Internal Medicine)   Name of the patient: Kelly Wallace  [id number]  Jul 16, 1950    Reason for referral- iron deficiency anemia   Referring physician- Dr. Einar Pheasant  Date of visit: 10/30/16   History of presenting illness- patient is a 66 year old female with a past medical history significant for hypertension and arthritis and anemia was been referred to [place] for iron deficiency anemia. Recent CBC from 10/07/2016 showed white count of 4.8, H&H of 10.3/32.6 and a platelet count of 210. Ferritin levels were low at 7.4. Over the last 1 year her hemoglobin has been between 9-10 and ferritin has always been less than 10. B12 levels last checked in July 2018 was low at 124. TSH from July 2018 was normal at 1.05. Patient's last colonoscopy was in 2013 which showed nonbleeding hemorrhoids and a repeat colonoscopy in 5 years was recommended  ECOG PS- ***  Pain scale- ***   Review of systems- Review of Systems  Constitutional: Negative for chills, fever, malaise/fatigue and weight loss.  HENT: Negative for congestion, ear discharge and nosebleeds.   Eyes: Negative for blurred vision.  Respiratory: Negative for cough, hemoptysis, sputum production, shortness of breath and wheezing.   Cardiovascular: Negative for chest pain, palpitations, orthopnea and claudication.  Gastrointestinal: Negative for abdominal pain, blood in stool, constipation, diarrhea, heartburn, melena, nausea and vomiting.  Genitourinary: Negative for dysuria, flank pain, frequency, hematuria and urgency.  Musculoskeletal: Negative for back pain, joint pain and myalgias.  Skin: Negative for rash.  Neurological: Negative for dizziness, tingling, focal weakness, seizures, weakness and headaches.  Endo/Heme/Allergies: Does not bruise/bleed easily.    Psychiatric/Behavioral: Negative for depression and suicidal ideas. The patient does not have insomnia.     No Known Allergies  Patient Active Problem List   Diagnosis Date Noted  . Primary localized osteoarthritis of left knee 02/27/2016  . Abnormal CXR 08/31/2015  . Cough 08/03/2015  . Left knee pain 04/03/2015  . Health care maintenance 11/04/2014  . Knee pain, bilateral 02/27/2014  . Iron deficiency 03/01/2013  . Nipple discharge 12/26/2012  . Essential hypertension, benign 04/07/2012  . Elevated alkaline phosphatase level 04/07/2012  . Anemia 04/07/2012  . Personal history of colonic polyps 04/07/2012     Past Medical History:  Diagnosis Date  . Anemia    not currently being treated for this  . Arthritis   . History of colon polyps   . Hypertension      Past Surgical History:  Procedure Laterality Date  . CHOLECYSTECTOMY  1989  . GASTRIC BYPASS  03/21/03  . JOINT REPLACEMENT Right 04/2014   TOTAL KNEE REPLACEMENT, DR. Rudene Christians, [place]  . TOTAL KNEE ARTHROPLASTY Left 02/27/2016   Procedure: TOTAL KNEE ARTHROPLASTY;  Surgeon: Hessie Knows, MD;  Location: [facility] ORS;  Service: Orthopedics;  Laterality: Left;  . TUBAL LIGATION  1976    Social History   Social History  . Marital status: Married    Spouse name: N/A  . Number of children: 2  . Years of education: N/A   Occupational History  .  [employer]   Social History Main Topics  . Smoking status: Never Smoker  . Smokeless tobacco: Never Used  . Alcohol use No  . Drug use: No  . Sexual activity: Yes   Other Topics Concern  . Not on file   Social History Narrative  . No narrative  on file     Family History  Problem Relation Age of Onset  . Hypertension Mother   . Diabetes Mother   . Cancer Maternal Aunt        question of type  . Cancer Maternal Grandmother        colon  . Breast cancer Neg Hx      Current Outpatient Prescriptions:  .  lisinopril (PRINIVIL,ZESTRIL) 10 MG tablet, Take 1 tablet (10 mg  total) by mouth daily., Disp: 90 tablet, Rfl: 0 .  meloxicam (MOBIC) 7.5 MG tablet, Take 1 tablet (7.5 mg total) by mouth daily., Disp: 60 tablet, Rfl: 0   Physical exam: There were no vitals filed for this visit. Physical Exam  Constitutional: She is oriented to person, place, and time and well-developed, well-nourished, and in no distress.  HENT:  Head: Normocephalic and atraumatic.  Eyes: Pupils are equal, round, and reactive to light. EOM are normal.  Neck: Normal range of motion.  Cardiovascular: Normal rate, regular rhythm and normal heart sounds.   Pulmonary/Chest: Effort normal and breath sounds normal.  Abdominal: Soft. Bowel sounds are normal.  Neurological: She is alert and oriented to person, place, and time.  Skin: Skin is warm and dry.       CMP Latest Ref Rng & Units 07/23/2016  Glucose 70 - 99 mg/dL 104(H)  BUN 6 - 23 mg/dL 12  Creatinine 0.40 - 1.20 mg/dL 0.62  Sodium 135 - 145 mEq/L 139  Potassium 3.5 - 5.1 mEq/L 3.7  Chloride 96 - 112 mEq/L 108  CO2 19 - 32 mEq/L 24  Calcium 8.4 - 10.5 mg/dL 8.9  Total Protein 6.0 - 8.3 g/dL 7.3  Total Bilirubin 0.2 - 1.2 mg/dL 0.5  Alkaline Phos 39 - 117 U/L 169(H)  AST 0 - 37 U/L 15  ALT 0 - 35 U/L 9   CBC Latest Ref Rng & Units 10/07/2016  WBC 4.0 - 10.5 K/uL 4.8  Hemoglobin 12.0 - 15.0 g/dL 10.3(L)  Hematocrit 36.0 - 46.0 % 32.6(L)  Platelets 150.0 - 400.0 K/uL 210.0     Assessment and plan- Patient is a 66 y.o. female referred for iron deficiency anemia  Iron deficiency anemia-   will repeat cbc with diff, b12, retic count haptoglobin and iron studies. Will also check stool H pylori antigen, celiac panel, UA today. I will refer her to GI to complete her workup for iron deficiency anemia. She will need EGd and colonoscopy +/- capsule study.     Thank you for this kind referral and the opportunity to participate in the care of this patient   Visit Diagnosis No diagnosis found.  Dr. Randa Evens, MD,  [facility] [place] at [facility] Pager- [phone] 10/30/2016

## 2016-11-06 ENCOUNTER — Ambulatory Visit
Admission: RE | Admit: 2016-11-06 | Discharge: 2016-11-06 | Disposition: A | Discharge status: Home / Self Care | Payer: Self-pay | Source: Ambulatory Visit | Attending: Internal Medicine | Admitting: Internal Medicine

## 2016-11-06 DIAGNOSIS — Z1239 Encounter for other screening for malignant neoplasm of breast: Secondary | ICD-10-CM

## 2017-02-24 ENCOUNTER — Telehealth: Payer: Self-pay

## 2017-02-24 NOTE — Telephone Encounter (Signed)
Copied from [place] [id number]. Topic: Quick Communication - See Telephone Encounter >> Feb 24, 2017  3:29 PM Oneta Rack wrote: CRM for notification. See Telephone encounter for:   02/24/17.  Relation to pt: self  Call back number: [phone]    Reason for call:  Patient experincing leg numbness for several weeks patient states she recently went back to work and she has to massage her legs to keep the circulation moving. Patient would like to see her PCP but physican schedule 100 percent book, please advise if patient can be worked in. Patient works for 7p to [place] and would prefer in the morning, please advise   >> Feb 24, 2017  3:34 PM Oneta Rack wrote: CRM for notification. See Telephone encounter for:   02/24/17.  Relation to pt: self  Call back number: [phone]    Reason for call:  Patient experincing leg numbness for several weeks patient states she recently went back to work and she has to massage her legs to keep the circulation moving. Patient would like to see her PCP but physican schedule 100 percent book, please advise if patient can be worked in. Patient works for 7p to [place] and would prefer in the morning, please advise

## 2017-02-25 NOTE — Telephone Encounter (Signed)
Pt scheduled  

## 2017-02-25 NOTE — Telephone Encounter (Signed)
Please schedule

## 2017-02-26 ENCOUNTER — Encounter: Payer: Self-pay | Admitting: Internal Medicine

## 2017-02-26 ENCOUNTER — Ambulatory Visit: Payer: 59 | Admitting: Internal Medicine

## 2017-02-26 VITALS — BP 141/89 | HR 81 | Temp 98.1°F | Resp 18 | Wt 231.4 lb

## 2017-02-26 DIAGNOSIS — E611 Iron deficiency: Secondary | ICD-10-CM | POA: Diagnosis not present

## 2017-02-26 DIAGNOSIS — E538 Deficiency of other specified B group vitamins: Secondary | ICD-10-CM

## 2017-02-26 DIAGNOSIS — M25562 Pain in left knee: Secondary | ICD-10-CM

## 2017-02-26 DIAGNOSIS — R748 Abnormal levels of other serum enzymes: Secondary | ICD-10-CM | POA: Diagnosis not present

## 2017-02-26 DIAGNOSIS — G8929 Other chronic pain: Secondary | ICD-10-CM | POA: Diagnosis not present

## 2017-02-26 DIAGNOSIS — R200 Anesthesia of skin: Secondary | ICD-10-CM | POA: Diagnosis not present

## 2017-02-26 DIAGNOSIS — I10 Essential (primary) hypertension: Secondary | ICD-10-CM

## 2017-02-26 DIAGNOSIS — M25561 Pain in right knee: Secondary | ICD-10-CM

## 2017-02-26 DIAGNOSIS — D649 Anemia, unspecified: Secondary | ICD-10-CM

## 2017-02-26 LAB — CBC WITH DIFFERENTIAL/PLATELET
Basophils Absolute: 0 10*3/uL (ref 0.0–0.1)
Basophils Relative: 0.9 % (ref 0.0–3.0)
Eosinophils Absolute: 0.1 10*3/uL (ref 0.0–0.7)
Eosinophils Relative: 2.5 % (ref 0.0–5.0)
HCT: 30.4 % — ABNORMAL LOW (ref 36.0–46.0)
Hemoglobin: 9.5 g/dL — ABNORMAL LOW (ref 12.0–15.0)
Lymphocytes Relative: 34.1 % (ref 12.0–46.0)
Lymphs Abs: 1.7 10*3/uL (ref 0.7–4.0)
MCHC: 31.4 g/dL (ref 30.0–36.0)
MCV: 78.7 fl (ref 78.0–100.0)
Monocytes Absolute: 0.3 10*3/uL (ref 0.1–1.0)
Monocytes Relative: 6.1 % (ref 3.0–12.0)
Neutro Abs: 2.8 10*3/uL (ref 1.4–7.7)
Neutrophils Relative %: 56.4 % (ref 43.0–77.0)
Platelets: 232 10*3/uL (ref 150.0–400.0)
RBC: 3.86 Mil/uL — ABNORMAL LOW (ref 3.87–5.11)
RDW: 17.6 % — ABNORMAL HIGH (ref 11.5–15.5)
WBC: 5 10*3/uL (ref 4.0–10.5)

## 2017-02-26 LAB — BASIC METABOLIC PANEL
BUN: 9 mg/dL (ref 6–23)
CO2: 27 mEq/L (ref 19–32)
Calcium: 8.1 mg/dL — ABNORMAL LOW (ref 8.4–10.5)
Chloride: 108 mEq/L (ref 96–112)
Creatinine, Ser: 0.59 mg/dL (ref 0.40–1.20)
GFR: 131.66 mL/min (ref >60.00)
Glucose, Bld: 103 mg/dL — ABNORMAL HIGH (ref 70–99)
Potassium: 4.1 mEq/L (ref 3.5–5.1)
Sodium: 141 mEq/L (ref 135–145)

## 2017-02-26 LAB — HEPATIC FUNCTION PANEL
ALT: 15 U/L (ref 0–35)
AST: 18 U/L (ref 0–37)
Albumin: 3.7 g/dL (ref 3.5–5.2)
Alkaline Phosphatase: 179 U/L — ABNORMAL HIGH (ref 39–117)
Bilirubin, Direct: 0.1 mg/dL (ref 0.0–0.3)
Total Bilirubin: 0.6 mg/dL (ref 0.2–1.2)
Total Protein: 6.9 g/dL (ref 6.0–8.3)

## 2017-02-26 LAB — TSH: TSH: 0.42 u[IU]/mL (ref 0.35–4.50)

## 2017-02-26 LAB — VITAMIN B12: Vitamin B-12: 204 pg/mL — ABNORMAL LOW (ref 211–911)

## 2017-02-26 LAB — HEMOGLOBIN A1C: Hgb A1c MFr Bld: 5.9 % (ref 4.6–6.5)

## 2017-02-26 LAB — FERRITIN: Ferritin: 4.9 ng/mL — ABNORMAL LOW (ref 10.0–291.0)

## 2017-02-26 MED ORDER — CYANOCOBALAMIN 1000 MCG/ML IJ SOLN
1000.0000 ug | Freq: Once | INTRAMUSCULAR | Status: AC
  Administered 2017-02-26: 1000 ug via INTRAMUSCULAR

## 2017-02-26 MED ORDER — LISINOPRIL 10 MG PO TABS: 10.0000 mg | ORAL_TABLET | Freq: Every day | ORAL | 0 refills | Status: DC

## 2017-02-26 NOTE — Progress Notes (Signed)
[name], female   DOB: 1950-10-02, 67 y.o.   MRN: [id number]   Subjective:    [name], female    DOB: 1950-07-19, 67 y.o.   MRN: [id number]  HPI  Patient here as a work in with concerns regarding bilateral leg numbness.  She reports she is doing relatively well.  Has noticed bilateral leg numbness.  Right > left.  Occurs from knee down.  Does not feel in her feet.  Worse when she sits a long time.  Heavy legs.  No pain.  No chest pain.  Breathing stable.  No acid reflux.  No abdominal pain.  Bowels moving.  Not taking her blood pressure medication.  Out of medication.  Has not been getting her B12 injections.     Past Medical History:  Diagnosis Date  . Anemia    not currently being treated for this  . Arthritis   . History of colon polyps   . Hypertension    Past Surgical History:  Procedure Laterality Date  . CHOLECYSTECTOMY  1989  . GASTRIC BYPASS  03/21/03  . JOINT REPLACEMENT Right 04/2014   TOTAL KNEE REPLACEMENT, DR. Rudene Christians, [place]  . TOTAL KNEE ARTHROPLASTY Left 02/27/2016   Procedure: TOTAL KNEE ARTHROPLASTY;  Surgeon: Hessie Knows, MD;  Location: [facility] ORS;  Service: Orthopedics;  Laterality: Left;  . TUBAL LIGATION  1976   Family History  Problem Relation Age of Onset  . Hypertension Mother   . Diabetes Mother   . Cancer Maternal Aunt        question of type  . Cancer Maternal Grandmother        colon  . Breast cancer Neg Hx    Social History   Socioeconomic History  . Marital status: Married    Spouse name: None  . Number of children: 2  . Years of education: None  . Highest education level: None  Social Needs  . Financial resource strain: None  . Food insecurity - worry: None  . Food insecurity - inability: None  . Transportation needs - medical: None  . Transportation needs - non-medical: None  Occupational History    Employer: [employer]  Tobacco Use  . Smoking status: Never Smoker  . Smokeless tobacco: Never Used  Substance  and Sexual Activity  . Alcohol use: No    Alcohol/week: 0.0 oz  . Drug use: No  . Sexual activity: Yes  Other Topics Concern  . None  Social History Narrative  . None    Outpatient Encounter Medications as of 02/26/2017  Medication Sig  . lisinopril (PRINIVIL,ZESTRIL) 10 MG tablet Take 1 tablet (10 mg total) by mouth daily. (Patient not taking: Reported on 02/26/2017)  . meloxicam (MOBIC) 7.5 MG tablet Take 1 tablet (7.5 mg total) by mouth daily. (Patient not taking: Reported on 02/26/2017)   No facility-administered encounter medications on file as of 02/26/2017.     Review of Systems  Constitutional: Negative for appetite change and unexpected weight change.  HENT: Negative for congestion and sinus pressure.   Respiratory: Negative for cough, chest tightness and shortness of breath.   Cardiovascular: Negative for chest pain, palpitations and leg swelling.  Gastrointestinal: Negative for abdominal pain, diarrhea, nausea and vomiting.  Genitourinary: Negative for difficulty urinating and dysuria.  Musculoskeletal: Negative for myalgias.       Leg numbness as outlined.  No pain.   Skin: Negative for color change and rash.  Neurological: Negative for dizziness, light-headedness and  headaches.  Psychiatric/Behavioral: Negative for agitation and dysphoric mood.       Objective:     Blood pressure rechecked by me:  144/86  Physical Exam  Constitutional: She appears well-developed and well-nourished. No distress.  HENT:  Nose: Nose normal.  Mouth/Throat: Oropharynx is clear and moist.  Neck: Neck supple. No thyromegaly present.  Cardiovascular: Normal rate and regular rhythm.  Pulmonary/Chest: Breath sounds normal. No respiratory distress. She has no wheezes.  Abdominal: Soft. Bowel sounds are normal. There is no tenderness.  Musculoskeletal: She exhibits no edema or tenderness.  Lymphadenopathy:    She has no cervical adenopathy.  Skin: No rash noted. No erythema.  Psychiatric:  She has a normal mood and affect. Her behavior is normal.    BP (!) 141/89 (BP Location: Left Arm, Patient Position: Sitting, Cuff Size: Large)   Pulse 81   Temp 98.1 F (36.7 C) (Oral)   Resp 18   Wt 231 lb 6.4 oz (105 kg)   LMP 04/06/2009   SpO2 96%   BMI 40.99 kg/m  Wt Readings from Last 3 Encounters:  02/26/17 231 lb 6.4 oz (105 kg)  07/22/16 231 lb 3.2 oz (104.9 kg)  04/04/16 229 lb (103.9 kg)     Lab Results  Component Value Date   WBC 4.8 10/07/2016   HGB 10.3 (L) 10/07/2016   HCT 32.6 (L) 10/07/2016   PLT 210.0 10/07/2016   GLUCOSE 104 (H) 07/23/2016   CHOL 104 07/23/2016   TRIG 54.0 07/23/2016   HDL 50.40 07/23/2016   LDLCALC 43 07/23/2016   ALT 9 07/23/2016   AST 15 07/23/2016   NA 139 07/23/2016   K 3.7 07/23/2016   CL 108 07/23/2016   CREATININE 0.62 07/23/2016   BUN 12 07/23/2016   CO2 24 07/23/2016   TSH 1.05 07/23/2016   INR 1.14 02/21/2016   HGBA1C 5.8 08/23/2016    Mm Digital Screening Bilateral  Result Date: 11/06/2016 CLINICAL DATA:  Screening. EXAM: DIGITAL SCREENING BILATERAL MAMMOGRAM WITH CAD COMPARISON:  Previous exam(s). ACR Breast Density Category a: The breast tissue is almost entirely fatty. FINDINGS: There are no findings suspicious for malignancy. Images were processed with CAD. IMPRESSION: No mammographic evidence of malignancy. A result letter of this screening mammogram will be mailed directly to the patient. RECOMMENDATION: Screening mammogram in one year. (Code:SM-B-01Y) BI-RADS CATEGORY  1: Negative. Electronically Signed   By: Franki Cabot M.D.   On: 11/06/2016 13:57       Assessment & Plan:   Problem List Items Addressed This Visit    None       Einar Pheasant, MD

## 2017-03-01 ENCOUNTER — Encounter: Payer: Self-pay | Admitting: Internal Medicine

## 2017-03-01 NOTE — Assessment & Plan Note (Signed)
Have discussed with hematology.  Felt since stable.  Follow.  Recheck liver panel.

## 2017-03-01 NOTE — Assessment & Plan Note (Signed)
Blood pressure elevated.  Not taking her medication.  Restart.  Check metabolic panel.

## 2017-03-01 NOTE — Assessment & Plan Note (Signed)
Not taking iron supplements.  Recheck cbc and ferritin.

## 2017-03-01 NOTE — Assessment & Plan Note (Signed)
Bilateral leg numbness as outlined.  Does not involve her feet.  Is B12 deficient. Has not followed up to get her injections.  Recheck B12 level.  Injection today and scheduled for f/u injections.  No weakness noted on exam.  Check cbc and ferritin.  Obtain NCS.

## 2017-03-01 NOTE — Assessment & Plan Note (Signed)
Not receiving her b12 injections.  Injection today.  Recheck cbc, ferritin and B12.

## 2017-03-02 ENCOUNTER — Other Ambulatory Visit: Payer: Self-pay | Admitting: Internal Medicine

## 2017-03-02 DIAGNOSIS — R748 Abnormal levels of other serum enzymes: Secondary | ICD-10-CM

## 2017-03-02 DIAGNOSIS — D649 Anemia, unspecified: Secondary | ICD-10-CM

## 2017-03-02 DIAGNOSIS — E611 Iron deficiency: Secondary | ICD-10-CM

## 2017-03-02 NOTE — Progress Notes (Signed)
Order placed for hematology referral.  

## 2017-03-06 ENCOUNTER — Ambulatory Visit (INDEPENDENT_AMBULATORY_CARE_PROVIDER_SITE_OTHER): Payer: 59 | Admitting: *Deleted

## 2017-03-06 DIAGNOSIS — E538 Deficiency of other specified B group vitamins: Secondary | ICD-10-CM | POA: Diagnosis not present

## 2017-03-06 MED ORDER — CYANOCOBALAMIN 1000 MCG/ML IJ SOLN
1000.0000 ug | Freq: Once | INTRAMUSCULAR | Status: AC
  Administered 2017-03-06: 1000 ug via INTRAMUSCULAR

## 2017-03-06 NOTE — Progress Notes (Addendum)
Patient presented for B 12 injection to left deltoid, patient voiced no concerns nor showed any signs of distress during injection.  Reviewed.  Dr Scott 

## 2017-03-11 ENCOUNTER — Inpatient Hospital Stay: Payer: 59 | Attending: Oncology | Admitting: Oncology

## 2017-03-11 ENCOUNTER — Encounter: Payer: Self-pay | Admitting: Oncology

## 2017-03-11 ENCOUNTER — Other Ambulatory Visit: Payer: Self-pay

## 2017-03-11 VITALS — BP 148/88 | HR 69 | Temp 97.1°F | Resp 18 | Ht 63.0 in | Wt 233.0 lb

## 2017-03-11 DIAGNOSIS — Z9884 Bariatric surgery status: Secondary | ICD-10-CM

## 2017-03-11 DIAGNOSIS — D509 Iron deficiency anemia, unspecified: Secondary | ICD-10-CM | POA: Diagnosis not present

## 2017-03-11 DIAGNOSIS — E538 Deficiency of other specified B group vitamins: Secondary | ICD-10-CM | POA: Insufficient documentation

## 2017-03-11 NOTE — Progress Notes (Signed)
Hematology/Oncology Consult note [facility] [phone] [phone]  Patient Care Team: Einar Pheasant, MD as PCP - General (Internal Medicine)   Name of the patient: Kelly Wallace  [id number]  03/24/1950    Reason for referral- anemia   Referring physician- Dr. Einar Pheasant  Date of visit: 03/11/17   History of presenting illness-patient is a 67 year old obese African-American female  who has been referred to [place] for evaluation and management of anemia.  She has a history of gastric bypass surgery back in 2005.  She is not currently taking any iron supplements.  Reports possible history of colon cancer in her maternal grandmother but she is not sure.  She denies any bleeding in her stool or urine.  Denies any gum bleeds and nosebleeds.  She uses Aleve occasionally maybe once in a few months for headaches but not consistently.  Denies any dark melanotic stools.  Denies any loss of appetite or unintentional weight loss.  She is postmenopausal   Recent CBC from 02/26/2017 showed white count of 5, H&H of 9.5/30.4 with an MCV of 78.7 and a platelet count of 232.  Ferritin levels were low at 4.9.  Of note ferritin was low at 7.45 months ago as well.  B12 levels were low at 204.  TSH was normal.  On review of her prior CBCs her hemoglobin has been between 9-10 for the last 1 year.  Borderline low MCV levels.  No other cytopenias.  Last colonoscopy in June 2013 showed internal hemorrhoids but no other abnormal findings. She has not seen GI.    ECOG PS- 1  Pain scale- 0   Review of systems- Review of Systems  Constitutional: Positive for malaise/fatigue. Negative for chills, fever and weight loss.  HENT: Negative for congestion, ear discharge and nosebleeds.   Eyes: Negative for blurred vision.  Respiratory: Negative for cough, hemoptysis, sputum production, shortness of breath and wheezing.   Cardiovascular: Negative for chest pain, palpitations,  orthopnea and claudication.  Gastrointestinal: Negative for abdominal pain, blood in stool, constipation, diarrhea, heartburn, melena, nausea and vomiting.  Genitourinary: Negative for dysuria, flank pain, frequency, hematuria and urgency.  Musculoskeletal: Negative for back pain, joint pain and myalgias.  Skin: Negative for rash.  Neurological: Negative for dizziness, tingling, focal weakness, seizures, weakness and headaches.  Endo/Heme/Allergies: Does not bruise/bleed easily.  Psychiatric/Behavioral: Negative for depression and suicidal ideas. The patient does not have insomnia.     No Known Allergies  Patient Active Problem List   Diagnosis Date Noted  . Bilateral leg numbness 02/26/2017  . Primary localized osteoarthritis of left knee 02/27/2016  . Left knee pain 04/03/2015  . Health care maintenance 11/04/2014  . Knee pain, bilateral 02/27/2014  . Iron deficiency 03/01/2013  . Nipple discharge 12/26/2012  . Essential hypertension, benign 04/07/2012  . Elevated alkaline phosphatase level 04/07/2012  . Anemia 04/07/2012  . Personal history of colonic polyps 04/07/2012     Past Medical History:  Diagnosis Date  . Anemia    not currently being treated for this  . Arthritis   . History of colon polyps   . Hypertension      Past Surgical History:  Procedure Laterality Date  . CHOLECYSTECTOMY  1989  . GASTRIC BYPASS  03/21/03  . JOINT REPLACEMENT Right 04/2014   TOTAL KNEE REPLACEMENT, DR. Rudene Christians, [place]  . TOTAL KNEE ARTHROPLASTY Left 02/27/2016   Procedure: TOTAL KNEE ARTHROPLASTY;  Surgeon: Hessie Knows, MD;  Location: [facility] ORS;  Service: Orthopedics;  Laterality: Left;  . TUBAL LIGATION  1976    Social History   Socioeconomic History  . Marital status: Married    Spouse name: Not on file  . Number of children: 2  . Years of education: Not on file  . Highest education level: Not on file  Social Needs  . Financial resource strain: Not on file  . Food insecurity -  worry: Not on file  . Food insecurity - inability: Not on file  . Transportation needs - medical: Not on file  . Transportation needs - non-medical: Not on file  Occupational History    Employer: [employer]  Tobacco Use  . Smoking status: Never Smoker  . Smokeless tobacco: Never Used  Substance and Sexual Activity  . Alcohol use: No    Alcohol/week: 0.0 oz  . Drug use: No  . Sexual activity: Yes  Other Topics Concern  . Not on file  Social History Narrative  . Not on file     Family History  Problem Relation Age of Onset  . Hypertension Mother   . Diabetes Mother   . Cancer Maternal Aunt        question of type  . Cancer Maternal Grandmother        colon  . Breast cancer Neg Hx      Current Outpatient Medications:  .  Cyanocobalamin (VITAMIN B-12 IJ), Inject as directed., Disp: , Rfl:  .  lisinopril (PRINIVIL,ZESTRIL) 10 MG tablet, Take 1 tablet (10 mg total) by mouth daily., Disp: 90 tablet, Rfl: 0   Physical exam:  Vitals:   03/11/17 1110  BP: (!) 148/88  Pulse: 69  Resp: 18  Temp: (!) 97.1 F (36.2 C)  TempSrc: Tympanic  SpO2: 99%  Weight: 233 lb (105.7 kg)  Height: 5\' 3"  (1.6 m)   Physical Exam  Constitutional: She is oriented to person, place, and time.  Patient is obese and does not appear to be in any acute distress  HENT:  Head: Normocephalic and atraumatic.  Eyes: EOM are normal. Pupils are equal, round, and reactive to light.  Neck: Normal range of motion.  Cardiovascular: Normal rate, regular rhythm and normal heart sounds.  Pulmonary/Chest: Effort normal and breath sounds normal.  Abdominal: Soft. Bowel sounds are normal.  Neurological: She is alert and oriented to person, place, and time.  Skin: Skin is warm and dry.       CMP Latest Ref Rng & Units 02/26/2017  Glucose 70 - 99 mg/dL 103(H)  BUN 6 - 23 mg/dL 9  Creatinine 0.40 - 1.20 mg/dL 0.59  Sodium 135 - 145 mEq/L 141  Potassium 3.5 - 5.1 mEq/L 4.1  Chloride 96 - 112 mEq/L 108  CO2 19 -  32 mEq/L 27  Calcium 8.4 - 10.5 mg/dL 8.1(L)  Total Protein 6.0 - 8.3 g/dL 6.9  Total Bilirubin 0.2 - 1.2 mg/dL 0.6  Alkaline Phos 39 - 117 U/L 179(H)  AST 0 - 37 U/L 18  ALT 0 - 35 U/L 15   CBC Latest Ref Rng & Units 02/26/2017  WBC 4.0 - 10.5 K/uL 5.0  Hemoglobin 12.0 - 15.0 g/dL 9.5(L)  Hematocrit 36.0 - 46.0 % 30.4(L)  Platelets 150.0 - 400.0 K/uL 232.0   Assessment and plan- Patient is a 67 y.o. female referred to [place] for iron deficiency anemia  I have reviewed recent blood work which was consistent with iron deficiency anemia.  Patient also noted to have B12 deficiency and is currently getting B12 shots  through Dr. Nicki Reaper.  With regards to etiology of iron deficiency anemia: It is possibly secondary to her gastric bypass surgery.  However patient still needs complete GI evaluation including EGD and colonoscopy plus minus capsule endoscopy.  Her last colonoscopy was in 2013 which showed internal hemorrhoids but no other acute findings.  I will check for urinalysis, stool H. pylori antigen at this time  I will proceed with 2 doses of Feraheme 510 mg IV weekly.  Discussed risks and benefits of Feraheme including all but not limited to headache, leg swelling and possible risk of infusion reaction.  Patient understands and agrees to proceed as planned.  Plan to repeat CBC ferritin and iron studies as well as celiac disease panel, B12 and folate in 2 months time and I will see her after that   Thank you for this kind referral and the opportunity to participate in the care of this patient   Visit Diagnosis 1. Iron deficiency anemia, unspecified iron deficiency anemia type   2. B12 deficiency   3. History of gastric bypass     Dr. Randa Evens, MD, MPH [facility] at [facility] Pager- [phone] 03/11/2017 1:47 PM

## 2017-03-13 ENCOUNTER — Inpatient Hospital Stay: Payer: 59

## 2017-03-13 ENCOUNTER — Ambulatory Visit: Payer: 59

## 2017-03-13 VITALS — BP 110/73 | HR 85 | Temp 97.3°F | Resp 18

## 2017-03-13 DIAGNOSIS — D509 Iron deficiency anemia, unspecified: Secondary | ICD-10-CM | POA: Diagnosis not present

## 2017-03-13 DIAGNOSIS — E611 Iron deficiency: Secondary | ICD-10-CM

## 2017-03-13 DIAGNOSIS — E538 Deficiency of other specified B group vitamins: Secondary | ICD-10-CM | POA: Diagnosis not present

## 2017-03-13 MED ORDER — SODIUM CHLORIDE 0.9 % IV SOLN
Freq: Once | INTRAVENOUS | Status: AC
Start: 2017-03-13 — End: 2017-03-13
  Administered 2017-03-13: 14:00:00 via INTRAVENOUS
  Filled 2017-03-13: qty 1000

## 2017-03-13 MED ORDER — SODIUM CHLORIDE 0.9 % IV SOLN
510.0000 mg | Freq: Once | INTRAVENOUS | Status: AC
  Administered 2017-03-13: 510 mg via INTRAVENOUS
  Filled 2017-03-13: qty 17

## 2017-03-18 ENCOUNTER — Telehealth: Payer: Self-pay

## 2017-03-18 NOTE — Telephone Encounter (Signed)
Patient was contacted about GI referral with [facility]. The patient has verbalize understanding and agreed to attend the appointment. cbg

## 2017-03-20 ENCOUNTER — Inpatient Hospital Stay: Payer: 59

## 2017-03-20 ENCOUNTER — Ambulatory Visit (INDEPENDENT_AMBULATORY_CARE_PROVIDER_SITE_OTHER): Payer: 59

## 2017-03-20 VITALS — BP 120/84 | HR 76 | Temp 97.7°F | Resp 18

## 2017-03-20 DIAGNOSIS — E538 Deficiency of other specified B group vitamins: Secondary | ICD-10-CM

## 2017-03-20 DIAGNOSIS — E611 Iron deficiency: Secondary | ICD-10-CM

## 2017-03-20 DIAGNOSIS — D509 Iron deficiency anemia, unspecified: Secondary | ICD-10-CM | POA: Diagnosis not present

## 2017-03-20 MED ORDER — SODIUM CHLORIDE 0.9 % IV SOLN
Freq: Once | INTRAVENOUS | Status: AC
  Administered 2017-03-20: 14:00:00 via INTRAVENOUS
  Filled 2017-03-20: qty 1000

## 2017-03-20 MED ORDER — CYANOCOBALAMIN 1000 MCG/ML IJ SOLN
1000.0000 ug | Freq: Once | INTRAMUSCULAR | Status: AC
Start: 2017-03-20 — End: 2017-03-20
  Administered 2017-03-20: 1000 ug via INTRAMUSCULAR

## 2017-03-20 MED ORDER — SODIUM CHLORIDE 0.9 % IV SOLN
510.0000 mg | Freq: Once | INTRAVENOUS | Status: AC
  Administered 2017-03-20: 510 mg via INTRAVENOUS
  Filled 2017-03-20: qty 17

## 2017-03-20 NOTE — Progress Notes (Addendum)
Patient presents today for B12 injection. Right deltoid. Patient voiced no concerns nor showed any signs of distress during injection.   Reviewed.  Dr Nicki Reaper

## 2017-03-20 NOTE — Progress Notes (Signed)
Pt declines to stay 30 minute post observation period. Pt tolerated infusion well. Pt and VS stable at discharge.

## 2017-03-24 ENCOUNTER — Emergency Department: Payer: 59

## 2017-03-24 ENCOUNTER — Other Ambulatory Visit: Payer: Self-pay

## 2017-03-24 ENCOUNTER — Emergency Department
Admission: EM | Admit: 2017-03-24 | Discharge: 2017-03-24 | Disposition: A | Discharge status: Home / Self Care | Payer: 59 | Attending: Emergency Medicine | Admitting: Emergency Medicine

## 2017-03-24 DIAGNOSIS — Z9884 Bariatric surgery status: Secondary | ICD-10-CM | POA: Diagnosis not present

## 2017-03-24 DIAGNOSIS — R1110 Vomiting, unspecified: Secondary | ICD-10-CM | POA: Diagnosis present

## 2017-03-24 DIAGNOSIS — I10 Essential (primary) hypertension: Secondary | ICD-10-CM | POA: Insufficient documentation

## 2017-03-24 DIAGNOSIS — Z79899 Other long term (current) drug therapy: Secondary | ICD-10-CM | POA: Diagnosis not present

## 2017-03-24 DIAGNOSIS — R1084 Generalized abdominal pain: Secondary | ICD-10-CM | POA: Diagnosis not present

## 2017-03-24 DIAGNOSIS — R112 Nausea with vomiting, unspecified: Secondary | ICD-10-CM | POA: Diagnosis not present

## 2017-03-24 DIAGNOSIS — Z9049 Acquired absence of other specified parts of digestive tract: Secondary | ICD-10-CM | POA: Insufficient documentation

## 2017-03-24 DIAGNOSIS — Z96653 Presence of artificial knee joint, bilateral: Secondary | ICD-10-CM | POA: Diagnosis not present

## 2017-03-24 DIAGNOSIS — R0602 Shortness of breath: Secondary | ICD-10-CM | POA: Diagnosis not present

## 2017-03-24 LAB — COMPREHENSIVE METABOLIC PANEL
ALT: 22 U/L (ref 14–54)
AST: 35 U/L (ref 15–41)
Albumin: 4.2 g/dL (ref 3.5–5.0)
Alkaline Phosphatase: 204 U/L — ABNORMAL HIGH (ref 38–126)
Anion gap: 11 (ref 5–15)
BUN: 15 mg/dL (ref 6–20)
CO2: 18 mmol/L — ABNORMAL LOW (ref 22–32)
Calcium: 8.7 mg/dL — ABNORMAL LOW (ref 8.9–10.3)
Chloride: 110 mmol/L (ref 101–111)
Creatinine, Ser: 0.73 mg/dL (ref 0.44–1.00)
GFR calc Af Amer: >60 mL/min (ref >60)
GFR calc non Af Amer: >60 mL/min (ref >60)
Glucose, Bld: 175 mg/dL — ABNORMAL HIGH (ref 65–99)
Potassium: 4.1 mmol/L (ref 3.5–5.1)
Sodium: 139 mmol/L (ref 135–145)
Total Bilirubin: 0.6 mg/dL (ref 0.3–1.2)
Total Protein: 8.3 g/dL — ABNORMAL HIGH (ref 6.5–8.1)

## 2017-03-24 LAB — LIPASE, BLOOD: Lipase: 44 U/L (ref 11–51)

## 2017-03-24 LAB — CBC
HCT: 36.2 % (ref 35.0–47.0)
Hemoglobin: 11.4 g/dL — ABNORMAL LOW (ref 12.0–16.0)
MCH: 25.7 pg — ABNORMAL LOW (ref 26.0–34.0)
MCHC: 31.4 g/dL — ABNORMAL LOW (ref 32.0–36.0)
MCV: 81.9 fL (ref 80.0–100.0)
Platelets: 220 10*3/uL (ref 150–440)
RBC: 4.43 MIL/uL (ref 3.80–5.20)
RDW: 20.6 % — ABNORMAL HIGH (ref 11.5–14.5)
WBC: 11.2 10*3/uL — ABNORMAL HIGH (ref 3.6–11.0)

## 2017-03-24 LAB — TROPONIN I: Troponin I: <0.03 ng/mL (ref <0.03)

## 2017-03-24 MED ORDER — ONDANSETRON 4 MG PO TBDP
ORAL_TABLET | ORAL | Status: AC
  Administered 2017-03-24: 4 mg via ORAL
  Filled 2017-03-24: qty 1

## 2017-03-24 MED ORDER — ONDANSETRON HCL 4 MG/2ML IJ SOLN: 4.0000 mg | INTRAMUSCULAR | Status: DC

## 2017-03-24 MED ORDER — ONDANSETRON 4 MG PO TBDP
4.0000 mg | ORAL_TABLET | Freq: Once | ORAL | Status: AC
  Administered 2017-03-24: 4 mg via ORAL

## 2017-03-24 MED ORDER — ONDANSETRON 4 MG PO TBDP: ORAL_TABLET | ORAL | 0 refills | Status: DC

## 2017-03-24 NOTE — ED Notes (Signed)
ED Provider at bedside. 

## 2017-03-24 NOTE — ED Notes (Signed)
Patient transported to X-ray 

## 2017-03-24 NOTE — ED Notes (Signed)
Patient tolerating po's well.

## 2017-03-24 NOTE — ED Provider Notes (Signed)
[facility] Emergency Department Provider Note  ____________________________________________   First MD Initiated Contact with Patient 03/24/17 [phone]     (approximate)  I have reviewed the triage vital signs and the nursing notes.   HISTORY  Chief Complaint Shortness of Breath; Emesis; and Abdominal Pain    HPI Kelly Wallace is a 67 y.o. female whose medical history is most notable for gastric bypass who presents by EMS for evaluation of acute onset vomiting with some abdominal pain and distention as well as shortness of breath.  The patient states that these episodes happen to her sometimes but they usually do not last very long.  They have been happening since her gastric bypass.  She says it happens if she eats too much or too fast and she thinks she might of done that tonight.  She had a large amount of vomiting prior to me seeing her but by the time I saw her she states that she feels much better.  She denies fever/chills and chest pain.  Whenever she feels like that she feels short of breath that was the case tonight but that sensation has resolved.  She denies any current abdominal pain although she did have some generalized aching abdominal pain earlier.  She describes onset of her symptoms as acute, they were severe, nothing in particular made them better or worse, but she feels better after vomiting.  Denies diarrhea and dysuria.  Past Medical History:  Diagnosis Date  . Anemia    not currently being treated for this  . Arthritis   . History of colon polyps   . Hypertension     Patient Active Problem List   Diagnosis Date Noted  . Bilateral leg numbness 02/26/2017  . Primary localized osteoarthritis of left knee 02/27/2016  . Left knee pain 04/03/2015  . Health care maintenance 11/04/2014  . Knee pain, bilateral 02/27/2014  . Iron deficiency 03/01/2013  . Nipple discharge 12/26/2012  . Essential hypertension, benign 04/07/2012  . Elevated  alkaline phosphatase level 04/07/2012  . Anemia 04/07/2012  . Personal history of colonic polyps 04/07/2012    Past Surgical History:  Procedure Laterality Date  . CHOLECYSTECTOMY  1989  . GASTRIC BYPASS  03/21/03  . JOINT REPLACEMENT Right 04/2014   TOTAL KNEE REPLACEMENT, DR. Rudene Christians, [place] [facility] [place]  . TOTAL KNEE ARTHROPLASTY Left 02/27/2016   Procedure: TOTAL KNEE ARTHROPLASTY;  Surgeon: Hessie Knows, MD;  Location: [facility] ORS;  Service: Orthopedics;  Laterality: Left;  . TUBAL LIGATION  1976    Prior to Admission medications   Medication Sig Start Date End Date Taking? Authorizing Provider  Cyanocobalamin (VITAMIN B-12 IJ) Inject as directed.    [provider]  lisinopril (PRINIVIL,ZESTRIL) 10 MG tablet Take 1 tablet (10 mg total) by mouth daily. 02/26/17   Einar Pheasant, MD  ondansetron (ZOFRAN ODT) 4 MG disintegrating tablet Allow 1-2 tablets to dissolve in your mouth every 8 hours as needed for nausea/vomiting 03/24/17   Hinda Kehr, MD    Allergies Patient has no known allergies.  Family History  Problem Relation Age of Onset  . Hypertension Mother   . Diabetes Mother   . Cancer Maternal Aunt        question of type  . Cancer Maternal Grandmother        colon  . Breast cancer Neg Hx     Social History Social History   Tobacco Use  . Smoking status: Never Smoker  . Smokeless tobacco: Never Used  Substance Use Topics  . Alcohol use: No    Alcohol/week: 0.0 oz  . Drug use: No    Review of Systems Constitutional: No fever/chills Eyes: No visual changes. ENT: No sore throat. Cardiovascular: Denies chest pain. Respiratory: Denies shortness of breath. Gastrointestinal: Abdominal pain and vomiting as described above.  No diarrhea.   Genitourinary: Negative for dysuria. Musculoskeletal: Negative for neck pain.  Negative for back pain. Integumentary: Negative for rash. Neurological: Negative for headaches, focal weakness or  numbness.   ____________________________________________   PHYSICAL EXAM:  VITAL SIGNS: ED Triage Vitals [03/24/17 0335]  Enc Vitals Group     BP (!) 150/84     Pulse Rate 79     Resp (!) 30     Temp (!) 97 F (36.1 C)     Temp Source Oral     SpO2 100 %     Weight 104.3 kg (230 lb)     Height 1.6 m (5\' 3" )     Head Circumference      Peak Flow      Pain Score 5     Pain Loc      Pain Edu?      Excl. in [place]?     Constitutional: Alert and oriented. Well appearing and in no acute distress. Eyes: Conjunctivae are normal.  Head: Atraumatic. Nose: No congestion/rhinnorhea. Mouth/Throat: Mucous membranes are moist. Neck: No stridor.  No meningeal signs.   Cardiovascular: Normal rate, regular rhythm. Good peripheral circulation. Grossly normal heart sounds. Respiratory: Normal respiratory effort.  No retractions. Lungs CTAB. Gastrointestinal: Soft and nontender even to deep palpation.  The patient is obese but I do not appreciate any distention. Musculoskeletal: No lower extremity tenderness nor edema. No gross deformities of extremities. Neurologic:  Normal speech and language. No gross focal neurologic deficits are appreciated.  Skin:  Skin is warm, dry and intact. No rash noted. Psychiatric: Mood and affect are normal. Speech and behavior are normal.  ____________________________________________   LABS (all labs ordered are listed, but only abnormal results are displayed)  Labs Reviewed  COMPREHENSIVE METABOLIC PANEL - Abnormal; Notable for the following components:      Result Value   CO2 18 (*)    Glucose, Bld 175 (*)    Calcium 8.7 (*)    Total Protein 8.3 (*)    Alkaline Phosphatase 204 (*)    All other components within normal limits  CBC - Abnormal; Notable for the following components:   WBC 11.2 (*)    Hemoglobin 11.4 (*)    MCH 25.7 (*)    MCHC 31.4 (*)    RDW 20.6 (*)    All other components within normal limits  LIPASE, BLOOD  TROPONIN I    ____________________________________________  EKG  ED ECG REPORT I, Hinda Kehr, the attending physician, personally viewed and interpreted this ECG.  Date: 03/24/2017 EKG Time: 3:31 AM Rate: 80 Rhythm: normal sinus rhythm QRS Axis: normal Intervals: normal ST/T Wave abnormalities: normal Narrative Interpretation: no evidence of acute ischemia  ____________________________________________  RADIOLOGY I, Hinda Kehr, personally viewed and evaluated these images (plain radiographs) as part of my medical decision making, as well as reviewing the written report by the radiologist.  ED MD interpretation: No acute abnormalities identified on radiographs  Official radiology report(s): Dg Abdomen Acute W/chest  Result Date: 03/24/2017 CLINICAL DATA:  Acute onset of nausea, vomiting and abdominal pain. Shortness of breath. History of gastric bypass. EXAM: DG ABDOMEN ACUTE W/ 1V CHEST COMPARISON:  Chest radiograph 08/31/2015 FINDINGS: Low lung volumes. The cardiomediastinal contours are normal. The lungs are clear. There is no free intra-abdominal air. No dilated bowel loops to suggest obstruction. Postsurgical change in the upper abdomen post gastric bypass and cholecystectomy. Small-moderate volume of stool throughout the colon. No radiopaque calculi. No acute osseous abnormalities are seen. IMPRESSION: 1. Post gastric bypass without evidence of obstruction or free air. 2. Low lung volumes without acute abnormality. Electronically Signed   By: Jeb Levering M.D.   On: 03/24/2017 06:19    ____________________________________________   PROCEDURES  Critical Care performed: No   Procedure(s) performed:   Procedures   ____________________________________________   INITIAL IMPRESSION / ASSESSMENT AND PLAN / ED COURSE  As part of my medical decision making, I reviewed the following data within the [place] notes reviewed and incorporated, EKG  interpreted , Old chart reviewed and Radiograph reviewed     Differential diagnosis includes, but is not limited to, biliary disease (biliary colic, acute cholecystitis, cholangitis, choledocholithiasis, etc), intrathoracic causes for epigastric abdominal pain including ACS, gastritis, duodenitis, pancreatitis, small bowel or large bowel obstruction, abdominal aortic aneurysm, hernia, and gastritis.  However the patient is very well-appearing and in no distress.  Even though she has some abnormal vitals initially after her nausea and abdominal pain subsided she says that she feels completely well.  She has been resting comfortably and in no acute distress.  She has no tenderness even to deep palpation.  Her lab results are reassuring with no acute abnormalities including CMP, lipase, troponin, and CBC.  I obtained acute abdomen series radiographs with no evidence of any obstruction or infiltrate on her chest x-ray.  She was able to tolerate saltines and liquids without any difficulty and she is comfortable with the plan to go home.  I suspect she either ate something that did not agree with her or perhaps ate and drank too much for her post gastric bypass state.  Regardless there is no evidence of acute or emergent medical condition at this time and I will discharge her for outpatient follow-up.  I gave my usual and customary return precautions.      ____________________________________________  FINAL CLINICAL IMPRESSION(S) / ED DIAGNOSES  Final diagnoses:  Non-intractable vomiting with nausea, unspecified vomiting type  Generalized abdominal pain     MEDICATIONS GIVEN DURING THIS VISIT:  Medications  ondansetron (ZOFRAN) injection 4 mg (not administered)  ondansetron (ZOFRAN-ODT) disintegrating tablet 4 mg (4 mg Oral Given 03/24/17 0401)     ED Discharge Orders        Ordered    ondansetron (ZOFRAN ODT) 4 MG disintegrating tablet     03/24/17 1610       Note:  This document was  prepared using Dragon voice recognition software and may include unintentional dictation errors.    Hinda Kehr, MD 03/24/17 [phone]

## 2017-03-24 NOTE — ED Notes (Signed)
Report to dawn, rn.  

## 2017-03-24 NOTE — Discharge Instructions (Signed)

## 2017-03-24 NOTE — ED Notes (Signed)
Patient reports she felt fine until after she ate tonight, states felt like it got "stuck" in her upper chest.  Reports is made her gag and heave until she vomited.  Patient reports then she got short of breath.

## 2017-03-24 NOTE — ED Triage Notes (Signed)
Pt was at work with sudden onset of shob and vomiting accompanied by abd pain approx 1.5 hours ago. Pt with active large amount of emesis in triage, noted tachypnea when not vomiting.

## 2017-03-24 NOTE — ED Notes (Signed)
IV attempt x 2 unsuccessful, right and left antecubs.

## 2017-04-04 ENCOUNTER — Encounter (INDEPENDENT_AMBULATORY_CARE_PROVIDER_SITE_OTHER): Payer: Self-pay

## 2017-04-04 ENCOUNTER — Encounter: Payer: Self-pay | Admitting: Gastroenterology

## 2017-04-04 ENCOUNTER — Other Ambulatory Visit
Admission: RE | Admit: 2017-04-04 | Discharge: 2017-04-04 | Disposition: A | Discharge status: Home / Self Care | Payer: 59 | Source: Ambulatory Visit | Attending: Gastroenterology | Admitting: Gastroenterology

## 2017-04-04 ENCOUNTER — Ambulatory Visit: Payer: 59 | Admitting: Gastroenterology

## 2017-04-04 ENCOUNTER — Other Ambulatory Visit: Payer: Self-pay

## 2017-04-04 VITALS — BP 122/76 | HR 75 | Temp 97.9°F | Ht 63.0 in | Wt 229.6 lb

## 2017-04-04 DIAGNOSIS — D649 Anemia, unspecified: Secondary | ICD-10-CM | POA: Insufficient documentation

## 2017-04-04 LAB — VITAMIN B12: Vitamin B-12: 522 pg/mL (ref 180–914)

## 2017-04-04 LAB — FOLATE: Folate: 7.3 ng/mL (ref >5.9)

## 2017-04-04 LAB — IRON AND TIBC
Iron: 110 ug/dL (ref 28–170)
Saturation Ratios: 33 % — ABNORMAL HIGH (ref 10.4–31.8)
TIBC: 331 ug/dL (ref 250–450)
UIBC: 221 ug/dL

## 2017-04-04 LAB — FERRITIN: Ferritin: 204 ng/mL (ref 11–307)

## 2017-04-04 NOTE — Progress Notes (Signed)
Kelly Darby, MD [address]  [place]  [place], [place] [address]  Main: [phone]  Fax: [phone]    Gastroenterology Consultation  Referring Provider:     Sindy Guadeloupe, MD Primary Care Physician:  Einar Pheasant, MD Primary Gastroenterologist:  Dr. Cephas Wallace Reason for Consultation:     Iron deficiency anemia        HPI:   Kelly Wallace is a 67 y.o. AA female referred by Dr. Einar Pheasant, MD  for consultation & management of chronic, severe iron deficiency anemia. She had gastric bypass in 2005 and patient reports that she is known to be anemic since gastric bypass. She is not on oral iron supplementation. Her ferritin levels have been chronically low less than 10. She established care with Dr. Janese Banks at [place] [place] for IV iron infusions last month. She received 2 iron infusions so far. She also has severe B12 deficiency for which she has received 4 B12 injections every week. She reports that since receiving iron infusions, she feels significantly better. Her hemoglobin was as low as 9.3 last month which responded to B12 and iron therapy, most recent hemoglobin 11.4 on 03/24/2017. At present, patient denies GI symptoms including melena, hematochezia, abdominal pain, hematemesis. She denies smoking tobacco or consuming alcohol  NSAIDs: none  Antiplts/Anticoagulants/Anti thrombotics: none  GI Procedures:  Colonoscopy by Dr. Donnella Sham in 2013 Normal Did not have EGD in the past  Past Medical History:  Diagnosis Date  . Anemia    not currently being treated for this  . Arthritis   . History of colon polyps   . Hypertension     Past Surgical History:  Procedure Laterality Date  . CHOLECYSTECTOMY  1989  . GASTRIC BYPASS  03/21/03  . JOINT REPLACEMENT Right 04/2014   TOTAL KNEE REPLACEMENT, DR. Rudene Christians, [place]  . TOTAL KNEE ARTHROPLASTY Left 02/27/2016   Procedure: TOTAL KNEE ARTHROPLASTY;  Surgeon: Hessie Knows, MD;  Location: [facility] ORS;  Service:  Orthopedics;  Laterality: Left;  . TUBAL LIGATION  1976     Current Outpatient Medications:  .  lisinopril (PRINIVIL,ZESTRIL) 10 MG tablet, Take 1 tablet (10 mg total) by mouth daily., Disp: 90 tablet, Rfl: 0   Family History  Problem Relation Age of Onset  . Hypertension Mother   . Diabetes Mother   . Cancer Maternal Aunt        question of type  . Cancer Maternal Grandmother        colon  . Breast cancer Neg Hx      Social History   Tobacco Use  . Smoking status: Never Smoker  . Smokeless tobacco: Never Used  Substance Use Topics  . Alcohol use: No    Alcohol/week: 0.0 oz  . Drug use: No    Allergies as of 04/04/2017  . (No Known Allergies)    Review of Systems:    All systems reviewed and negative except where noted in HPI.   Physical Exam:  BP 122/76   Pulse 75   Temp 97.9 F (36.6 C) (Oral)   Ht 5\' 3"  (1.6 m)   Wt 229 lb 9.6 oz (104.1 kg)   LMP 04/06/2009   BMI 40.67 kg/m  Patient's last menstrual period was 04/06/2009.  General:   Alert,  Well-developed, well-nourished, pleasant and cooperative in NAD Head:  Normocephalic and atraumatic. Eyes:  Sclera clear, no icterus.   Conjunctiva pink. Ears:  Normal auditory acuity. Nose:  No deformity, discharge, or  lesions. Mouth:  No deformity or lesions,oropharynx pink & moist. Neck:  Supple; no masses or thyromegaly. Lungs:  Respirations even and unlabored.  Clear throughout to auscultation.   No wheezes, crackles, or rhonchi. No acute distress. Heart:  Regular rate and rhythm; no murmurs, clicks, rubs, or gallops. Abdomen:  Normal bowel sounds. Soft, non-tender and non-distended without masses, hepatosplenomegaly or hernias noted.  No guarding or rebound tenderness.   Rectal: Not performed Msk:  Symmetrical without gross deformities. Good, equal movement & strength bilaterally. Pulses:  Normal pulses noted. Extremities:  No clubbing or edema.  No cyanosis. Neurologic:  Alert and oriented x3;  grossly  normal neurologically. Skin:  Intact without significant lesions or rashes. No jaundice. Lymph Nodes:  No significant cervical adenopathy. Psych:  Alert and cooperative. Normal mood and affect.  Imaging Studies: reviewed  Assessment and Plan:   Kelly Wallace is a 67 y.o. African-American female with obesity, status post gastric bypass seen in consultation for chronic iron deficiency and B12 deficiency anemia. This is probably secondary to malabsorption in the setting of gastric bypass. She is responding to iron and B12 therapies. However, given her age and never had an EGD before, I recommend complete evaluation for iron deficiency anemia  Discussed with her about EGD and colonoscopy and patient is agreeable. Recommend VCE based on EGD and colonoscopy findings   Follow up in 3 months   Kelly Darby, MD

## 2017-04-05 LAB — COPPER, SERUM: Copper: 102 ug/dL (ref 72–166)

## 2017-04-08 ENCOUNTER — Telehealth: Payer: Self-pay

## 2017-04-08 NOTE — Telephone Encounter (Signed)
-----   Message from Lin Landsman, MD sent at 04/07/2017  4:32 PM EDT ----- Please inform patient that her B12, ferritin, serum copper and folate levels are all normal  Sherri Sear, M.D.

## 2017-04-08 NOTE — Telephone Encounter (Signed)
Pt notified of normal lab results.

## 2017-04-10 ENCOUNTER — Other Ambulatory Visit: Payer: Self-pay

## 2017-04-10 ENCOUNTER — Ambulatory Visit: Payer: 59 | Admitting: Anesthesiology

## 2017-04-10 ENCOUNTER — Ambulatory Visit
Admission: RE | Admit: 2017-04-10 | Discharge: 2017-04-10 | Disposition: A | Discharge status: Home / Self Care | Payer: 59 | Source: Ambulatory Visit | Attending: Gastroenterology | Admitting: Gastroenterology

## 2017-04-10 ENCOUNTER — Encounter
Admission: RE | Disposition: A | Discharge status: Home / Self Care | Payer: Self-pay | Source: Ambulatory Visit | Attending: Gastroenterology

## 2017-04-10 ENCOUNTER — Encounter: Payer: Self-pay | Admitting: *Deleted

## 2017-04-10 DIAGNOSIS — Z86010 Personal history of colonic polyps: Secondary | ICD-10-CM | POA: Diagnosis not present

## 2017-04-10 DIAGNOSIS — Z79899 Other long term (current) drug therapy: Secondary | ICD-10-CM | POA: Insufficient documentation

## 2017-04-10 DIAGNOSIS — M1712 Unilateral primary osteoarthritis, left knee: Secondary | ICD-10-CM | POA: Diagnosis not present

## 2017-04-10 DIAGNOSIS — Z980 Intestinal bypass and anastomosis status: Secondary | ICD-10-CM | POA: Insufficient documentation

## 2017-04-10 DIAGNOSIS — D508 Other iron deficiency anemias: Secondary | ICD-10-CM | POA: Diagnosis not present

## 2017-04-10 DIAGNOSIS — I10 Essential (primary) hypertension: Secondary | ICD-10-CM | POA: Insufficient documentation

## 2017-04-10 DIAGNOSIS — Z96653 Presence of artificial knee joint, bilateral: Secondary | ICD-10-CM | POA: Diagnosis not present

## 2017-04-10 DIAGNOSIS — D123 Benign neoplasm of transverse colon: Secondary | ICD-10-CM | POA: Insufficient documentation

## 2017-04-10 DIAGNOSIS — K644 Residual hemorrhoidal skin tags: Secondary | ICD-10-CM | POA: Insufficient documentation

## 2017-04-10 DIAGNOSIS — Z9884 Bariatric surgery status: Secondary | ICD-10-CM | POA: Diagnosis not present

## 2017-04-10 DIAGNOSIS — K635 Polyp of colon: Secondary | ICD-10-CM | POA: Diagnosis not present

## 2017-04-10 DIAGNOSIS — D509 Iron deficiency anemia, unspecified: Secondary | ICD-10-CM | POA: Insufficient documentation

## 2017-04-10 DIAGNOSIS — D649 Anemia, unspecified: Secondary | ICD-10-CM

## 2017-04-10 HISTORY — PX: COLONOSCOPY WITH PROPOFOL: SHX5780

## 2017-04-10 HISTORY — PX: ESOPHAGOGASTRODUODENOSCOPY (EGD) WITH PROPOFOL: SHX5813

## 2017-04-10 SURGERY — COLONOSCOPY WITH PROPOFOL
Anesthesia: General

## 2017-04-10 MED ORDER — SODIUM CHLORIDE 0.9 % IV SOLN
INTRAVENOUS | Status: DC
  Administered 2017-04-10: 12:00:00 via INTRAVENOUS

## 2017-04-10 MED ORDER — LIDOCAINE HCL (CARDIAC) 20 MG/ML IV SOLN
INTRAVENOUS | Status: DC | PRN
  Administered 2017-04-10: 40 mg via INTRAVENOUS

## 2017-04-10 MED ORDER — LIDOCAINE HCL (PF) 1 % IJ SOLN
INTRAMUSCULAR | Status: AC
  Filled 2017-04-10: qty 2

## 2017-04-10 MED ORDER — GLYCOPYRROLATE 0.2 MG/ML IJ SOLN
INTRAMUSCULAR | Status: DC | PRN
  Administered 2017-04-10: 0.2 mg via INTRAVENOUS

## 2017-04-10 MED ORDER — PROPOFOL 500 MG/50ML IV EMUL
INTRAVENOUS | Status: DC | PRN
  Administered 2017-04-10: 140 ug/kg/min via INTRAVENOUS

## 2017-04-10 MED ORDER — PROPOFOL 10 MG/ML IV BOLUS
INTRAVENOUS | Status: DC | PRN
  Administered 2017-04-10: 60 mg via INTRAVENOUS

## 2017-04-10 NOTE — Transfer of Care (Signed)
Immediate Anesthesia Transfer of Care Note  Patient: MILYNN QUIRION  Procedure(s) Performed: COLONOSCOPY WITH PROPOFOL (N/A ) ESOPHAGOGASTRODUODENOSCOPY (EGD) WITH PROPOFOL (N/A )  Patient Location: PACU  Anesthesia Type:General  Level of Consciousness: awake, alert  and oriented  Airway & Oxygen Therapy: Patient Spontanous Breathing and Patient connected to nasal cannula oxygen  Post-op Assessment: Report given to RN and Post -op Vital signs reviewed and stable  Post vital signs: Reviewed and stable  Last Vitals:  Vitals Value Taken Time  BP 126/70 04/10/2017 12:31 PM  Temp 36.1 C 04/10/2017 12:31 PM  Pulse 88 04/10/2017 12:31 PM  Resp 16 04/10/2017 12:31 PM  SpO2 99 % 04/10/2017 12:31 PM    Last Pain:  Vitals:   04/10/17 1106  TempSrc: Tympanic  PainSc: 0-No pain         Complications: No apparent anesthesia complications

## 2017-04-10 NOTE — H&P (Signed)
Kelly Darby, MD [address]  [place]  [place], [place] [address]  Main: [phone]  Fax: [phone] Pager: [phone]  Primary Care Physician:  Kelly Pheasant, MD Primary Gastroenterologist:  Dr. Cephas Wallace  Pre-Procedure History & Physical: HPI:  Kelly Wallace is a 67 y.o. female is here for an endoscopy and colonoscopy.   Past Medical History:  Diagnosis Date  . Anemia    not currently being treated for this  . Arthritis   . History of colon polyps   . Hypertension     Past Surgical History:  Procedure Laterality Date  . CHOLECYSTECTOMY  1989  . GASTRIC BYPASS  03/21/03  . JOINT REPLACEMENT Right 04/2014   TOTAL KNEE REPLACEMENT, DR. Rudene Wallace, [place]  . TOTAL KNEE ARTHROPLASTY Left 02/27/2016   Procedure: TOTAL KNEE ARTHROPLASTY;  Surgeon: Kelly Knows, MD;  Location: [facility] ORS;  Service: Orthopedics;  Laterality: Left;  . TUBAL LIGATION  1976    Prior to Admission medications   Medication Sig Start Date End Date Taking? Authorizing Provider  lisinopril (PRINIVIL,ZESTRIL) 10 MG tablet Take 1 tablet (10 mg total) by mouth daily. 02/26/17  Yes Kelly Pheasant, MD    Allergies as of 04/04/2017  . (No Known Allergies)    Family History  Problem Relation Age of Onset  . Hypertension Mother   . Diabetes Mother   . Cancer Maternal Aunt        question of type  . Cancer Maternal Grandmother        colon  . Breast cancer Neg Hx     Social History   Socioeconomic History  . Marital status: Married    Spouse name: Not on file  . Number of children: 2  . Years of education: Not on file  . Highest education level: Not on file  Occupational History    Employer: [employer]  . Financial resource strain: Not on file  . Food insecurity:    Worry: Not on file    Inability: Not on file  . Transportation needs:    Medical: Not on file    Non-medical: Not on file  Tobacco Use  . Smoking status: Never Smoker  . Smokeless tobacco: Never Used    Substance and Sexual Activity  . Alcohol use: No    Alcohol/week: 0.0 oz  . Drug use: No  . Sexual activity: Yes  Lifestyle  . Physical activity:    Days per week: Not on file    Minutes per session: Not on file  . Stress: Not on file  Relationships  . Social connections:    Talks on phone: Not on file    Gets together: Not on file    Attends religious service: Not on file    Active member of club or organization: Not on file    Attends meetings of clubs or organizations: Not on file    Relationship status: Not on file  . Intimate partner violence:    Fear of current or ex partner: Not on file    Emotionally abused: Not on file    Physically abused: Not on file    Forced sexual activity: Not on file  Other Topics Concern  . Not on file  Social History Narrative  . Not on file    Review of Systems: See HPI, otherwise negative ROS  Physical Exam: BP (!) 156/85   Pulse 69   Temp (!) 96 F (35.6 C) (Tympanic)   Resp  18   Ht 5\' 3"  (1.6 m)   Wt 228 lb (103.4 kg)   LMP 04/06/2009   SpO2 100%   BMI 40.39 kg/m  General:   Alert,  pleasant and cooperative in NAD Head:  Normocephalic and atraumatic. Neck:  Supple; no masses or thyromegaly. Lungs:  Clear throughout to auscultation.    Heart:  Regular rate and rhythm. Abdomen:  Soft, nontender and nondistended. Normal bowel sounds, without guarding, and without rebound.   Neurologic:  Alert and  oriented x4;  grossly normal neurologically.  Impression/Plan: Kelly Wallace is here for an endoscopy and colonoscopy to be performed for iron deficiency anemia  Risks, benefits, limitations, and alternatives regarding  endoscopy and colonoscopy have been reviewed with the patient.  Questions have been answered.  All parties agreeable.   Kelly Sear, MD  04/10/2017, 11:47 AM

## 2017-04-10 NOTE — Op Note (Signed)
[facility] Gastroenterology Patient Name: Kelly Wallace Procedure Date: 04/10/2017 11:48 AM MRN: [id number] Account #: [id number] Date of Birth: 1950-07-20 Admit Type: Outpatient Age: 67 Room: [facility] ENDO ROOM 2 Gender: Female Note Status: Finalized Procedure:            Upper GI endoscopy Indications:          Unexplained iron deficiency anemia Providers:            Lin Landsman MD, MD Referring MD:         Einar Pheasant, MD (Referring MD) Medicines:            Monitored Anesthesia Care Complications:        No immediate complications. Estimated blood loss: None. Procedure:            Pre-Anesthesia Assessment:                       - Prior to the procedure, a History and Physical was                        performed, and patient medications and allergies were                        reviewed. The patient is competent. The risks and                        benefits of the procedure and the sedation options and                        risks were discussed with the patient. All questions                        were answered and informed consent was obtained.                        Patient identification and proposed procedure were                        verified by the physician, the nurse, the                        anesthesiologist, the anesthetist and the technician in                        the pre-procedure area in the procedure room in the                        endoscopy suite. Mental Status Examination: alert and                        oriented. Airway Examination: normal oropharyngeal                        airway and neck mobility. Respiratory Examination:                        clear to auscultation. CV Examination: normal.                        Prophylactic Antibiotics: The patient does not require  prophylactic antibiotics. Prior Anticoagulants: The                        patient has taken no previous anticoagulant or               antiplatelet agents. ASA Grade Assessment: III - A                        patient with severe systemic disease. After reviewing                        the risks and benefits, the patient was deemed in                        satisfactory condition to undergo the procedure. The                        anesthesia plan was to use monitored anesthesia care                        (MAC). Immediately prior to administration of                        medications, the patient was re-assessed for adequacy                        to receive sedatives. The heart rate, respiratory rate,                        oxygen saturations, blood pressure, adequacy of                        pulmonary ventilation, and response to care were                        monitored throughout the procedure. The physical status                        of the patient was re-assessed after the procedure.                       After obtaining informed consent, the endoscope was                        passed under direct vision. Throughout the procedure,                        the patient's blood pressure, pulse, and oxygen                        saturations were monitored continuously. The Endoscope                        was introduced through the mouth, and advanced to the                        afferent jejunal loop. The upper GI endoscopy was                        accomplished without difficulty. The patient tolerated  the procedure well. Findings:      The gastroesophageal junction and examined esophagus were normal.      Evidence of a Roux-en-Y gastrojejunostomy was found. The gastrojejunal       anastomosis was characterized by healthy appearing mucosa. This was       traversed. The pouch-to-jejunum limb was characterized by healthy       appearing mucosa. The duodenum-to-jejunum limb was not examined as it       could not be reached. Impression:           - Normal gastroesophageal junction  and esophagus.                       - Roux-en-Y gastrojejunostomy with gastrojejunal                        anastomosis characterized by healthy appearing mucosa.                       - No specimens collected. Recommendation:       - No upper source of IDA identified on EGD exam Procedure Code(s):    --- Professional ---                       [phone], Esophagogastroduodenoscopy, flexible, transoral;                        diagnostic, including collection of specimen(s) by                        brushing or washing, when performed (separate procedure) Diagnosis Code(s):    --- Professional ---                       Z98.0, Intestinal bypass and anastomosis status                       D50.9, Iron deficiency anemia, unspecified CPT copyright 2016 American Medical Association. All rights reserved. The codes documented in this report are preliminary and upon coder review may  be revised to meet current compliance requirements. Dr. Ulyess Mort Lin Landsman MD, MD 04/10/2017 12:08:52 PM This report has been signed electronically. Number of Addenda: 0 Note Initiated On: 04/10/2017 11:48 AM      [facility]

## 2017-04-10 NOTE — Op Note (Signed)
[facility] Gastroenterology Patient Name: Kelly Wallace Procedure Date: 04/10/2017 11:45 AM MRN: [id number] Account #: [id number] Date of Birth: [birth year] Admit Type: Outpatient Age: 67 Room: [facility] ENDO ROOM 2 Gender: Female Note Status: Finalized Procedure:            Colonoscopy Indications:          Unexplained iron deficiency anemia Providers:            Lin Landsman MD, MD Medicines:            Monitored Anesthesia Care Complications:        No immediate complications. Estimated blood loss: None. Procedure:            Pre-Anesthesia Assessment:                       - Prior to the procedure, a History and Physical was                        performed, and patient medications and allergies were                        reviewed. The patient is competent. The risks and                        benefits of the procedure and the sedation options and                        risks were discussed with the patient. All questions                        were answered and informed consent was obtained.                        Patient identification and proposed procedure were                        verified by the physician, the nurse, the                        anesthesiologist, the anesthetist and the technician in                        the pre-procedure area in the procedure room in the                        endoscopy suite. Mental Status Examination: alert and                        oriented. Airway Examination: normal oropharyngeal                        airway and neck mobility. Respiratory Examination:                        clear to auscultation. CV Examination: normal.                        Prophylactic Antibiotics: The patient does not require  prophylactic antibiotics. Prior Anticoagulants: The                        patient has taken no previous anticoagulant or                        antiplatelet agents. ASA Grade Assessment: III - A                         patient with severe systemic disease. After reviewing                        the risks and benefits, the patient was deemed in                        satisfactory condition to undergo the procedure. The                        anesthesia plan was to use monitored anesthesia care                        (MAC). Immediately prior to administration of                        medications, the patient was re-assessed for adequacy                        to receive sedatives. The heart rate, respiratory rate,                        oxygen saturations, blood pressure, adequacy of                        pulmonary ventilation, and response to care were                        monitored throughout the procedure. The physical status                        of the patient was re-assessed after the procedure.                       After obtaining informed consent, the colonoscope was                        passed under direct vision. Throughout the procedure,                        the patient's blood pressure, pulse, and oxygen                        saturations were monitored continuously. The                        Colonoscope was introduced through the anus and                        advanced to the 10 cm into the ileum. The colonoscopy  was performed without difficulty. The patient tolerated                        the procedure well. The quality of the bowel                        preparation was evaluated using the BBPS [facility] Bowel                        Preparation Scale) with scores of: Right Colon = 3,                        Transverse Colon = 3 and Left Colon = 3 (entire mucosa                        seen well with no residual staining, small fragments of                        stool or opaque liquid). The total BBPS score equals 9. Findings:      The perianal exam findings include non-thrombosed external hemorrhoids.      The terminal ileum appeared  normal.      A 5 mm polyp was found in the transverse colon. The polyp was sessile.       The polyp was removed with a cold snare. Resection and retrieval were       complete.      Non-bleeding external hemorrhoids were found during retroflexion. The       hemorrhoids were large. Impression:           - Non-thrombosed external hemorrhoids found on perianal                        exam.                       - The examined portion of the ileum was normal.                       - One 5 mm polyp in the transverse colon, removed with                        a cold snare. Resected and retrieved.                       - Non-bleeding external hemorrhoids. Recommendation:       - Repeat colonoscopy in 5 years for surveillance.                       - Discharge patient to home.                       - Resume previous diet today.                       - Continue present medications.                       - Await pathology results. Procedure Code(s):    --- Professional ---  45385, Colonoscopy, flexible; with removal of tumor(s),                        polyp(s), or other lesion(s) by snare technique Diagnosis Code(s):    --- Professional ---                       K64.4, Residual hemorrhoidal skin tags                       D12.3, Benign neoplasm of transverse colon (hepatic                        flexure or splenic flexure)                       D50.9, Iron deficiency anemia, unspecified CPT copyright 2016 American Medical Association. All rights reserved. The codes documented in this report are preliminary and upon coder review may  be revised to meet current compliance requirements. Dr. Ulyess Mort Lin Landsman MD, MD 04/10/2017 12:28:56 PM This report has been signed electronically. Number of Addenda: 0 Note Initiated On: 04/10/2017 11:45 AM Scope Withdrawal Time: 0 hours 10 minutes 54 seconds  Total Procedure Duration: 0 hours 16 minutes 2 seconds       [facility]

## 2017-04-10 NOTE — Anesthesia Post-op Follow-up Note (Signed)
Anesthesia QCDR form completed.        

## 2017-04-10 NOTE — Anesthesia Postprocedure Evaluation (Signed)
Anesthesia Post Note  Patient: DAROTHY COURTRIGHT  Procedure(s) Performed: COLONOSCOPY WITH PROPOFOL (N/A ) ESOPHAGOGASTRODUODENOSCOPY (EGD) WITH PROPOFOL (N/A )  Patient location during evaluation: Endoscopy Anesthesia Type: General Level of consciousness: awake and alert Pain management: pain level controlled Vital Signs Assessment: post-procedure vital signs reviewed and stable Respiratory status: spontaneous breathing, nonlabored ventilation and respiratory function stable Cardiovascular status: blood pressure returned to baseline and stable Postop Assessment: no apparent nausea or vomiting Anesthetic complications: no     Last Vitals:  Vitals Value Taken Time  BP    Temp    Pulse    Resp    SpO2      Last Pain:  Vitals:   04/10/17 1251  TempSrc:   PainSc: 0-No pain                 Alphonsus Sias

## 2017-04-10 NOTE — Anesthesia Preprocedure Evaluation (Addendum)
Anesthesia Evaluation  Patient identified by MRN, date of birth, ID band Patient awake    Reviewed: Allergy & Precautions, H&P , NPO status , reviewed documented beta blocker date and time   Airway Mallampati: III       Dental  (+) Missing   Pulmonary    Pulmonary exam normal        Cardiovascular hypertension, Normal cardiovascular exam     Neuro/Psych    GI/Hepatic   Endo/Other    Renal/GU      Musculoskeletal   Abdominal   Peds  Hematology  (+) anemia ,   Anesthesia Other Findings PMH Essential hypertension, benign Elevated alkaline phosphatase level Anemia Personal history of colonic polyps Nipple discharge Iron deficiency Knee pain, bilateral  PSH TUBAL LIGATION CHOLECYSTECTOMY GASTRIC BYPASS JOINT REPLACEMENT TOTAL KNEE ARTHROPLASTY      Reproductive/Obstetrics                           Anesthesia Physical Anesthesia Plan  ASA: III  Anesthesia Plan: General   Post-op Pain Management:    Induction:   PONV Risk Score and Plan: Propofol infusion  Airway Management Planned:   Additional Equipment:   Intra-op Plan:   Post-operative Plan:   Informed Consent: I have reviewed the patients History and Physical, chart, labs and discussed the procedure including the risks, benefits and alternatives for the proposed anesthesia with the patient or authorized representative who has indicated his/her understanding and acceptance.   Dental Advisory Given  Plan Discussed with: CRNA  Anesthesia Plan Comments:       Anesthesia Quick Evaluation

## 2017-04-11 ENCOUNTER — Encounter: Payer: Self-pay | Admitting: Gastroenterology

## 2017-04-11 LAB — SURGICAL PATHOLOGY

## 2017-04-14 ENCOUNTER — Encounter: Payer: Self-pay | Admitting: Gastroenterology

## 2017-05-06 ENCOUNTER — Other Ambulatory Visit (HOSPITAL_COMMUNITY)
Admission: RE | Admit: 2017-05-06 | Discharge: 2017-05-06 | Disposition: A | Discharge status: Home / Self Care | Payer: 59 | Source: Ambulatory Visit | Attending: Internal Medicine | Admitting: Internal Medicine

## 2017-05-06 ENCOUNTER — Ambulatory Visit (INDEPENDENT_AMBULATORY_CARE_PROVIDER_SITE_OTHER): Payer: 59 | Admitting: Internal Medicine

## 2017-05-06 DIAGNOSIS — D649 Anemia, unspecified: Secondary | ICD-10-CM | POA: Diagnosis not present

## 2017-05-06 DIAGNOSIS — Z0000 Encounter for general adult medical examination without abnormal findings: Secondary | ICD-10-CM

## 2017-05-06 DIAGNOSIS — I10 Essential (primary) hypertension: Secondary | ICD-10-CM | POA: Diagnosis not present

## 2017-05-06 DIAGNOSIS — Z124 Encounter for screening for malignant neoplasm of cervix: Secondary | ICD-10-CM

## 2017-05-06 DIAGNOSIS — R200 Anesthesia of skin: Secondary | ICD-10-CM

## 2017-05-06 DIAGNOSIS — R748 Abnormal levels of other serum enzymes: Secondary | ICD-10-CM

## 2017-05-06 DIAGNOSIS — E611 Iron deficiency: Secondary | ICD-10-CM | POA: Diagnosis not present

## 2017-05-06 NOTE — Progress Notes (Addendum)
Patient ID: DENIKA KRONE, female   DOB: 04/14/1950, 67 y.o.   MRN: [id number]   Subjective:    Patient ID: [name], female    DOB: 12/22/1950, 67 y.o.   MRN: [id number]  HPI  Patient with past history of hypertension and anemia.  She comes in today to follow up on these issues as well as for a complete physical exam.  She reports she is feeling better.  Is receiving iron injections.  hgb improved.  Energy better.  Saw Dr Marius Ditch and is seeing Dr Janese Banks.  S/p EGD and colonoscopy.  Recommended f/u colonoscopy in 5 years.  No chest pain.  No sob.  No acid reflux.  No abdominal pain.  Bowels moving.  Taking her medication.  Blood pressure is doing well.  She still reports bilateral leg numbness.  Notices after sitting for a while.  States starts at her knees and extends to her feet.  Occasional back pain with walking.  Had discussed NCS.     Past Medical History:  Diagnosis Date  . Anemia    not currently being treated for this  . Arthritis   . History of colon polyps   . Hypertension    Past Surgical History:  Procedure Laterality Date  . CHOLECYSTECTOMY  1989  . COLONOSCOPY WITH PROPOFOL N/A 04/10/2017   Procedure: COLONOSCOPY WITH PROPOFOL;  Surgeon: Lin Landsman, MD;  Location: [facility] ENDOSCOPY;  Service: Gastroenterology;  Laterality: N/A;  . ESOPHAGOGASTRODUODENOSCOPY (EGD) WITH PROPOFOL N/A 04/10/2017   Procedure: ESOPHAGOGASTRODUODENOSCOPY (EGD) WITH PROPOFOL;  Surgeon: Lin Landsman, MD;  [facility] ENDOSCOPY;  Service: Gastroenterology;  Laterality: N/A;  . GASTRIC BYPASS  03/21/03  . JOINT REPLACEMENT Right 04/2014   TOTAL KNEE REPLACEMENT, DR. Rudene Christians, [place]  . TOTAL KNEE ARTHROPLASTY Left 02/27/2016   Procedure: TOTAL KNEE ARTHROPLASTY;  Surgeon: Hessie Knows, MD;  Location: [facility] ORS;  Service: Orthopedics;  Laterality: Left;  . TUBAL LIGATION  1976   Family History  Problem Relation Age of Onset  . Hypertension Mother   . Diabetes Mother   . Cancer Maternal Aunt          question of type  . Cancer Maternal Grandmother        colon  . Breast cancer Neg Hx    Social History   Socioeconomic History  . Marital status: Married    Spouse name: Not on file  . Number of children: 2  . Years of education: Not on file  . Highest education level: Not on file  Occupational History    Employer: [employer]  . Financial resource strain: Not on file  . Food insecurity:    Worry: Not on file    Inability: Not on file  . Transportation needs:    Medical: Not on file    Non-medical: Not on file  Tobacco Use  . Smoking status: Never Smoker  . Smokeless tobacco: Never Used  Substance and Sexual Activity  . Alcohol use: No    Alcohol/week: 0.0 oz  . Drug use: No  . Sexual activity: Yes  Lifestyle  . Physical activity:    Days per week: Not on file    Minutes per session: Not on file  . Stress: Not on file  Relationships  . Social connections:    Talks on phone: Not on file    Gets together: Not on file    Attends religious service: Not on file    Active member of  club or organization: Not on file    Attends meetings of clubs or organizations: Not on file    Relationship status: Not on file  Other Topics Concern  . Not on file  Social History Narrative  . Not on file    Outpatient Encounter Medications as of 05/06/2017  Medication Sig  . lisinopril (PRINIVIL,ZESTRIL) 10 MG tablet Take 1 tablet (10 mg total) by mouth daily.   No facility-administered encounter medications on file as of 05/06/2017.     Review of Systems  Constitutional: Negative for appetite change and unexpected weight change.  HENT: Negative for congestion and sinus pressure.   Eyes: Negative for pain and visual disturbance.  Respiratory: Negative for cough, chest tightness and shortness of breath.   Cardiovascular: Negative for chest pain, palpitations and leg swelling.  Gastrointestinal: Negative for abdominal pain, diarrhea, nausea and vomiting.   Genitourinary: Negative for difficulty urinating and dysuria.  Musculoskeletal: Negative for joint swelling and myalgias.  Skin: Negative for color change and rash.  Neurological: Negative for dizziness, light-headedness and headaches.       Bilateral leg numbness.    Hematological: Negative for adenopathy. Does not bruise/bleed easily.  Psychiatric/Behavioral: Negative for agitation and dysphoric mood.       Objective:     Blood pressure rechecked by me:  116/76  Physical Exam  Constitutional: She is oriented to person, place, and time. She appears well-developed and well-nourished. No distress.  HENT:  Nose: Nose normal.  Mouth/Throat: Oropharynx is clear and moist.  Eyes: Right eye exhibits no discharge. Left eye exhibits no discharge. No scleral icterus.  Neck: Neck supple. No thyromegaly present.  Cardiovascular: Normal rate and regular rhythm.  Pulmonary/Chest: Breath sounds normal. No accessory muscle usage. No tachypnea. No respiratory distress. She has no decreased breath sounds. She has no wheezes. She has no rhonchi. Right breast exhibits no inverted nipple, no mass, no nipple discharge and no tenderness (no axillary adenopathy). Left breast exhibits no inverted nipple, no mass, no nipple discharge and no tenderness (no axilarry adenopathy).  Abdominal: Soft. Bowel sounds are normal. There is no tenderness.  Genitourinary:  Genitourinary Comments: Normal external genitalia.  Vaginal vault without lesions.  Cervix identified.  Pap smear performed.  Could not appreciate any adnexal masses or tenderness.    Musculoskeletal: She exhibits no edema or tenderness.  Lymphadenopathy:    She has no cervical adenopathy.  Neurological: She is alert and oriented to person, place, and time.  Skin: No rash noted. No erythema.  Psychiatric: She has a normal mood and affect. Her behavior is normal.    BP 124/76 (BP Location: Left Arm, Patient Position: Sitting, Cuff Size: Large)    Pulse 83   Temp 98 F (36.7 C) (Oral)   Resp 18   Ht 5\' 3"  (1.6 m)   Wt 231 lb 12.8 oz (105.1 kg)   LMP 04/06/2009   SpO2 98%   BMI 41.06 kg/m  Wt Readings from Last 3 Encounters:  05/06/17 231 lb 12.8 oz (105.1 kg)  04/10/17 228 lb (103.4 kg)  04/04/17 229 lb 9.6 oz (104.1 kg)     Lab Results  Component Value Date   WBC 11.2 (H) 03/24/2017   HGB 11.4 (L) 03/24/2017   HCT 36.2 03/24/2017   PLT 220 03/24/2017   GLUCOSE 175 (H) 03/24/2017   CHOL 104 07/23/2016   TRIG 54.0 07/23/2016   HDL 50.40 07/23/2016   LDLCALC 43 07/23/2016   ALT 22 03/24/2017   AST  35 03/24/2017   NA 139 03/24/2017   K 4.1 03/24/2017   CL 110 03/24/2017   CREATININE 0.73 03/24/2017   BUN 15 03/24/2017   CO2 18 (L) 03/24/2017   TSH 0.42 02/26/2017   INR 1.14 02/21/2016   HGBA1C 5.9 02/26/2017       Assessment & Plan:   Problem List Items Addressed This Visit    Anemia    Seeing hematology.  Receiving iron infusions.  Needs B12 injections.  Discussed with her today.  Last hgb improved.        Bilateral leg numbness    Persistent.  Is B12 deficient.  Continue B12 injections.  Schedule for NCS.  Further w/up pending results.        Relevant Orders   Ambulatory referral to Neurology   Elevated alkaline phosphatase level    Have discussed with hematology.  Has been stable.  No further w/up felt warranted.        Essential hypertension, benign    Blood pressure under good control.  Continue same medication regimen.  Follow pressures.  Follow metabolic panel.        Health care maintenance    Physical today 05/06/17.  Colonoscopy 04/10/17.  Recommended f/uu in 5 years.  PAP 02/24/13 - negative with negative HPV.  F/u PAP today.  Mammogram 11/06/16 - Birads I.       Iron deficiency    Receiving iron infusions.  Follow cbc.        Other Visit Diagnoses    Cervical cancer screening       Relevant Orders   Cytology - PAP (Completed)       Einar Pheasant, MD

## 2017-05-06 NOTE — Assessment & Plan Note (Addendum)
Physical today 05/06/17.  Colonoscopy 04/10/17.  Recommended f/uu in 5 years.  PAP 02/24/13 - negative with negative HPV.  F/u PAP today.  Mammogram 11/06/16 - Birads I.

## 2017-05-08 LAB — CYTOLOGY - PAP
Diagnosis: NEGATIVE
HPV: NOT DETECTED

## 2017-05-09 ENCOUNTER — Other Ambulatory Visit: Payer: 59

## 2017-05-09 ENCOUNTER — Ambulatory Visit: Payer: 59 | Admitting: Oncology

## 2017-05-09 ENCOUNTER — Encounter: Payer: Self-pay | Admitting: Internal Medicine

## 2017-05-09 NOTE — Assessment & Plan Note (Signed)
Have discussed with hematology.  Has been stable.  No further w/up felt warranted.

## 2017-05-09 NOTE — Addendum Note (Signed)
Addended by: Alisa Graff on: 05/09/2017 02:15 PM   Modules accepted: Orders

## 2017-05-09 NOTE — Assessment & Plan Note (Signed)
Seeing hematology.  Receiving iron infusions.  Needs B12 injections.  Discussed with her today.  Last hgb improved.

## 2017-05-09 NOTE — Assessment & Plan Note (Signed)
Persistent.  Is B12 deficient.  Continue B12 injections.  Schedule for NCS.  Further w/up pending results.

## 2017-05-09 NOTE — Assessment & Plan Note (Signed)
Blood pressure under good control.  Continue same medication regimen.  Follow pressures.  Follow metabolic panel.   

## 2017-05-09 NOTE — Assessment & Plan Note (Signed)
Receiving iron infusions.  Follow cbc.

## 2017-05-16 ENCOUNTER — Inpatient Hospital Stay: Payer: 59 | Attending: Oncology

## 2017-05-16 ENCOUNTER — Encounter: Payer: Self-pay | Admitting: Oncology

## 2017-05-16 ENCOUNTER — Inpatient Hospital Stay (HOSPITAL_BASED_OUTPATIENT_CLINIC_OR_DEPARTMENT_OTHER): Payer: 59 | Admitting: Oncology

## 2017-05-16 VITALS — BP 132/80 | HR 78 | Temp 96.4°F | Resp 18

## 2017-05-16 DIAGNOSIS — Z9884 Bariatric surgery status: Secondary | ICD-10-CM | POA: Insufficient documentation

## 2017-05-16 DIAGNOSIS — D509 Iron deficiency anemia, unspecified: Secondary | ICD-10-CM

## 2017-05-16 DIAGNOSIS — D649 Anemia, unspecified: Secondary | ICD-10-CM

## 2017-05-16 LAB — CBC WITH DIFFERENTIAL/PLATELET
Basophils Absolute: 0 10*3/uL (ref 0–0.1)
Basophils Relative: 1 %
Eosinophils Absolute: 0.1 10*3/uL (ref 0–0.7)
Eosinophils Relative: 2 %
HCT: 39.7 % (ref 35.0–47.0)
Hemoglobin: 13.2 g/dL (ref 12.0–16.0)
Lymphocytes Relative: 29 %
Lymphs Abs: 1.4 10*3/uL (ref 1.0–3.6)
MCH: 29.9 pg (ref 26.0–34.0)
MCHC: 33.2 g/dL (ref 32.0–36.0)
MCV: 89.9 fL (ref 80.0–100.0)
Monocytes Absolute: 0.3 10*3/uL (ref 0.2–0.9)
Monocytes Relative: 7 %
Neutro Abs: 3 10*3/uL (ref 1.4–6.5)
Neutrophils Relative %: 61 %
Platelets: 165 10*3/uL (ref 150–440)
RBC: 4.42 MIL/uL (ref 3.80–5.20)
RDW: 21.9 % — ABNORMAL HIGH (ref 11.5–14.5)
WBC: 4.9 10*3/uL (ref 3.6–11.0)

## 2017-05-16 LAB — IRON AND TIBC
Iron: 118 ug/dL (ref 28–170)
Saturation Ratios: 35 % — ABNORMAL HIGH (ref 10.4–31.8)
TIBC: 340 ug/dL (ref 250–450)
UIBC: 222 ug/dL

## 2017-05-16 LAB — FOLATE: Folate: 7.3 ng/mL (ref >5.9)

## 2017-05-16 LAB — FERRITIN: Ferritin: 28 ng/mL (ref 11–307)

## 2017-05-16 NOTE — Progress Notes (Signed)
No new changes noted today 

## 2017-05-19 NOTE — Progress Notes (Signed)
Hematology/Oncology Consult note [facility]  Telephone:([phone] [phone]  Patient Care Team: Einar Pheasant, MD as PCP - General (Internal Medicine)   Name of the patient: Kelly Wallace  [id number]  1950/07/23   Date of visit: 05/19/17  Diagnosis- iron deficiency anemia likely due to gastric bypass  Chief complaint/ Reason for visit- routine f/u of iron deficiency anemia  Heme/Onc history: patient is a 67 year old obese African-American female  who has been referred to [place] for evaluation and management of anemia.  She has a history of gastric bypass surgery back in 2005.  She is not currently taking any iron supplements.  Reports possible history of colon cancer in her maternal grandmother but she is not sure.  She denies any bleeding in her stool or urine.  Denies any gum bleeds and nosebleeds.  She uses Aleve occasionally maybe once in a few months for headaches but not consistently.  Denies any dark melanotic stools.  Denies any loss of appetite or unintentional weight loss.  She is postmenopausal   Recent CBC from 02/26/2017 showed white count of 5, H&H of 9.5/30.4 with an MCV of 78.7 and a platelet count of 232.  Ferritin levels were low at 4.9.  Of note ferritin was low at 7.45 months ago as well.  B12 levels were low at 204.  TSH was normal.  On review of her prior CBCs her hemoglobin has been between 9-10 for the last 1 year.  Borderline low MCV levels.  No other cytopenias.  Last colonoscopy in June 2013 showed internal hemorrhoids but no other abnormal findings. pateint had repeat EGD and colonoscopy inmarch 2019 which did not reveal any bleeding.s he received 2 doses of feraheme in march 2019  Interval history- reports that her energy levels are better. She denies any specific complaints today  ECOG PS- 1 Pain scale- 0   Review of systems- Review of Systems  Constitutional: Negative for chills, fever, malaise/fatigue and weight loss.    HENT: Negative for congestion, ear discharge and nosebleeds.   Eyes: Negative for blurred vision.  Respiratory: Negative for cough, hemoptysis, sputum production, shortness of breath and wheezing.   Cardiovascular: Negative for chest pain, palpitations, orthopnea and claudication.  Gastrointestinal: Negative for abdominal pain, blood in stool, constipation, diarrhea, heartburn, melena, nausea and vomiting.  Genitourinary: Negative for dysuria, flank pain, frequency, hematuria and urgency.  Musculoskeletal: Negative for back pain, joint pain and myalgias.  Skin: Negative for rash.  Neurological: Negative for dizziness, tingling, focal weakness, seizures, weakness and headaches.  Endo/Heme/Allergies: Does not bruise/bleed easily.  Psychiatric/Behavioral: Negative for depression and suicidal ideas. The patient does not have insomnia.       No Known Allergies   Past Medical History:  Diagnosis Date  . Anemia    not currently being treated for this  . Arthritis   . History of colon polyps   . Hypertension      Past Surgical History:  Procedure Laterality Date  . CHOLECYSTECTOMY  1989  . COLONOSCOPY WITH PROPOFOL N/A 04/10/2017   Procedure: COLONOSCOPY WITH PROPOFOL;  Surgeon: Lin Landsman, MD;  [facility] ENDOSCOPY;  Service: Gastroenterology;  Laterality: N/A;  . ESOPHAGOGASTRODUODENOSCOPY (EGD) WITH PROPOFOL N/A 04/10/2017   Procedure: ESOPHAGOGASTRODUODENOSCOPY (EGD) WITH PROPOFOL;  Surgeon: Lin Landsman, MD;  Location: [facility] ENDOSCOPY;  Service: Gastroenterology;  Laterality: N/A;  . GASTRIC BYPASS  03/21/03  . JOINT REPLACEMENT Right 04/2014   TOTAL KNEE REPLACEMENT, DR. Rudene Christians, [place]  . TOTAL  KNEE ARTHROPLASTY Left 02/27/2016   Procedure: TOTAL KNEE ARTHROPLASTY;  Surgeon: Hessie Knows, MD;  Location: [facility] ORS;  Service: Orthopedics;  Laterality: Left;  . TUBAL LIGATION  1976    Social History   Socioeconomic History  . Marital status: Married    Spouse name:  Not on file  . Number of children: 2  . Years of education: Not on file  . Highest education level: Not on file  Occupational History    Employer: [employer]  . Financial resource strain: Not on file  . Food insecurity:    Worry: Not on file    Inability: Not on file  . Transportation needs:    Medical: Not on file    Non-medical: Not on file  Tobacco Use  . Smoking status: Never Smoker  . Smokeless tobacco: Never Used  Substance and Sexual Activity  . Alcohol use: No    Alcohol/week: 0.0 oz  . Drug use: No  . Sexual activity: Yes  Lifestyle  . Physical activity:    Days per week: Not on file    Minutes per session: Not on file  . Stress: Not on file  Relationships  . Social connections:    Talks on phone: Not on file    Gets together: Not on file    Attends religious service: Not on file    Active member of club or organization: Not on file    Attends meetings of clubs or organizations: Not on file    Relationship status: Not on file  . Intimate partner violence:    Fear of current or ex partner: Not on file    Emotionally abused: Not on file    Physically abused: Not on file    Forced sexual activity: Not on file  Other Topics Concern  . Not on file  Social History Narrative  . Not on file    Family History  Problem Relation Age of Onset  . Hypertension Mother   . Diabetes Mother   . Cancer Maternal Aunt        question of type  . Cancer Maternal Grandmother        colon  . Breast cancer Neg Hx      Current Outpatient Medications:  .  lisinopril (PRINIVIL,ZESTRIL) 10 MG tablet, Take 1 tablet (10 mg total) by mouth daily., Disp: 90 tablet, Rfl: 0  Physical exam:  Vitals:   05/16/17 1404  BP: 132/80  Pulse: 78  Resp: 18  Temp: (!) 96.4 F (35.8 C)  SpO2: 98%   Physical Exam  Constitutional: She is oriented to person, place, and time. She appears well-developed and well-nourished.  HENT:  Head: Normocephalic and atraumatic.  Eyes:  Pupils are equal, round, and reactive to light. EOM are normal.  Neck: Normal range of motion.  Cardiovascular: Normal rate, regular rhythm and normal heart sounds.  Pulmonary/Chest: Effort normal and breath sounds normal.  Abdominal: Soft. Bowel sounds are normal.  Neurological: She is alert and oriented to person, place, and time.  Skin: Skin is warm and dry.     CMP Latest Ref Rng & Units 03/24/2017  Glucose 65 - 99 mg/dL 175(H)  BUN 6 - 20 mg/dL 15  Creatinine 0.44 - 1.00 mg/dL 0.73  Sodium 135 - 145 mmol/L 139  Potassium 3.5 - 5.1 mmol/L 4.1  Chloride 101 - 111 mmol/L 110  CO2 22 - 32 mmol/L 18(L)  Calcium 8.9 - 10.3 mg/dL 8.7(L)  Total Protein 6.5 -  8.1 g/dL 8.3(H)  Total Bilirubin 0.3 - 1.2 mg/dL 0.6  Alkaline Phos 38 - 126 U/L 204(H)  AST 15 - 41 U/L 35  ALT 14 - 54 U/L 22   CBC Latest Ref Rng & Units 05/16/2017  WBC 3.6 - 11.0 K/uL 4.9  Hemoglobin 12.0 - 16.0 g/dL 13.2  Hematocrit 35.0 - 47.0 % 39.7  Platelets 150 - 440 K/uL 165    No images are attached to the encounter.  No results found.   Assessment and plan- Patient is a 67 y.o. female with iron deficiency anemia likely secodnary to gastric bypass surgery  Patient received 2 doses of feraheme. Hb improved to 13.2 from 11.4. Ferritin is low normal but iron studies are normal. She is no longer anemic. Will hold off on IV iron at this time. Repeat cbc ferritin and iron studies in 3 and 6 months and I will see her back in 6 months for possible feraheme     Visit Diagnosis 1. Iron deficiency anemia, unspecified iron deficiency anemia type      Dr. Randa Evens, MD, MPH [facility] [phone] 05/19/2017 8:30 AM

## 2017-05-20 LAB — CELIAC DISEASE PANEL
Endomysial Ab, IgA: NEGATIVE
IgA: 292 mg/dL (ref 87–352)
Tissue Transglutaminase Ab, IgA: <2 U/mL (ref 0–3)

## 2017-07-07 ENCOUNTER — Ambulatory Visit: Payer: 59 | Admitting: Gastroenterology

## 2017-07-21 DIAGNOSIS — E559 Vitamin D deficiency, unspecified: Secondary | ICD-10-CM | POA: Diagnosis not present

## 2017-07-21 DIAGNOSIS — I739 Peripheral vascular disease, unspecified: Secondary | ICD-10-CM | POA: Diagnosis not present

## 2017-07-21 DIAGNOSIS — M79605 Pain in left leg: Secondary | ICD-10-CM | POA: Diagnosis not present

## 2017-07-21 DIAGNOSIS — M4807 Spinal stenosis, lumbosacral region: Secondary | ICD-10-CM | POA: Diagnosis not present

## 2017-07-21 DIAGNOSIS — E538 Deficiency of other specified B group vitamins: Secondary | ICD-10-CM | POA: Diagnosis not present

## 2017-07-21 DIAGNOSIS — R7309 Other abnormal glucose: Secondary | ICD-10-CM | POA: Diagnosis not present

## 2017-07-21 DIAGNOSIS — M79604 Pain in right leg: Secondary | ICD-10-CM | POA: Diagnosis not present

## 2017-07-25 ENCOUNTER — Other Ambulatory Visit: Payer: Self-pay | Admitting: Neurology

## 2017-07-25 DIAGNOSIS — M4807 Spinal stenosis, lumbosacral region: Secondary | ICD-10-CM

## 2017-07-30 DIAGNOSIS — M4807 Spinal stenosis, lumbosacral region: Secondary | ICD-10-CM | POA: Diagnosis not present

## 2017-08-01 ENCOUNTER — Encounter (INDEPENDENT_AMBULATORY_CARE_PROVIDER_SITE_OTHER): Payer: Self-pay | Admitting: Vascular Surgery

## 2017-08-01 ENCOUNTER — Ambulatory Visit (INDEPENDENT_AMBULATORY_CARE_PROVIDER_SITE_OTHER): Payer: 59 | Admitting: Vascular Surgery

## 2017-08-01 VITALS — BP 162/91 | HR 74 | Resp 16 | Ht 63.0 in | Wt 231.4 lb

## 2017-08-01 DIAGNOSIS — M79604 Pain in right leg: Secondary | ICD-10-CM

## 2017-08-01 DIAGNOSIS — M1712 Unilateral primary osteoarthritis, left knee: Secondary | ICD-10-CM | POA: Diagnosis not present

## 2017-08-01 DIAGNOSIS — M79605 Pain in left leg: Secondary | ICD-10-CM

## 2017-08-01 DIAGNOSIS — D649 Anemia, unspecified: Secondary | ICD-10-CM | POA: Diagnosis not present

## 2017-08-01 DIAGNOSIS — I10 Essential (primary) hypertension: Secondary | ICD-10-CM

## 2017-08-01 DIAGNOSIS — M79609 Pain in unspecified limb: Secondary | ICD-10-CM | POA: Insufficient documentation

## 2017-08-01 NOTE — Assessment & Plan Note (Signed)
blood pressure control important in reducing the progression of atherosclerotic disease. On appropriate oral medications.  

## 2017-08-01 NOTE — Assessment & Plan Note (Signed)
Has seen GI and hematology.  Not currently requiring any transfusion and report seems to be stable.

## 2017-08-01 NOTE — Assessment & Plan Note (Signed)

## 2017-08-01 NOTE — Patient Instructions (Signed)

## 2017-08-01 NOTE — Assessment & Plan Note (Signed)
Has had previous surgery for this

## 2017-08-01 NOTE — Progress Notes (Signed)
[name], female   DOB: [birth year], 67 y.o.   MRN: [id number]  Chief Complaint  Patient presents with  . New Patient (Initial Visit)    ref Manuella Ghazi for PAD    HPI Kelly Wallace is a 67 y.o. female.  I am asked to see the patient by Dr. Manuella Ghazi for evaluation of peripheral vascular disease.  The patient reports numbness and weakness in her legs bilaterally.  This is been going on for months now without a clear inciting event or causative factor.  Nothing really makes it much better other than moving them about.  Sitting or standing for long periods of time seem to make this worse.  She has some mild swelling but this is not a been a severe problem for her.  Both legs are affected about the same.  No previous history of ulceration or infection.  No chest pain or shortness of breath.  No fevers or chills.   Past Medical History:  Diagnosis Date  . Anemia    not currently being treated for this  . Arthritis   . History of colon polyps   . Hypertension     Past Surgical History:  Procedure Laterality Date  . CHOLECYSTECTOMY  1989  . COLONOSCOPY WITH PROPOFOL N/A 04/10/2017   Procedure: COLONOSCOPY WITH PROPOFOL;  Surgeon: Lin Landsman, MD;  Location: [facility] ENDOSCOPY;  Service: Gastroenterology;  Laterality: N/A;  . ESOPHAGOGASTRODUODENOSCOPY (EGD) WITH PROPOFOL N/A 04/10/2017   Procedure: ESOPHAGOGASTRODUODENOSCOPY (EGD) WITH PROPOFOL;  Surgeon: Lin Landsman, MD;  Location: [facility] ENDOSCOPY;  Service: Gastroenterology;  Laterality: N/A;  . GASTRIC BYPASS  03/21/03  . JOINT REPLACEMENT Right 04/2014   TOTAL KNEE REPLACEMENT, DR. Rudene Christians, [place]  . TOTAL KNEE ARTHROPLASTY Left 02/27/2016   Procedure: TOTAL KNEE ARTHROPLASTY;  Surgeon: Hessie Knows, MD;  Location: [facility] ORS;  Service: Orthopedics;  Laterality: Left;  . TUBAL LIGATION  1976    Family History  Problem Relation Age of Onset  . Hypertension Mother   . Diabetes Mother   . Cancer Maternal Aunt    question of type  . Cancer Maternal Grandmother        colon  . Breast cancer Neg Hx     Social History Social History   Tobacco Use  . Smoking status: Never Smoker  . Smokeless tobacco: Never Used  Substance Use Topics  . Alcohol use: No    Alcohol/week: 0.0 oz  . Drug use: No     No Known Allergies  Current Outpatient Medications  Medication Sig Dispense Refill  . lisinopril (PRINIVIL,ZESTRIL) 10 MG tablet Take 1 tablet (10 mg total) by mouth daily. 90 tablet 0   No current facility-administered medications for this visit.       REVIEW OF SYSTEMS (Negative unless checked)  Constitutional: [] Weight loss  [] Fever  [] Chills Cardiac: [] Chest pain   [] Chest pressure   [] Palpitations   [] Shortness of breath when laying flat   [] Shortness of breath at rest   [] Shortness of breath with exertion. Vascular:  [] Pain in legs with walking   [] Pain in legs at rest   [] Pain in legs when laying flat   [] Claudication   [] Pain in feet when walking  [] Pain in feet at rest  [] Pain in feet when laying flat   [] History of DVT   [] Phlebitis   [] Swelling in legs   [] Varicose veins   [] Non-healing ulcers Pulmonary:   [] Uses home oxygen   [] Productive cough   []   Hemoptysis   [] Wheeze  [] COPD   [] Asthma Neurologic:  [] Dizziness  [] Blackouts   [] Seizures   [] History of stroke   [] History of TIA  [] Aphasia   [] Temporary blindness   [] Dysphagia   [] Weakness or numbness in arms   [] Weakness or numbness in legs Musculoskeletal:  [x] Arthritis   [] Joint swelling   [] Joint pain   [] Low back pain Hematologic:  [] Easy bruising  [] Easy bleeding   [] Hypercoagulable state   [x] Anemic  [] Hepatitis Gastrointestinal:  [] Blood in stool   [] Vomiting blood  [] Gastroesophageal reflux/heartburn   [] Abdominal pain Genitourinary:  [] Chronic kidney disease   [] Difficult urination  [] Frequent urination  [] Burning with urination   [] Hematuria Skin:  [] Rashes   [] Ulcers   [] Wounds Psychological:  [] History of anxiety   []   History of major depression.    Physical Exam BP (!) 162/91 (BP Location: Right Arm)   Pulse 74   Resp 16   Ht 5\' 3"  (1.6 m)   Wt 231 lb 6.4 oz (105 kg)   LMP 04/06/2009   BMI 40.99 kg/m  Gen:  WD/WN, NAD Head: [place]/AT, No temporalis wasting.  Ear/Nose/Throat: Hearing grossly intact, nares w/o erythema or drainage, oropharynx w/o Erythema/Exudate Eyes: Conjunctiva clear, sclera non-icteric.  Disconjugate gaze Neck: trachea midline.   Pulmonary:  Good air movement, respirations not labored, no use of accessory muscles Cardiac: RRR, no JVD Vascular:  Vessel Right Left  Radial Palpable Palpable                          PT  1+ palpable  1+ palpable  DP  2+ palpable  1+ palpable   Musculoskeletal: M/S 5/5 throughout.  Extremities without ischemic changes.  No deformity or atrophy.  Trace bilateral lower extremity edema. Neurologic: Sensation grossly intact in extremities.  Symmetrical.  Speech is fluent. Motor exam as listed above. Psychiatric: Judgment intact, Mood & affect appropriate for pt's clinical situation. Dermatologic: No rashes or ulcers noted.  No cellulitis or open wounds.    Radiology No results found.  Labs Recent Results (from the past 2160 hour(s))  Cytology - PAP     Status: None   Collection Time: 05/06/17 12:00 AM  Result Value Ref Range   Adequacy      Satisfactory for evaluation  endocervical/transformation zone component PRESENT.   Diagnosis      NEGATIVE FOR INTRAEPITHELIAL LESIONS OR MALIGNANCY.   HPV NOT DETECTED     Comment: Normal Reference Range - NOT Detected   Material Submitted CervicoVaginal Pap [ThinPrep Imaged]   Celiac Disease Panel     Status: None   Collection Time: 05/16/17  1:38 PM  Result Value Ref Range   Endomysial Ab, IgA Negative Negative   Tissue Transglutaminase Ab, IgA <2 0 - 3 U/mL    Comment: (NOTE)                              Negative        0 -  3                              Weak Positive   4 - 10                               Positive           >  10 Tissue Transglutaminase (tTG) has been identified as the endomysial antigen.  Studies have demonstr- ated that endomysial IgA antibodies have over 99% specificity for gluten sensitive enteropathy.    IgA 292 87 - 352 mg/dL    Comment: (NOTE) Performed At: [facility] [place], [place] [id number] Rush Farmer MD IR:[id number] Performed at [facility], [address] [place], [place] [address]   Folate     Status: None   Collection Time: 05/16/17  1:38 PM  Result Value Ref Range   Folate 7.3 >5.9 ng/mL    Comment: Performed at [facility], [address] [place], [place] [address]  CBC with Differential/Platelet     Status: Abnormal   Collection Time: 05/16/17  1:38 PM  Result Value Ref Range   WBC 4.9 3.6 - 11.0 K/uL   RBC 4.42 3.80 - 5.20 MIL/uL   Hemoglobin 13.2 12.0 - 16.0 g/dL   HCT 39.7 35.0 - 47.0 %   MCV 89.9 80.0 - 100.0 fL   MCH 29.9 26.0 - 34.0 pg   MCHC 33.2 32.0 - 36.0 g/dL   RDW 21.9 (H) 11.5 - 14.5 %   Platelets 165 150 - 440 K/uL   Neutrophils Relative % 61 %   Neutro Abs 3.0 1.4 - 6.5 K/uL   Lymphocytes Relative 29 %   Lymphs Abs 1.4 1.0 - 3.6 K/uL   Monocytes Relative 7 %   Monocytes Absolute 0.3 0.2 - 0.9 K/uL   Eosinophils Relative 2 %   Eosinophils Absolute 0.1 0 - 0.7 K/uL   Basophils Relative 1 %   Basophils Absolute 0.0 0 - 0.1 K/uL    Comment: Performed at [facility], [address] [place], [place] [address]  Iron and TIBC     Status: Abnormal   Collection Time: 05/16/17  1:38 PM  Result Value Ref Range   Iron 118 28 - 170 ug/dL   TIBC 340 250 - 450 ug/dL   Saturation Ratios 35 (H) 10.4 - 31.8 %   UIBC 222 ug/dL    Comment: Performed at [facility], [address] [place], [place] [address]  Ferritin     Status: None   Collection Time: 05/16/17  1:38 PM  Result Value Ref Range   Ferritin 28 11 - 307 ng/mL    Comment: Performed at  [facility], [address] [place],  [address]    Assessment/Plan:  Essential hypertension, benign blood pressure control important in reducing the progression of atherosclerotic disease. On appropriate oral medications.   Primary localized osteoarthritis of left knee Has had previous surgery for this  Anemia Has seen GI and hematology.  Not currently requiring any transfusion and report seems to be stable.  Pain in limb  Recommend:  The patient has atypical pain symptoms for pure atherosclerotic disease. However, on physical exam there is evidence of mixed venous and arterial disease, given the diminished pulses and the edema associated with venous changes of the legs.  Noninvasive studies including ABI's and venous ultrasound of the legs will be obtained and the patient will follow up with me to review these studies.  The patient should continue walking and begin a more formal exercise program. The patient should continue his antiplatelet therapy and aggressive treatment of the lipid abnormalities.  The patient should begin wearing graduated compression socks 15-20 mmHg strength to control edema.       Leotis Pain 08/01/2017, 1:37 PM   This note  was created with Dragon medical transcription system.  Any errors from dictation are unintentional.

## 2017-08-07 ENCOUNTER — Ambulatory Visit
Admission: RE | Admit: 2017-08-07 | Discharge: 2017-08-07 | Disposition: A | Discharge status: Home / Self Care | Payer: 59 | Source: Ambulatory Visit | Attending: Neurology | Admitting: Neurology

## 2017-08-07 DIAGNOSIS — M5126 Other intervertebral disc displacement, lumbar region: Secondary | ICD-10-CM | POA: Diagnosis not present

## 2017-08-07 DIAGNOSIS — M79605 Pain in left leg: Secondary | ICD-10-CM | POA: Diagnosis not present

## 2017-08-07 DIAGNOSIS — M1288 Other specific arthropathies, not elsewhere classified, other specified site: Secondary | ICD-10-CM | POA: Diagnosis not present

## 2017-08-07 DIAGNOSIS — M79604 Pain in right leg: Secondary | ICD-10-CM | POA: Diagnosis not present

## 2017-08-07 DIAGNOSIS — M4807 Spinal stenosis, lumbosacral region: Secondary | ICD-10-CM | POA: Diagnosis not present

## 2017-08-07 DIAGNOSIS — M545 Low back pain: Secondary | ICD-10-CM | POA: Diagnosis not present

## 2017-08-07 DIAGNOSIS — M5125 Other intervertebral disc displacement, thoracolumbar region: Secondary | ICD-10-CM | POA: Insufficient documentation

## 2017-08-07 DIAGNOSIS — M48061 Spinal stenosis, lumbar region without neurogenic claudication: Secondary | ICD-10-CM | POA: Insufficient documentation

## 2017-08-15 ENCOUNTER — Inpatient Hospital Stay: Payer: 59 | Attending: Oncology

## 2017-08-15 ENCOUNTER — Other Ambulatory Visit: Payer: Self-pay

## 2017-08-15 DIAGNOSIS — D509 Iron deficiency anemia, unspecified: Secondary | ICD-10-CM | POA: Diagnosis not present

## 2017-08-15 LAB — CBC WITH DIFFERENTIAL/PLATELET
Basophils Absolute: 0 10*3/uL (ref 0–0.1)
Basophils Relative: 1 %
Eosinophils Absolute: 0.1 10*3/uL (ref 0–0.7)
Eosinophils Relative: 3 %
HCT: 40.5 % (ref 35.0–47.0)
Hemoglobin: 13.3 g/dL (ref 12.0–16.0)
Lymphocytes Relative: 34 %
Lymphs Abs: 1.7 10*3/uL (ref 1.0–3.6)
MCH: 31 pg (ref 26.0–34.0)
MCHC: 32.9 g/dL (ref 32.0–36.0)
MCV: 94.2 fL (ref 80.0–100.0)
Monocytes Absolute: 0.3 10*3/uL (ref 0.2–0.9)
Monocytes Relative: 6 %
Neutro Abs: 2.8 10*3/uL (ref 1.4–6.5)
Neutrophils Relative %: 56 %
Platelets: 175 10*3/uL (ref 150–440)
RBC: 4.29 MIL/uL (ref 3.80–5.20)
RDW: 14 % (ref 11.5–14.5)
WBC: 5 10*3/uL (ref 3.6–11.0)

## 2017-08-15 LAB — IRON AND TIBC
Iron: 103 ug/dL (ref 28–170)
Saturation Ratios: 30 % (ref 10.4–31.8)
TIBC: 341 ug/dL (ref 250–450)
UIBC: 238 ug/dL

## 2017-08-15 LAB — FERRITIN: Ferritin: 23 ng/mL (ref 11–307)

## 2017-08-19 ENCOUNTER — Ambulatory Visit: Payer: 59 | Admitting: Gastroenterology

## 2017-08-19 ENCOUNTER — Encounter: Payer: Self-pay | Admitting: Gastroenterology

## 2017-08-19 ENCOUNTER — Other Ambulatory Visit: Payer: Self-pay

## 2017-09-09 DIAGNOSIS — H35013 Changes in retinal vascular appearance, bilateral: Secondary | ICD-10-CM | POA: Diagnosis not present

## 2017-09-09 DIAGNOSIS — H2513 Age-related nuclear cataract, bilateral: Secondary | ICD-10-CM | POA: Diagnosis not present

## 2017-09-09 DIAGNOSIS — H35033 Hypertensive retinopathy, bilateral: Secondary | ICD-10-CM | POA: Diagnosis not present

## 2017-09-17 ENCOUNTER — Ambulatory Visit (INDEPENDENT_AMBULATORY_CARE_PROVIDER_SITE_OTHER): Payer: Medicare HMO | Admitting: Internal Medicine

## 2017-09-17 VITALS — BP 140/82 | HR 66 | Temp 97.6°F | Resp 18 | Wt 236.0 lb

## 2017-09-17 DIAGNOSIS — E559 Vitamin D deficiency, unspecified: Secondary | ICD-10-CM | POA: Diagnosis not present

## 2017-09-17 DIAGNOSIS — Z1231 Encounter for screening mammogram for malignant neoplasm of breast: Secondary | ICD-10-CM | POA: Diagnosis not present

## 2017-09-17 DIAGNOSIS — D649 Anemia, unspecified: Secondary | ICD-10-CM | POA: Diagnosis not present

## 2017-09-17 DIAGNOSIS — R748 Abnormal levels of other serum enzymes: Secondary | ICD-10-CM

## 2017-09-17 DIAGNOSIS — R200 Anesthesia of skin: Secondary | ICD-10-CM

## 2017-09-17 DIAGNOSIS — E611 Iron deficiency: Secondary | ICD-10-CM | POA: Diagnosis not present

## 2017-09-17 DIAGNOSIS — Z1239 Encounter for other screening for malignant neoplasm of breast: Secondary | ICD-10-CM

## 2017-09-17 DIAGNOSIS — M79605 Pain in left leg: Secondary | ICD-10-CM | POA: Diagnosis not present

## 2017-09-17 DIAGNOSIS — M79604 Pain in right leg: Secondary | ICD-10-CM

## 2017-09-17 DIAGNOSIS — I10 Essential (primary) hypertension: Secondary | ICD-10-CM

## 2017-09-17 NOTE — Progress Notes (Signed)
[name], female   DOB: 09-30-1950, 67 y.o.   MRN: [id number]   Subjective:    [name], female    DOB: 1950/11/13, 67 y.o.   MRN: [id number]  HPI  Patient here for a scheduled follow up.  She has been having increased pain in her legs.  Seeing neurology.  Had MRI and NCS.  NCS - polyneuropathy.   MRI as outlined.  Scheduled to see Dr Sharlet Salina.  Also saw vascular surgery.  Planning for ABIs.  States other than her legs, she is doing well.  No chest pain.  No sob.  No acid reflux.  No abdominal pain.  Bowels moving. No urine change.     Past Medical History:  Diagnosis Date  . Anemia    not currently being treated for this  . Arthritis   . History of colon polyps   . Hypertension    Past Surgical History:  Procedure Laterality Date  . CHOLECYSTECTOMY  1989  . COLONOSCOPY WITH PROPOFOL N/A 04/10/2017   Procedure: COLONOSCOPY WITH PROPOFOL;  Surgeon: Lin Landsman, MD;  Location: [facility] ENDOSCOPY;  Service: Gastroenterology;  Laterality: N/A;  . ESOPHAGOGASTRODUODENOSCOPY (EGD) WITH PROPOFOL N/A 04/10/2017   Procedure: ESOPHAGOGASTRODUODENOSCOPY (EGD) WITH PROPOFOL;  Surgeon: Lin Landsman, MD;  Location: [facility] ENDOSCOPY;  Service: Gastroenterology;  Laterality: N/A;  . GASTRIC BYPASS  03/21/03  . JOINT REPLACEMENT Right 04/2014   TOTAL KNEE REPLACEMENT, DR. Rudene Christians, [place]  . TOTAL KNEE ARTHROPLASTY Left 02/27/2016   Procedure: TOTAL KNEE ARTHROPLASTY;  Surgeon: Hessie Knows, MD;  Location: [facility] ORS;  Service: Orthopedics;  Laterality: Left;  . TUBAL LIGATION  1976   Family History  Problem Relation Age of Onset  . Hypertension Mother   . Diabetes Mother   . Cancer Maternal Aunt        question of type  . Cancer Maternal Grandmother        colon  . Breast cancer Neg Hx    Social History   Socioeconomic History  . Marital status: Married    Spouse name: Not on file  . Number of children: 2  . Years of education: Not on file  . Highest  education level: Not on file  Occupational History    Employer: [employer]  . Financial resource strain: Not on file  . Food insecurity:    Worry: Not on file    Inability: Not on file  . Transportation needs:    Medical: Not on file    Non-medical: Not on file  Tobacco Use  . Smoking status: Never Smoker  . Smokeless tobacco: Never Used  Substance and Sexual Activity  . Alcohol use: No    Alcohol/week: 0.0 standard drinks  . Drug use: No  . Sexual activity: Yes  Lifestyle  . Physical activity:    Days per week: Not on file    Minutes per session: Not on file  . Stress: Not on file  Relationships  . Social connections:    Talks on phone: Not on file    Gets together: Not on file    Attends religious service: Not on file    Active member of club or organization: Not on file    Attends meetings of clubs or organizations: Not on file    Relationship status: Not on file  Other Topics Concern  . Not on file  Social History Narrative  . Not on file    Outpatient Encounter  Medications as of 09/17/2017  Medication Sig  . [DISCONTINUED] lisinopril (PRINIVIL,ZESTRIL) 10 MG tablet Take 1 tablet (10 mg total) by mouth daily.  . [DISCONTINUED] LORazepam (ATIVAN) 1 MG tablet    No facility-administered encounter medications on file as of 09/17/2017.     Review of Systems  Constitutional: Negative for appetite change and unexpected weight change.  HENT: Negative for congestion and sinus pressure.   Respiratory: Negative for cough, chest tightness and shortness of breath.   Cardiovascular: Negative for chest pain, palpitations and leg swelling.  Gastrointestinal: Negative for abdominal pain, diarrhea, nausea and vomiting.  Genitourinary: Negative for difficulty urinating and dysuria.  Musculoskeletal: Negative for myalgias.       Bilateral leg and knee pain as outlined.    Skin: Negative for color change and rash.  Neurological: Negative for dizziness, light-headedness  and headaches.  Psychiatric/Behavioral: Negative for agitation and dysphoric mood.       Objective:    Physical Exam  Constitutional: She appears well-developed and well-nourished. No distress.  HENT:  Nose: Nose normal.  Mouth/Throat: Oropharynx is clear and moist.  Neck: Neck supple. No thyromegaly present.  Cardiovascular: Normal rate and regular rhythm.  Pulmonary/Chest: Breath sounds normal. No respiratory distress. She has no wheezes.  Abdominal: Soft. Bowel sounds are normal. There is no tenderness.  Musculoskeletal: She exhibits no edema or tenderness.  Lymphadenopathy:    She has no cervical adenopathy.  Skin: No rash noted. No erythema.  Psychiatric: She has a normal mood and affect. Her behavior is normal.    BP 140/82 (BP Location: Left Arm, Patient Position: Sitting, Cuff Size: Large)   Pulse 66   Temp 97.6 F (36.4 C) (Oral)   Resp 18   Wt 236 lb (107 kg)   LMP 04/06/2009   SpO2 98%   BMI 41.81 kg/m  Wt Readings from Last 3 Encounters:  09/17/17 236 lb (107 kg)  08/01/17 231 lb 6.4 oz (105 kg)  05/06/17 231 lb 12.8 oz (105.1 kg)     Lab Results  Component Value Date   WBC 5.0 08/15/2017   HGB 13.3 08/15/2017   HCT 40.5 08/15/2017   PLT 175 08/15/2017   GLUCOSE 175 (H) 03/24/2017   CHOL 104 07/23/2016   TRIG 54.0 07/23/2016   HDL 50.40 07/23/2016   LDLCALC 43 07/23/2016   ALT 22 03/24/2017   AST 35 03/24/2017   NA 139 03/24/2017   K 4.1 03/24/2017   CL 110 03/24/2017   CREATININE 0.73 03/24/2017   BUN 15 03/24/2017   CO2 18 (L) 03/24/2017   TSH 0.42 02/26/2017   INR 1.14 02/21/2016   HGBA1C 5.9 02/26/2017    Mr Lumbar Spine Wo Contrast  Result Date: 08/08/2017 CLINICAL DATA:  Low back pain when standing.  Bilateral leg numbness EXAM: MRI LUMBAR SPINE WITHOUT CONTRAST TECHNIQUE: Multiplanar, multisequence MR imaging of the lumbar spine was performed. No intravenous contrast was administered. COMPARISON:  None. FINDINGS: Segmentation:  5  lumbar type vertebral bodies. Alignment: 2 mm anterolisthesis L3-4. 3 mm anterolisthesis L4-5. 4 mm anterolisthesis L5-S1. Vertebrae:  No fracture or primary bone lesion. Conus medullaris and cauda equina: Conus extends to the L1 level. Conus and cauda equina appear normal. Paraspinal and other soft tissues: Negative Disc levels: T12-L1: Broad-based disc herniation more prominent towards the left with slight caudal down turning. This narrows the spinal canal, effaces the subarachnoid space and has potential to have some compressive effect upon the distal cord/conus. L1-2: Mild disc bulge.  No compressive stenosis. L2-3: Mild disc bulge.  No compressive stenosis. L3-4: Bilateral facet arthropathy with 2 mm of anterolisthesis. Bulging of the disc. Stenosis of the lateral recesses and neural foramina which could possibly be symptomatic. L4-5: Bilateral facet arthropathy with 3 mm of anterolisthesis. Bulging of the disc. Stenosis of the lateral recesses and neural foramina that could cause neural compression. L5-S1: Bilateral facet arthropathy with 4 mm of anterolisthesis. Shallow protrusion of the disc. Stenosis of the subarticular lateral recesses and neural foramina that could cause neural compression on either or both sides. IMPRESSION: T12-L1: Broad-based disc herniation slightly more prominent towards the left, with slight caudal down turning. Spinal stenosis with effacement the subarachnoid space. Some potential for neural compression affecting the distal cord/conus and nerve roots of the cauda equina. L3-4: Facet arthropathy with 2 mm of anterolisthesis. Bulging of the disc. Mild stenosis of the lateral recesses and foramina. L4-5: Facet arthropathy with 3 mm of anterolisthesis. Bulging of the disc. Stenosis of the lateral recesses and foramina that could cause neural compression on either or both sides. L5-S1: Facet arthropathy with 4 mm of anterolisthesis. Shallow protrusion of the disc. Bilateral subarticular  lateral recess and foraminal stenosis that could cause neural compression. Particularly, the exiting L5 nerves are at risk. Electronically Signed   By: Nelson Chimes M.D.   On: 08/08/2017 08:31       Assessment & Plan:   Problem List Items Addressed This Visit    Anemia    Has seen GI and hematology.  Follow cbc.        Bilateral leg numbness    Is B12 deficient.  Continue B12 injections.  Had NCS.  Polyneuropathy.  Continue f/u with neurology.        Elevated alkaline phosphatase level    Have discussed with hematology.  Has been stable.  No further w/up warranted.        Essential hypertension, benign    Blood pressure elevated.  Not taking her lisinopril regularly.  Misses multiple doses per week.  Discussed again the importance of taking her medication regularly.  Follow pressures.  Follow metabolic panel.        Iron deficiency    Has seen hematology.  Has required iron infusions.  Follow cbc.       Leg pain, bilateral    Persistent leg pain. MRI as outlined.  Scheduled to see Dr Sharlet Salina.  Follow.        Vitamin D deficiency    Replace vitamin D.         Other Visit Diagnoses    Breast cancer screening    -  Primary   Relevant Orders   MM 3D SCREEN BREAST BILATERAL       Einar Pheasant, MD

## 2017-09-17 NOTE — Patient Instructions (Signed)
Dr Manuella Ghazi - gave instructions to take the prescription vitamin D once a week for 8 weeks and then start vitamin D3 1000 units per day (after complete the prescription).

## 2017-09-19 ENCOUNTER — Telehealth: Payer: Self-pay | Admitting: Internal Medicine

## 2017-09-19 DIAGNOSIS — M25562 Pain in left knee: Principal | ICD-10-CM

## 2017-09-19 DIAGNOSIS — G8929 Other chronic pain: Secondary | ICD-10-CM

## 2017-09-19 DIAGNOSIS — M25561 Pain in right knee: Principal | ICD-10-CM

## 2017-09-19 MED ORDER — LISINOPRIL 10 MG PO TABS: 10.0000 mg | ORAL_TABLET | Freq: Every day | ORAL | 0 refills | Status: DC

## 2017-09-19 NOTE — Telephone Encounter (Signed)
Lisinopril 10mg  refill Last Refill:02/26/17 # 90 Last OV:02/26/17  BVA:POLID, Charlene Md Pharmacy:[facility]/[place]

## 2017-09-19 NOTE — Telephone Encounter (Signed)
Copied from [place] [id number]. Topic: Quick Communication - Rx Refill/Question >> Sep 19, 2017  2:01 PM Reyne Dumas L wrote: Medication: lisinopril (PRINIVIL,ZESTRIL) 10 MG tablet  Has the patient contacted their pharmacy? Yes - they state no RX there.  Pt states this should have been called in after OV (Agent: If no, request that the patient contact the pharmacy for the refill.) (Agent: If yes, when and what did the pharmacy advise?)  Preferred Pharmacy (with phone number or street name): [place], [place] [phone] (Phone) [phone] (Fax)  Agent: Please be advised that RX refills may take up to 3 business days. We ask that you follow-up with your pharmacy.

## 2017-09-22 ENCOUNTER — Encounter: Payer: Self-pay | Admitting: Internal Medicine

## 2017-09-22 DIAGNOSIS — E559 Vitamin D deficiency, unspecified: Secondary | ICD-10-CM | POA: Insufficient documentation

## 2017-09-22 DIAGNOSIS — M79604 Pain in right leg: Secondary | ICD-10-CM | POA: Insufficient documentation

## 2017-09-22 DIAGNOSIS — M79605 Pain in left leg: Secondary | ICD-10-CM | POA: Insufficient documentation

## 2017-09-22 NOTE — Assessment & Plan Note (Signed)
Has seen GI and hematology.  Follow cbc.

## 2017-09-22 NOTE — Assessment & Plan Note (Signed)
Blood pressure elevated.  Not taking her lisinopril regularly.  Misses multiple doses per week.  Discussed again the importance of taking her medication regularly.  Follow pressures.  Follow metabolic panel.

## 2017-09-22 NOTE — Assessment & Plan Note (Signed)
Have discussed with hematology.  Has been stable.  No further w/up warranted.

## 2017-09-22 NOTE — Assessment & Plan Note (Signed)
Is B12 deficient.  Continue B12 injections.  Had NCS.  Polyneuropathy.  Continue f/u with neurology.

## 2017-09-22 NOTE — Assessment & Plan Note (Signed)
Persistent leg pain. MRI as outlined.  Scheduled to see Dr Sharlet Salina.  Follow.

## 2017-09-22 NOTE — Assessment & Plan Note (Signed)
Replace vitamin D.

## 2017-09-22 NOTE — Assessment & Plan Note (Signed)
Has seen hematology.  Has required iron infusions.  Follow cbc.

## 2017-09-24 ENCOUNTER — Encounter (INDEPENDENT_AMBULATORY_CARE_PROVIDER_SITE_OTHER): Payer: Self-pay | Admitting: Nurse Practitioner

## 2017-09-24 ENCOUNTER — Ambulatory Visit (INDEPENDENT_AMBULATORY_CARE_PROVIDER_SITE_OTHER): Payer: Medicare HMO | Admitting: Nurse Practitioner

## 2017-09-24 ENCOUNTER — Ambulatory Visit (INDEPENDENT_AMBULATORY_CARE_PROVIDER_SITE_OTHER): Payer: Medicare HMO

## 2017-09-24 VITALS — BP 186/81 | HR 86 | Resp 16 | Ht 63.0 in | Wt 232.0 lb

## 2017-09-24 DIAGNOSIS — M79604 Pain in right leg: Secondary | ICD-10-CM

## 2017-09-24 DIAGNOSIS — I10 Essential (primary) hypertension: Secondary | ICD-10-CM | POA: Diagnosis not present

## 2017-09-24 DIAGNOSIS — I872 Venous insufficiency (chronic) (peripheral): Secondary | ICD-10-CM | POA: Diagnosis not present

## 2017-09-24 DIAGNOSIS — M79605 Pain in left leg: Secondary | ICD-10-CM

## 2017-09-24 DIAGNOSIS — R200 Anesthesia of skin: Secondary | ICD-10-CM

## 2017-09-24 NOTE — Progress Notes (Signed)
Subjective:    [name], female    DOB: 1950/11/16, 67 y.o.   MRN: [id number] Chief Complaint  Patient presents with  . Follow-up    Reflux U/S follow up    HPI  Kelly Wallace is a 67 y.o. female who presents with numbness and weakness of her bilateral lower extremities.  Patient states that this numbness and weakness has been going on for some time.  She states that she does not have a cramping and claudication-like pain, however a sharp shooting pain that originates from her buttocks down her leg.  She states that she also has swelling of the lower extremities, however it is not a significant cause of distress compared to the weakness and numbness of her legs.  She denies any fever, chills, nausea, vomiting, diarrhea.  The patient denies any rest pain, ulcerations, claudication-like symptoms.  She denies any chest pain or shortness of breath.  The patient underwent an MRI on 7/18/20219.  This revealed multiple levels of spinal stenosis.  Today she underwent an ABI as well as a bilateral lower venous reflux study.  The right ABI was 1.17 with a TBI of 1.11, the left ABI was 1.11 with a noncompressible TBI.  The patient also underwent a lower venous reflux study which revealed no evidence of DVT in either lower extremity.  There is no evidence of superficial venous thrombosis in either lower extremity there is reflux greater than 500 in the common femoral vein.  There is evidence of reflux in the common femoral vein, saphenofemoral junction, great saphenous vein, popliteal vein and small saphenous vein of the right lower extremity.  Constitutional: [] Weight loss  [] Fever  [] Chills Cardiac: [] Chest pain   [] Chest pressure   [] Palpitations   [] Shortness of breath when laying flat   [] Shortness of breath with exertion. Vascular:  [x] Pain in legs with walking   [] Pain in legs with standing  [] History of DVT   [] Phlebitis   [] Swelling in legs   [] Varicose veins   [] Non-healing  ulcers Pulmonary:   [] Uses home oxygen   [] Productive cough   [] Hemoptysis   [] Wheeze  [] COPD   [] Asthma Neurologic:  [] Dizziness   [] Seizures   [] History of stroke   [] History of TIA  [] Aphasia   [] Vissual changes   [x] Weakness or numbness in arm   [x] Weakness or numbness in leg Musculoskeletal:   [] Joint swelling   [] Joint pain   [x] Low back pain Hematologic:  [] Easy bruising  [] Easy bleeding   [] Hypercoagulable state   [] Anemic Gastrointestinal:  [] Diarrhea   [] Vomiting  [] Gastroesophageal reflux/heartburn   [] Difficulty swallowing. Genitourinary:  [] Chronic kidney disease   [] Difficult urination  [] Frequent urination   [] Blood in urine Skin:  [] Rashes   [] Ulcers  Psychological:  [] History of anxiety   []  History of major depression.     Objective:   Physical Exam  BP (!) 186/81 (BP Location: Right Arm, Patient Position: Sitting)   Pulse 86   Resp 16   Ht 5\' 3"  (1.6 m)   Wt 232 lb (105.2 kg)   LMP 04/06/2009   BMI 41.10 kg/m   Past Medical History:  Diagnosis Date  . Anemia    not currently being treated for this  . Arthritis   . History of colon polyps   . Hypertension      Gen: WD/WN, NAD Head: [place]/AT, No temporalis wasting.  Ear/Nose/Throat: Hearing grossly intact, nares w/o erythema or drainage Eyes: PER, EOMI, sclera nonicteric.  Neck: Supple, no masses.  No JVD.  Pulmonary:  Good air movement, no use of accessory muscles.  Cardiac: RRR Vascular:  Vessel Right Left  Radial  able to palpate  able to palpate  DP  hard to palpate  hard to palpate  Gastrointestinal: soft, non-distended. No guarding/no peritoneal signs.  Musculoskeletal:   No deformity or atrophy.  Neurologic: Pain and light touch intact in extremities.  Symmetrical.  Speech is fluent. Motor exam as listed above. Psychiatric: Judgment intact, Mood & affect appropriate for pt's clinical situation. Dermatologic: No Venous rashes. No Ulcers Noted.  No changes consistent with cellulitis. Lymph : No  Cervical lymphadenopathy, no lichenification or skin changes of chronic lymphedema.   Social History   Socioeconomic History  . Marital status: Married    Spouse name: Not on file  . Number of children: 2  . Years of education: Not on file  . Highest education level: Not on file  Occupational History    Employer: [employer]  . Financial resource strain: Not on file  . Food insecurity:    Worry: Not on file    Inability: Not on file  . Transportation needs:    Medical: Not on file    Non-medical: Not on file  Tobacco Use  . Smoking status: Never Smoker  . Smokeless tobacco: Never Used  Substance and Sexual Activity  . Alcohol use: No    Alcohol/week: 0.0 standard drinks  . Drug use: No  . Sexual activity: Yes  Lifestyle  . Physical activity:    Days per week: Not on file    Minutes per session: Not on file  . Stress: Not on file  Relationships  . Social connections:    Talks on phone: Not on file    Gets together: Not on file    Attends religious service: Not on file    Active member of club or organization: Not on file    Attends meetings of clubs or organizations: Not on file    Relationship status: Not on file  . Intimate partner violence:    Fear of current or ex partner: Not on file    Emotionally abused: Not on file    Physically abused: Not on file    Forced sexual activity: Not on file  Other Topics Concern  . Not on file  Social History Narrative  . Not on file    Past Surgical History:  Procedure Laterality Date  . CHOLECYSTECTOMY  1989  . COLONOSCOPY WITH PROPOFOL N/A 04/10/2017   Procedure: COLONOSCOPY WITH PROPOFOL;  Surgeon: Lin Landsman, MD;  Location: [facility] ENDOSCOPY;  Service: Gastroenterology;  Laterality: N/A;  . ESOPHAGOGASTRODUODENOSCOPY (EGD) WITH PROPOFOL N/A 04/10/2017   Procedure: ESOPHAGOGASTRODUODENOSCOPY (EGD) WITH PROPOFOL;  Surgeon: Lin Landsman, MD;  Location: [facility] ENDOSCOPY;  Service: Gastroenterology;   Laterality: N/A;  . GASTRIC BYPASS  03/21/03  . JOINT REPLACEMENT Right 04/2014   TOTAL KNEE REPLACEMENT, DR. Rudene Christians, [place]  . TOTAL KNEE ARTHROPLASTY Left 02/27/2016   Procedure: TOTAL KNEE ARTHROPLASTY;  Surgeon: Hessie Knows, MD;  Location: [facility] ORS;  Service: Orthopedics;  Laterality: Left;  . TUBAL LIGATION  1976    Family History  Problem Relation Age of Onset  . Hypertension Mother   . Diabetes Mother   . Cancer Maternal Aunt        question of type  . Cancer Maternal Grandmother        colon  . Breast cancer Neg Hx  No Known Allergies     Assessment & Plan:   1. Bilateral leg numbness The patient does not have ABIs consistent with what is typically seen with arterial disease which would cause numbness throughout the lower extremities she had biphasic and triphasic flows throughout her bilateral lower extremities.  The digit waveforms were slightly dampened but not absent.  The patient also does not describe pain which is consistent with arterial disease.  The patient describes pain that is more so consistent with sciatica or spinal stenosis.   he patient's recent MRI suggests that her pain is likely due to degenerative joint disease of the spine.  I have recommended with the patient that she follow-up with her neurologist.  2. Essential hypertension, benign Continue antihypertensive medications as already ordered, these medications have been reviewed and there are no changes at this time.   3. Chronic venous insufficiency  The patient also underwent a lower venous reflux study which revealed no evidence of DVT in either lower extremity.  There is no evidence of superficial venous thrombosis in either lower extremity there is reflux greater than 500 in the common femoral vein.  There is evidence of reflux in the common femoral vein, saphenofemoral junction, great saphenous vein, popliteal vein and small saphenous vein of the right lower extremity.  I have had a long  discussion with the patient regarding swelling and why it  causes symptoms.  Patient will begin wearing graduated compression stockings class 1 (20-30 mmHg) on a daily basis a prescription was given. The patient will  beginning wearing the stockings first thing in the morning and removing them in the evening. The patient is instructed specifically not to sleep in the stockings.   In addition, behavioral modification will be initiated.  This will include frequent elevation, use of over the counter pain medications and exercise such as walking.  I have reviewed systemic causes for chronic edema such as liver, kidney and cardiac etiologies.  The patient denies problems with these organ systems.    Consideration for a lymph pump will also be made based upon the effectiveness of conservative therapy.  This would help to improve the edema control and prevent sequela such as ulcers and infections   The patient does not believe that at this time her's swelling is as big of a focus as her leg numbness.  She will follow-up as needed for swelling if it becomes problematic after utilizing conservative therapy.   Current Outpatient Medications on File Prior to Visit  Medication Sig Dispense Refill  . lisinopril (PRINIVIL,ZESTRIL) 10 MG tablet Take 1 tablet (10 mg total) by mouth daily. 90 tablet 0   No current facility-administered medications on file prior to visit.     There are no Patient Instructions on file for this visit. No follow-ups on file.   Kris Hartmann, NP

## 2017-10-17 ENCOUNTER — Encounter: Payer: Self-pay | Admitting: Internal Medicine

## 2017-11-13 ENCOUNTER — Ambulatory Visit
Admission: RE | Admit: 2017-11-13 | Discharge: 2017-11-13 | Disposition: A | Discharge status: Home / Self Care | Payer: Medicare HMO | Source: Ambulatory Visit | Attending: Internal Medicine | Admitting: Internal Medicine

## 2017-11-13 DIAGNOSIS — Z1231 Encounter for screening mammogram for malignant neoplasm of breast: Secondary | ICD-10-CM | POA: Diagnosis not present

## 2017-11-13 DIAGNOSIS — Z1239 Encounter for other screening for malignant neoplasm of breast: Secondary | ICD-10-CM

## 2017-11-14 ENCOUNTER — Encounter: Payer: Self-pay | Admitting: Oncology

## 2017-11-14 ENCOUNTER — Inpatient Hospital Stay: Payer: Medicare HMO | Attending: Oncology | Admitting: Oncology

## 2017-11-14 ENCOUNTER — Inpatient Hospital Stay: Payer: Medicare HMO

## 2017-11-14 VITALS — BP 145/93 | HR 75 | Temp 97.2°F | Resp 18 | Ht 63.0 in | Wt 235.0 lb

## 2017-11-14 DIAGNOSIS — D509 Iron deficiency anemia, unspecified: Secondary | ICD-10-CM

## 2017-11-14 LAB — CBC WITH DIFFERENTIAL/PLATELET
Abs Immature Granulocytes: 0.01 10*3/uL (ref 0.00–0.07)
Basophils Absolute: 0 10*3/uL (ref 0.0–0.1)
Basophils Relative: 1 %
Eosinophils Absolute: 0.2 10*3/uL (ref 0.0–0.5)
Eosinophils Relative: 3 %
HCT: 40.5 % (ref 36.0–46.0)
Hemoglobin: 13.4 g/dL (ref 12.0–15.0)
Immature Granulocytes: 0 %
Lymphocytes Relative: 37 %
Lymphs Abs: 2 10*3/uL (ref 0.7–4.0)
MCH: 30.9 pg (ref 26.0–34.0)
MCHC: 33.1 g/dL (ref 30.0–36.0)
MCV: 93.5 fL (ref 80.0–100.0)
Monocytes Absolute: 0.4 10*3/uL (ref 0.1–1.0)
Monocytes Relative: 6 %
Neutro Abs: 2.9 10*3/uL (ref 1.7–7.7)
Neutrophils Relative %: 53 %
Platelets: 192 10*3/uL (ref 150–400)
RBC: 4.33 MIL/uL (ref 3.87–5.11)
RDW: 13.1 % (ref 11.5–15.5)
WBC: 5.5 10*3/uL (ref 4.0–10.5)
nRBC: 0 % (ref 0.0–0.2)

## 2017-11-14 LAB — IRON AND TIBC
Iron: 92 ug/dL (ref 28–170)
Saturation Ratios: 26 % (ref 10.4–31.8)
TIBC: 351 ug/dL (ref 250–450)
UIBC: 259 ug/dL

## 2017-11-14 LAB — FERRITIN: Ferritin: 29 ng/mL (ref 11–307)

## 2017-11-14 NOTE — Progress Notes (Signed)
Sched for [employer] weekly x 2. Appts schd and conf with patient.   Kelly Wallace

## 2017-11-14 NOTE — Addendum Note (Signed)
Addended by: Randa Evens C on: 11/14/2017 12:34 PM   Modules accepted: Orders

## 2017-11-14 NOTE — Progress Notes (Signed)
Hematology/Oncology Consult note [facility]  [phone] [phone]  Patient Care Team: Einar Pheasant, MD as PCP - General (Internal Medicine)   Name of the patient: Kelly Wallace  [id number]  January 13, 1951   Date of visit: 11/14/17  Diagnosis-  iron deficiency anemia likely due to gastric bypass  Chief complaint/ Reason for visit-routine follow-up of iron deficiency anemia  Heme/Onc history: patient is a 67 year oldobese African-Americanfemale who has been referred to [place] for evaluation and management of anemia. She has a history of gastric bypass surgery back in 2005. She is not currently taking any iron supplements. Reports possible history of colon cancer in her maternal grandmother but she is not sure. She denies any bleeding in her stool or urine. Denies any gum bleeds and nosebleeds. She uses Aleve occasionally maybe once in a few months for headaches but not consistently. Denies any dark melanotic stools. Denies any loss of appetite or unintentional weight loss. She is postmenopausal  Recent CBC from 02/26/2017 showed white count of 5, H&H of 9.5/30.4 with an MCV of 78.7 and a platelet count of 232. Ferritin levels were low at 4.9. Of note ferritin was low at 7.45 months ago as well.B12 levels were low at 204. TSH was normal. On review of her prior CBCs her hemoglobin has been between 9-10 for the last 1 year. Borderline low MCV levels.No other cytopenias. Last colonoscopy in June 2013 showed internal hemorrhoids but no other abnormal findings. pateint had repeat EGD and colonoscopy inmarch 2019 which did not reveal any bleeding.s he received 2 doses of feraheme in march 2019  Interval history-she reports doing well.  Her appetite is good and she denies any unintentional weight loss.  Energy levels are good.  She has not noticed any blood in her stool or urine.  Denies any dark melanotic stools.  ECOG PS- 1 Pain scale-  0   Review of systems- Review of Systems  Constitutional: Negative for chills, fever, malaise/fatigue and weight loss.  HENT: Negative for congestion, ear discharge and nosebleeds.   Eyes: Negative for blurred vision.  Respiratory: Negative for cough, hemoptysis, sputum production, shortness of breath and wheezing.   Cardiovascular: Negative for chest pain, palpitations, orthopnea and claudication.  Gastrointestinal: Negative for abdominal pain, blood in stool, constipation, diarrhea, heartburn, melena, nausea and vomiting.  Genitourinary: Negative for dysuria, flank pain, frequency, hematuria and urgency.  Musculoskeletal: Negative for back pain, joint pain and myalgias.  Skin: Negative for rash.  Neurological: Negative for dizziness, tingling, focal weakness, seizures, weakness and headaches.  Endo/Heme/Allergies: Does not bruise/bleed easily.  Psychiatric/Behavioral: Negative for depression and suicidal ideas. The patient does not have insomnia.       No Known Allergies   Past Medical History:  Diagnosis Date  . Anemia    not currently being treated for this  . Arthritis   . History of colon polyps   . Hypertension      Past Surgical History:  Procedure Laterality Date  . CHOLECYSTECTOMY  1989  . COLONOSCOPY WITH PROPOFOL N/A 04/10/2017   Procedure: COLONOSCOPY WITH PROPOFOL;  Surgeon: Lin Landsman, MD;  Location: [facility] ENDOSCOPY;  Service: Gastroenterology;  Laterality: N/A;  . ESOPHAGOGASTRODUODENOSCOPY (EGD) WITH PROPOFOL N/A 04/10/2017   Procedure: ESOPHAGOGASTRODUODENOSCOPY (EGD) WITH PROPOFOL;  Surgeon: Lin Landsman, MD;  Location: [facility] ENDOSCOPY;  Service: Gastroenterology;  Laterality: N/A;  . GASTRIC BYPASS  03/21/03  . JOINT REPLACEMENT Right 04/2014   TOTAL KNEE REPLACEMENT, DR. Rudene Christians, [place]  .  TOTAL KNEE ARTHROPLASTY Left 02/27/2016   Procedure: TOTAL KNEE ARTHROPLASTY;  Surgeon: Hessie Knows, MD;  Location: [facility] ORS;  Service: Orthopedics;  Laterality:  Left;  . TUBAL LIGATION  1976    Social History   Socioeconomic History  . Marital status: Married    Spouse name: Not on file  . Number of children: 2  . Years of education: Not on file  . Highest education level: Not on file  Occupational History    Employer: [employer]  . Financial resource strain: Not on file  . Food insecurity:    Worry: Not on file    Inability: Not on file  . Transportation needs:    Medical: Not on file    Non-medical: Not on file  Tobacco Use  . Smoking status: Never Smoker  . Smokeless tobacco: Never Used  Substance and Sexual Activity  . Alcohol use: No    Alcohol/week: 0.0 standard drinks  . Drug use: No  . Sexual activity: Yes  Lifestyle  . Physical activity:    Days per week: Not on file    Minutes per session: Not on file  . Stress: Not on file  Relationships  . Social connections:    Talks on phone: Not on file    Gets together: Not on file    Attends religious service: Not on file    Active member of club or organization: Not on file    Attends meetings of clubs or organizations: Not on file    Relationship status: Not on file  . Intimate partner violence:    Fear of current or ex partner: Not on file    Emotionally abused: Not on file    Physically abused: Not on file    Forced sexual activity: Not on file  Other Topics Concern  . Not on file  Social History Narrative  . Not on file    Family History  Problem Relation Age of Onset  . Hypertension Mother   . Diabetes Mother   . Cancer Maternal Aunt        question of type  . Cancer Maternal Grandmother        colon  . Breast cancer Neg Hx      Current Outpatient Medications:  .  lisinopril (PRINIVIL,ZESTRIL) 10 MG tablet, Take 1 tablet (10 mg total) by mouth daily., Disp: 90 tablet, Rfl: 0  Physical exam:  Vitals:   11/14/17 1030  BP: (!) 145/93  Pulse: 75  Resp: 18  Temp: (!) 97.2 F (36.2 C)  TempSrc: Tympanic  SpO2: 98%  Weight: 235 lb (106.6  kg)  Height: 5\' 3"  (1.6 m)   Physical Exam  Constitutional: She is oriented to person, place, and time. She appears well-developed and well-nourished.  HENT:  Head: Normocephalic and atraumatic.  Eyes: Pupils are equal, round, and reactive to light. EOM are normal.  Neck: Normal range of motion.  Cardiovascular: Normal rate, regular rhythm and normal heart sounds.  Pulmonary/Chest: Effort normal and breath sounds normal.  Abdominal: Soft. Bowel sounds are normal.  Neurological: She is alert and oriented to person, place, and time.  Skin: Skin is warm and dry.     CMP Latest Ref Rng & Units 03/24/2017  Glucose 65 - 99 mg/dL 175(H)  BUN 6 - 20 mg/dL 15  Creatinine 0.44 - 1.00 mg/dL 0.73  Sodium 135 - 145 mmol/L 139  Potassium 3.5 - 5.1 mmol/L 4.1  Chloride 101 - 111 mmol/L 110  CO2 22 - 32 mmol/L 18(L)  Calcium 8.9 - 10.3 mg/dL 8.7(L)  Total Protein 6.5 - 8.1 g/dL 8.3(H)  Total Bilirubin 0.3 - 1.2 mg/dL 0.6  Alkaline Phos 38 - 126 U/L 204(H)  AST 15 - 41 U/L 35  ALT 14 - 54 U/L 22   CBC Latest Ref Rng & Units 11/14/2017  WBC 4.0 - 10.5 K/uL 5.5  Hemoglobin 12.0 - 15.0 g/dL 13.4  Hematocrit 36.0 - 46.0 % 40.5  Platelets 150 - 400 K/uL 192    No images are attached to the encounter.  Mm 3d Screen Breast Bilateral  Result Date: 11/13/2017 CLINICAL DATA:  Screening. EXAM: DIGITAL SCREENING BILATERAL MAMMOGRAM WITH TOMO AND CAD COMPARISON:  Previous exam(s). ACR Breast Density Category b: There are scattered areas of fibroglandular density. FINDINGS: There are no findings suspicious for malignancy. Images were processed with CAD. IMPRESSION: No mammographic evidence of malignancy. A result letter of this screening mammogram will be mailed directly to the patient. RECOMMENDATION: Screening mammogram in one year. (Code:SM-B-01Y) BI-RADS CATEGORY  1: Negative. Electronically Signed   By: Everlean Alstrom M.D.   On: 11/13/2017 16:09     Assessment and plan- Patient is a 67 y.o.  female with iron deficiency anemia likely secondary to gastric bypass  Patient's hemoglobin is remained stable around 13 over the last 6 months.  Iron studies from today are pending however the last couple of ferritin levels have been low in the 20s.  If her ferritin comes back quickly less than 50 I will consider giving her 2 doses of Feraheme.  We will call the patient with the results of the test.  Repeat CBC ferritin and iron studies in 3 in 6 months and I will see her back in 6 months.  We will check a B12 level in 3 months   Visit Diagnosis 1. Iron deficiency anemia, unspecified iron deficiency anemia type      Dr. Randa Evens, MD, MPH [facility] [phone] 11/14/2017 11:17 AM

## 2017-11-14 NOTE — Progress Notes (Signed)
No new changes noted today 

## 2017-11-21 ENCOUNTER — Inpatient Hospital Stay: Payer: Medicare HMO | Attending: Oncology

## 2017-11-21 VITALS — BP 146/89 | HR 75 | Temp 95.6°F | Resp 19

## 2017-11-21 DIAGNOSIS — E611 Iron deficiency: Secondary | ICD-10-CM

## 2017-11-21 DIAGNOSIS — D509 Iron deficiency anemia, unspecified: Secondary | ICD-10-CM | POA: Insufficient documentation

## 2017-11-21 MED ORDER — SODIUM CHLORIDE 0.9 % IV SOLN
510.0000 mg | Freq: Once | INTRAVENOUS | Status: AC
  Administered 2017-11-21: 510 mg via INTRAVENOUS
  Filled 2017-11-21: qty 17

## 2017-11-21 MED ORDER — SODIUM CHLORIDE 0.9 % IV SOLN
Freq: Once | INTRAVENOUS | Status: AC
  Administered 2017-11-21: 10:00:00 via INTRAVENOUS
  Filled 2017-11-21: qty 250

## 2017-11-26 ENCOUNTER — Ambulatory Visit: Payer: Medicare HMO | Admitting: Internal Medicine

## 2017-11-28 ENCOUNTER — Inpatient Hospital Stay: Payer: Medicare HMO

## 2017-11-28 VITALS — BP 144/86 | HR 72 | Temp 97.0°F | Resp 20

## 2017-11-28 DIAGNOSIS — E611 Iron deficiency: Secondary | ICD-10-CM

## 2017-11-28 DIAGNOSIS — D509 Iron deficiency anemia, unspecified: Secondary | ICD-10-CM | POA: Diagnosis not present

## 2017-11-28 MED ORDER — SODIUM CHLORIDE 0.9 % IV SOLN
510.0000 mg | Freq: Once | INTRAVENOUS | Status: AC
  Administered 2017-11-28: 510 mg via INTRAVENOUS
  Filled 2017-11-28: qty 17

## 2017-11-28 MED ORDER — SODIUM CHLORIDE 0.9 % IV SOLN
Freq: Once | INTRAVENOUS | Status: AC
  Administered 2017-11-28: 14:00:00 via INTRAVENOUS
  Filled 2017-11-28: qty 250

## 2018-01-06 DIAGNOSIS — E538 Deficiency of other specified B group vitamins: Secondary | ICD-10-CM | POA: Diagnosis not present

## 2018-01-06 DIAGNOSIS — I10 Essential (primary) hypertension: Secondary | ICD-10-CM | POA: Diagnosis not present

## 2018-01-06 DIAGNOSIS — M48061 Spinal stenosis, lumbar region without neurogenic claudication: Secondary | ICD-10-CM | POA: Diagnosis not present

## 2018-01-19 DIAGNOSIS — E538 Deficiency of other specified B group vitamins: Secondary | ICD-10-CM | POA: Diagnosis not present

## 2018-01-26 DIAGNOSIS — E538 Deficiency of other specified B group vitamins: Secondary | ICD-10-CM | POA: Diagnosis not present

## 2018-01-27 DIAGNOSIS — R200 Anesthesia of skin: Secondary | ICD-10-CM | POA: Diagnosis not present

## 2018-02-02 DIAGNOSIS — E538 Deficiency of other specified B group vitamins: Secondary | ICD-10-CM | POA: Diagnosis not present

## 2018-02-09 DIAGNOSIS — E538 Deficiency of other specified B group vitamins: Secondary | ICD-10-CM | POA: Diagnosis not present

## 2018-02-13 ENCOUNTER — Inpatient Hospital Stay: Payer: Medicare HMO | Attending: Oncology

## 2018-02-13 DIAGNOSIS — Z9884 Bariatric surgery status: Secondary | ICD-10-CM | POA: Insufficient documentation

## 2018-02-13 DIAGNOSIS — D509 Iron deficiency anemia, unspecified: Secondary | ICD-10-CM | POA: Diagnosis present

## 2018-02-13 DIAGNOSIS — E538 Deficiency of other specified B group vitamins: Secondary | ICD-10-CM | POA: Insufficient documentation

## 2018-02-13 LAB — IRON AND TIBC
Iron: 104 ug/dL (ref 28–170)
Saturation Ratios: 36 % — ABNORMAL HIGH (ref 10.4–31.8)
TIBC: 290 ug/dL (ref 250–450)
UIBC: 186 ug/dL

## 2018-02-13 LAB — CBC
HCT: 42.6 % (ref 36.0–46.0)
Hemoglobin: 13.9 g/dL (ref 12.0–15.0)
MCH: 31.5 pg (ref 26.0–34.0)
MCHC: 32.6 g/dL (ref 30.0–36.0)
MCV: 96.6 fL (ref 80.0–100.0)
Platelets: 161 10*3/uL (ref 150–400)
RBC: 4.41 MIL/uL (ref 3.87–5.11)
RDW: 13 % (ref 11.5–15.5)
WBC: 5.2 10*3/uL (ref 4.0–10.5)
nRBC: 0 % (ref 0.0–0.2)

## 2018-02-13 LAB — VITAMIN B12: Vitamin B-12: 911 pg/mL (ref 180–914)

## 2018-02-13 LAB — FERRITIN: Ferritin: 286 ng/mL (ref 11–307)

## 2018-03-10 ENCOUNTER — Encounter (INDEPENDENT_AMBULATORY_CARE_PROVIDER_SITE_OTHER): Payer: 59

## 2018-03-10 ENCOUNTER — Encounter

## 2018-03-10 ENCOUNTER — Ambulatory Visit (INDEPENDENT_AMBULATORY_CARE_PROVIDER_SITE_OTHER): Payer: 59 | Admitting: Vascular Surgery

## 2018-05-15 ENCOUNTER — Ambulatory Visit: Payer: Medicare HMO | Admitting: Oncology

## 2018-05-15 ENCOUNTER — Other Ambulatory Visit: Payer: Medicare HMO

## 2018-07-10 DIAGNOSIS — E559 Vitamin D deficiency, unspecified: Secondary | ICD-10-CM | POA: Diagnosis not present

## 2018-07-10 DIAGNOSIS — E538 Deficiency of other specified B group vitamins: Secondary | ICD-10-CM | POA: Diagnosis not present

## 2018-07-10 DIAGNOSIS — M4807 Spinal stenosis, lumbosacral region: Secondary | ICD-10-CM | POA: Diagnosis not present

## 2018-08-13 ENCOUNTER — Inpatient Hospital Stay: Payer: Medicare HMO | Admitting: Oncology

## 2018-08-13 ENCOUNTER — Inpatient Hospital Stay: Payer: Medicare HMO

## 2018-08-17 ENCOUNTER — Other Ambulatory Visit: Payer: Self-pay | Admitting: *Deleted

## 2018-08-17 DIAGNOSIS — D509 Iron deficiency anemia, unspecified: Secondary | ICD-10-CM

## 2018-08-18 ENCOUNTER — Inpatient Hospital Stay (HOSPITAL_BASED_OUTPATIENT_CLINIC_OR_DEPARTMENT_OTHER): Payer: Medicare HMO | Admitting: Oncology

## 2018-08-18 ENCOUNTER — Other Ambulatory Visit: Payer: Self-pay

## 2018-08-18 ENCOUNTER — Inpatient Hospital Stay: Payer: Medicare HMO | Attending: Oncology

## 2018-08-18 VITALS — BP 154/97 | HR 74 | Temp 98.5°F | Resp 20 | Wt 238.1 lb

## 2018-08-18 DIAGNOSIS — D696 Thrombocytopenia, unspecified: Secondary | ICD-10-CM | POA: Diagnosis not present

## 2018-08-18 DIAGNOSIS — D509 Iron deficiency anemia, unspecified: Secondary | ICD-10-CM

## 2018-08-18 DIAGNOSIS — E538 Deficiency of other specified B group vitamins: Secondary | ICD-10-CM | POA: Insufficient documentation

## 2018-08-18 LAB — FERRITIN: Ferritin: 204 ng/mL (ref 11–307)

## 2018-08-18 LAB — IRON AND TIBC
Iron: 81 ug/dL (ref 28–170)
Saturation Ratios: 28 % (ref 10.4–31.8)
TIBC: 294 ug/dL (ref 250–450)
UIBC: 213 ug/dL

## 2018-08-18 LAB — CBC
HCT: 40.8 % (ref 36.0–46.0)
Hemoglobin: 13.4 g/dL (ref 12.0–15.0)
MCH: 30.9 pg (ref 26.0–34.0)
MCHC: 32.8 g/dL (ref 30.0–36.0)
MCV: 94.2 fL (ref 80.0–100.0)
Platelets: 143 10*3/uL — ABNORMAL LOW (ref 150–400)
RBC: 4.33 MIL/uL (ref 3.87–5.11)
RDW: 13.3 % (ref 11.5–15.5)
WBC: 4 10*3/uL (ref 4.0–10.5)
nRBC: 0 % (ref 0.0–0.2)

## 2018-08-18 NOTE — Progress Notes (Signed)
Pt here for follow up. Denies any concerns.  

## 2018-08-19 ENCOUNTER — Encounter: Payer: Self-pay | Admitting: Oncology

## 2018-08-19 NOTE — Progress Notes (Signed)
Hematology/Oncology Consult note [facility]  [phone] [phone]  Patient Care Team: Einar Pheasant, MD as PCP - General (Internal Medicine)   Name of the patient: Kelly Wallace  [id number]  1950/02/09   Date of visit: 08/19/18  Diagnosis- iron deficiency anemia likely due to gastric bypass  Chief complaint/ Reason for visit- routine f/u of iron deficiency anemia  Heme/Onc history: patient is a 68 year oldobese African-Americanfemale who has been referred to [place] for evaluation and management of anemia. She has a history of gastric bypass surgery back in 2005. She is not currently taking any iron supplements. Reports possible history of colon cancer in her maternal grandmother but she is not sure. She denies any bleeding in her stool or urine. Denies any gum bleeds and nosebleeds. She uses Aleve occasionally maybe once in a few months for headaches but not consistently. Denies any dark melanotic stools. Denies any loss of appetite or unintentional weight loss. She is postmenopausal  Recent CBC from 02/26/2017 showed white count of 5, H&H of 9.5/30.4 with an MCV of 78.7 and a platelet count of 232. Ferritin levels were low at 4.9. Of note ferritin was low at 7.45 months ago as well.B12 levels were low at 204. TSH was normal. On review of her prior CBCs her hemoglobin has been between 9-10 for the last 1 year. Borderline low MCV levels.No other cytopenias. Last colonoscopy in June 2013 showed internal hemorrhoids but no other abnormal findings.pateint had repeat EGD and colonoscopy inmarch 2019 which did not reveal any bleeding.s he received 2 doses of feraheme in march 2019  Interval history- she feels well. Denies any fatigue. Denies any blood loss in stool or urine  ECOG PS- 1 Pain scale- 0   Review of systems- Review of Systems  Constitutional: Negative for chills, fever, malaise/fatigue and weight loss.  HENT:  Negative for congestion, ear discharge and nosebleeds.   Eyes: Negative for blurred vision.  Respiratory: Negative for cough, hemoptysis, sputum production, shortness of breath and wheezing.   Cardiovascular: Negative for chest pain, palpitations, orthopnea and claudication.  Gastrointestinal: Negative for abdominal pain, blood in stool, constipation, diarrhea, heartburn, melena, nausea and vomiting.  Genitourinary: Negative for dysuria, flank pain, frequency, hematuria and urgency.  Musculoskeletal: Negative for back pain, joint pain and myalgias.  Skin: Negative for rash.  Neurological: Negative for dizziness, tingling, focal weakness, seizures, weakness and headaches.  Endo/Heme/Allergies: Does not bruise/bleed easily.  Psychiatric/Behavioral: Negative for depression and suicidal ideas. The patient does not have insomnia.        No Known Allergies   Past Medical History:  Diagnosis Date  . Anemia    not currently being treated for this  . Arthritis   . History of colon polyps   . Hypertension      Past Surgical History:  Procedure Laterality Date  . CHOLECYSTECTOMY  1989  . COLONOSCOPY WITH PROPOFOL N/A 04/10/2017   Procedure: COLONOSCOPY WITH PROPOFOL;  Surgeon: Lin Landsman, MD;  Location: [facility] ENDOSCOPY;  Service: Gastroenterology;  Laterality: N/A;  . ESOPHAGOGASTRODUODENOSCOPY (EGD) WITH PROPOFOL N/A 04/10/2017   Procedure: ESOPHAGOGASTRODUODENOSCOPY (EGD) WITH PROPOFOL;  Surgeon: Lin Landsman, MD;  Location: [facility] ENDOSCOPY;  Service: Gastroenterology;  Laterality: N/A;  . GASTRIC BYPASS  03/21/03  . JOINT REPLACEMENT Right 04/2014   TOTAL KNEE REPLACEMENT, DR. Rudene Christians, [facility]  . TOTAL KNEE ARTHROPLASTY Left 02/27/2016   Procedure: TOTAL KNEE ARTHROPLASTY;  Surgeon: Hessie Knows, MD;  Location: [facility] ORS;  Service: Orthopedics;  Laterality: Left;  . TUBAL LIGATION  1976    Social History   Socioeconomic History  . Marital status: Married    Spouse name: Not  on file  . Number of children: 2  . Years of education: Not on file  . Highest education level: Not on file  Occupational History    Employer: [employer]  . Financial resource strain: Not on file  . Food insecurity    Worry: Not on file    Inability: Not on file  . Transportation needs    Medical: Not on file    Non-medical: Not on file  Tobacco Use  . Smoking status: Never Smoker  . Smokeless tobacco: Never Used  Substance and Sexual Activity  . Alcohol use: No    Alcohol/week: 0.0 standard drinks  . Drug use: No  . Sexual activity: Yes  Lifestyle  . Physical activity    Days per week: Not on file    Minutes per session: Not on file  . Stress: Not on file  Relationships  . Social Herbalist on phone: Not on file    Gets together: Not on file    Attends religious service: Not on file    Active member of club or organization: Not on file    Attends meetings of clubs or organizations: Not on file    Relationship status: Not on file  . Intimate partner violence    Fear of current or ex partner: Not on file    Emotionally abused: Not on file    Physically abused: Not on file    Forced sexual activity: Not on file  Other Topics Concern  . Not on file  Social History Narrative  . Not on file    Family History  Problem Relation Age of Onset  . Hypertension Mother   . Diabetes Mother   . Cancer Maternal Aunt        question of type  . Cancer Maternal Grandmother        colon  . Breast cancer Neg Hx      Current Outpatient Medications:  .  lisinopril (PRINIVIL,ZESTRIL) 10 MG tablet, Take 1 tablet (10 mg total) by mouth daily., Disp: 90 tablet, Rfl: 0 .  vitamin B-12 (CYANOCOBALAMIN) 1000 MCG tablet, Take 1,000 mcg by mouth daily. , Disp: , Rfl:   Physical exam:  Vitals:   08/18/18 1002  BP: (!) 154/97  Pulse: 74  Resp: 20  Temp: 98.5 F (36.9 C)  TempSrc: Tympanic  SpO2: 97%  Weight: 238 lb 1.6 oz (108 kg)   Physical Exam  Constitutional:      General: She is not in acute distress. HENT:     Head: Normocephalic and atraumatic.  Eyes:     Pupils: Pupils are equal, round, and reactive to light.  Neck:     Musculoskeletal: Normal range of motion.  Cardiovascular:     Rate and Rhythm: Normal rate and regular rhythm.     Heart sounds: Normal heart sounds.  Pulmonary:     Effort: Pulmonary effort is normal.     Breath sounds: Normal breath sounds.  Abdominal:     General: Bowel sounds are normal.     Palpations: Abdomen is soft.  Skin:    General: Skin is warm and dry.  Neurological:     Mental Status: She is alert and oriented to person, place, and time.      CMP Latest Ref Rng &  Units 03/24/2017  Glucose 65 - 99 mg/dL 175(H)  BUN 6 - 20 mg/dL 15  Creatinine 0.44 - 1.00 mg/dL 0.73  Sodium 135 - 145 mmol/L 139  Potassium 3.5 - 5.1 mmol/L 4.1  Chloride 101 - 111 mmol/L 110  CO2 22 - 32 mmol/L 18(L)  Calcium 8.9 - 10.3 mg/dL 8.7(L)  Total Protein 6.5 - 8.1 g/dL 8.3(H)  Total Bilirubin 0.3 - 1.2 mg/dL 0.6  Alkaline Phos 38 - 126 U/L 204(H)  AST 15 - 41 U/L 35  ALT 14 - 54 U/L 22   CBC Latest Ref Rng & Units 08/18/2018  WBC 4.0 - 10.5 K/uL 4.0  Hemoglobin 12.0 - 15.0 g/dL 13.4  Hematocrit 36.0 - 46.0 % 40.8  Platelets 150 - 400 K/uL 143(L)     Assessment and plan- Patient is a 68 y.o. female with h/o iron deficiency anemia here for routine f/u  Patient is not anemic today and her iron studies are normal. She does not require iv iron at this time. IDA likely due to gastric bypass. Repeat iron studies in 3 and 6 months. I will see her back in 6 months  Mild chronic thrombocytopenia: continue to monitor.     Visit Diagnosis 1. Iron deficiency anemia, unspecified iron deficiency anemia type   2. B12 deficiency      Dr. Randa Evens, MD, MPH [facility] at [facility] [phone] 08/19/2018 12:26 PM

## 2018-11-18 ENCOUNTER — Inpatient Hospital Stay: Payer: Medicare HMO | Attending: Oncology

## 2018-12-02 ENCOUNTER — Other Ambulatory Visit: Payer: Self-pay

## 2018-12-02 DIAGNOSIS — Z20822 Contact with and (suspected) exposure to covid-19: Secondary | ICD-10-CM

## 2018-12-04 LAB — NOVEL CORONAVIRUS, NAA: SARS-CoV-2, NAA: NOT DETECTED

## 2018-12-14 ENCOUNTER — Other Ambulatory Visit: Payer: Self-pay | Admitting: Internal Medicine

## 2018-12-14 DIAGNOSIS — Z1231 Encounter for screening mammogram for malignant neoplasm of breast: Secondary | ICD-10-CM

## 2018-12-24 ENCOUNTER — Other Ambulatory Visit: Payer: Self-pay

## 2018-12-24 DIAGNOSIS — I739 Peripheral vascular disease, unspecified: Secondary | ICD-10-CM | POA: Diagnosis not present

## 2018-12-24 DIAGNOSIS — Z833 Family history of diabetes mellitus: Secondary | ICD-10-CM | POA: Diagnosis not present

## 2018-12-24 DIAGNOSIS — Z20822 Contact with and (suspected) exposure to covid-19: Secondary | ICD-10-CM

## 2018-12-24 DIAGNOSIS — I10 Essential (primary) hypertension: Secondary | ICD-10-CM | POA: Diagnosis not present

## 2018-12-24 DIAGNOSIS — R32 Unspecified urinary incontinence: Secondary | ICD-10-CM | POA: Diagnosis not present

## 2018-12-27 LAB — NOVEL CORONAVIRUS, NAA: SARS-CoV-2, NAA: NOT DETECTED

## 2018-12-28 ENCOUNTER — Ambulatory Visit
Admission: RE | Admit: 2018-12-28 | Discharge: 2018-12-28 | Disposition: A | Discharge status: Home / Self Care | Payer: Medicare HMO | Source: Ambulatory Visit | Attending: Internal Medicine | Admitting: Internal Medicine

## 2018-12-28 ENCOUNTER — Other Ambulatory Visit: Payer: Self-pay

## 2018-12-28 DIAGNOSIS — Z1231 Encounter for screening mammogram for malignant neoplasm of breast: Secondary | ICD-10-CM | POA: Insufficient documentation

## 2019-01-06 ENCOUNTER — Other Ambulatory Visit: Payer: Self-pay

## 2019-01-06 ENCOUNTER — Ambulatory Visit (INDEPENDENT_AMBULATORY_CARE_PROVIDER_SITE_OTHER): Payer: Medicare HMO

## 2019-01-06 DIAGNOSIS — Z780 Asymptomatic menopausal state: Secondary | ICD-10-CM

## 2019-01-06 DIAGNOSIS — Z1159 Encounter for screening for other viral diseases: Secondary | ICD-10-CM

## 2019-01-06 DIAGNOSIS — Z0000 Encounter for general adult medical examination without abnormal findings: Secondary | ICD-10-CM

## 2019-01-06 NOTE — Progress Notes (Addendum)
Subjective:   Kelly Wallace is a 68 y.o. female who presents for an Initial Medicare Annual Wellness Visit.  Review of Systems    No ROS.  Medicare Wellness Virtual Visit.  Visual/audio telehealth visit, UTA vital signs.   See social history for additional risk factors.    Cardiac Risk Factors include: advanced age (>24men, >53 women);hypertension     Objective:    Today's Vitals   There is no height or weight on file to calculate BMI.  Advanced Directives 01/06/2019 08/18/2018 04/10/2017 03/11/2017 02/27/2016 02/27/2016 02/21/2016  Does Patient Have a Medical Advance Directive? No No No No No No No  Would patient like information on creating a medical advance directive? Yes (MAU/Ambulatory/Procedural Areas - Information given) No - Patient declined No - Patient declined - No - Patient declined No - Patient declined No - Patient declined  Some encounter information is confidential and restricted. Go to Review Flowsheets activity to see all data.    Current Medications (verified) Outpatient Encounter Medications as of 01/06/2019  Medication Sig  . lisinopril (PRINIVIL,ZESTRIL) 10 MG tablet Take 1 tablet (10 mg total) by mouth daily.  . vitamin B-12 (CYANOCOBALAMIN) 1000 MCG tablet Take 1,000 mcg by mouth daily.    No facility-administered encounter medications on file as of 01/06/2019.    Allergies (verified) Patient has no known allergies.   History: Past Medical History:  Diagnosis Date  . Anemia    not currently being treated for this  . Arthritis   . History of colon polyps   . Hypertension    Past Surgical History:  Procedure Laterality Date  . CHOLECYSTECTOMY  1989  . COLONOSCOPY WITH PROPOFOL N/A 04/10/2017   Procedure: COLONOSCOPY WITH PROPOFOL;  Surgeon: Lin Landsman, MD;  Location: [facility] ENDOSCOPY;  Service: Gastroenterology;  Laterality: N/A;  . ESOPHAGOGASTRODUODENOSCOPY (EGD) WITH PROPOFOL N/A 04/10/2017   Procedure: ESOPHAGOGASTRODUODENOSCOPY (EGD)  WITH PROPOFOL;  Surgeon: Lin Landsman, MD;  Location: [facility] ENDOSCOPY;  Service: Gastroenterology;  Laterality: N/A;  . GASTRIC BYPASS  03/21/03  . JOINT REPLACEMENT Right 04/2014   TOTAL KNEE REPLACEMENT, DR. Rudene Christians, [place]  . TOTAL KNEE ARTHROPLASTY Left 02/27/2016   Procedure: TOTAL KNEE ARTHROPLASTY;  Surgeon: Hessie Knows, MD;  Location: [facility] ORS;  Service: Orthopedics;  Laterality: Left;  . TUBAL LIGATION  1976   Family History  Problem Relation Age of Onset  . Hypertension Mother   . Diabetes Mother   . Cancer Maternal Aunt        question of type  . Cancer Maternal Grandmother        colon  . Breast cancer Cousin 70       maternal side   Social History   Socioeconomic History  . Marital status: Married    Spouse name: Not on file  . Number of children: 2  . Years of education: Not on file  . Highest education level: Not on file  Occupational History    Employer: [employer]  Tobacco Use  . Smoking status: Never Smoker  . Smokeless tobacco: Never Used  Substance and Sexual Activity  . Alcohol use: No    Alcohol/week: 0.0 standard drinks  . Drug use: No  . Sexual activity: Yes  Other Topics Concern  . Not on file  Social History Narrative  . Not on file   Social Determinants of Health   Financial Resource Strain:   . Difficulty of Paying Living Expenses: Not on file  Food Insecurity:   . Worried  About Running Out of Food in the Last Year: Not on file  . Ran Out of Food in the Last Year: Not on file  Transportation Needs:   . Lack of Transportation (Medical): Not on file  . Lack of Transportation (Non-Medical): Not on file  Physical Activity:   . Days of Exercise per Week: Not on file  . Minutes of Exercise per Session: Not on file  Stress:   . Feeling of Stress : Not on file  Social Connections:   . Frequency of Communication with Friends and Family: Not on file  . Frequency of Social Gatherings with Friends and Family: Not on file  . Attends Religious  Services: Not on file  . Active Member of Clubs or Organizations: Not on file  . Attends Archivist Meetings: Not on file  . Marital Status: Not on file    Tobacco Counseling Counseling given: Not Answered   Clinical Intake:  Pre-visit preparation completed: Yes        Diabetes: No  How often do you need to have someone help you when you read instructions, pamphlets, or other written materials from your doctor or pharmacy?: 1 - Never  Interpreter Needed?: No      Activities of Daily Living In your present state of health, do you have any difficulty performing the following activities: 01/06/2019  Hearing? N  Vision? N  Difficulty concentrating or making decisions? N  Walking or climbing stairs? N  Dressing or bathing? N  Doing errands, shopping? N  Preparing Food and eating ? N  Using the Toilet? N  In the past six months, have you accidently leaked urine? N  Do you have problems with loss of bowel control? N  Managing your Medications? N  Managing your Finances? N  Housekeeping or managing your Housekeeping? N  Some recent data might be hidden     Immunizations and Health Maintenance Immunization History  Administered Date(s) Administered  . Influenza [place] 10/27/2012, 10/21/2013   Health Maintenance Due  Topic Date Due  . Hepatitis C Screening  [birth year]  . DEXA SCAN  05/28/2017    Patient Care Team: Einar Pheasant, MD as PCP - General (Internal Medicine)  Indicate any recent Medical Services you may have received from other than [facility] providers in the past year (date may be approximate).     Assessment:   This is a routine wellness examination for Kelly Wallace.  Nurse connected with patient 01/06/19 at 10:30 AM EST by a telephone enabled telemedicine application and verified that I am speaking with the correct person using two identifiers. Patient stated full name and DOB. Patient gave permission to continue with virtual visit. Patient's  location was at home and Nurse's location was at [place] office.   Patient is alert and oriented x3. Patient denies difficulty focusing or concentrating. Patient likes to read and watch television for brain stimulation.   Health Maintenance Due: -PNA vaccine- discussed; to be completed with doctor in visit or local pharmacy.  -Tdap vaccine- discussed; to be completed with doctor in visit or local pharmacy.   -Hepatitis C Screening- consent given; future lab placed.   -Dexa Scan- ordered placed.  See completed HM at the end of note.   Eye: Visual acuity not assessed. Virtual visit. Followed by their ophthalmologist.  Dental: UTD   Hearing: Demonstrates normal hearing during visit.  Safety:  Patient feels safe at home- yes Patient does have smoke detectors at home- yes Patient does wear sunscreen or protective  clothing when in direct sunlight - yes Patient does wear seat belt when in a moving vehicle - yes Patient drives- yes Adequate lighting in walkways free from debris- yes Grab bars and handrails used as appropriate- yes Ambulates with an assistive device- yes; walking stick when walking for exercise. Cell phone on person when ambulating outside of the home- yes  Social: Alcohol intake - no    Smoking history- never   Smokers in home? none Illicit drug use? none  Medication: Taking as directed and without issues.  Pill box in use -yes  Self managed - yes   Covid-19: Precautions and sickness symptoms discussed. Wears mask, social distancing, hand hygiene as appropriate.   Activities of Daily Living Patient denies needing assistance with: household chores, feeding themselves, getting from bed to chair, getting to the toilet, bathing/showering, dressing, managing money, or preparing meals.   Discussed the importance of a healthy diet, water intake and the benefits of aerobic exercise.  Educational material provided.  Physical activity- walking twice weekly   Diet:    Regular Water: fair intake  Other Providers Patient Care Team: Einar Pheasant, MD as PCP - General (Internal Medicine) Hearing/Vision screen  Hearing Screening   125Hz  250Hz  500Hz  1000Hz  2000Hz  3000Hz  4000Hz  6000Hz  8000Hz   Right ear:           Left ear:           Comments: Patient is able to hear conversational tones without difficulty.  No issues reported.  Vision Screening Comments: Wears corrective lenses Visual acuity not assessed, virtual visit.  They have seen their ophthalmologist in the last 12 months.     Dietary issues and exercise activities discussed: Current Exercise Habits: Home exercise routine, Type of exercise: walking  Goals    . Follow up with Provider as scheduled      Depression Screen PHQ 2/9 Scores 01/06/2019 02/26/2017 12/04/2015 08/31/2015 02/24/2013  PHQ - 2 Score 0 0 0 0 0    Fall Risk Fall Risk  01/06/2019 12/04/2015 08/31/2015 02/24/2013  Falls in the past year? 0 No No No  Follow up Falls prevention discussed - - -   Timed Get Up and Go Performed no, virtual visit  Cognitive Function:     6CIT Screen 01/06/2019  What Year? 0 points  What month? 0 points  What time? 0 points  Count back from 20 0 points  Months in reverse 2 points  Repeat phrase 2 points  Total Score 4    Screening Tests Health Maintenance  Topic Date Due  . Hepatitis C Screening  03-10-1950  . DEXA SCAN  05/28/2017  . INFLUENZA VACCINE  04/21/2019 (Originally 08/22/2018)  . TETANUS/TDAP  01/06/2020 (Originally 05/29/1971)  . PNA vac Low Risk Adult (1 of 2 - PCV13) 01/06/2020 (Originally 05/28/2017)  . MAMMOGRAM  12/28/2019  . COLONOSCOPY  04/11/2022     Plan:   Keep all routine maintenance appointments.   Follow up scheduled 02/18/19 @ 230  Medicare Attestation I have personally reviewed: The patient's medical and social history Their use of alcohol, tobacco or illicit drugs Their current medications and supplements The patient's functional ability including  ADLs,fall risks, home safety risks, cognitive, and hearing and visual impairment Diet and physical activities Evidence for depression   I have reviewed and discussed with patient certain preventive protocols, quality metrics, and best practice recommendations.   Varney Biles, LPN   [id number]    Reviewed above information.  Agree with assessment and plan.  Dr Nicki Reaper

## 2019-01-06 NOTE — Patient Instructions (Addendum)
Kelly Wallace , Thank you for taking time to come for your Medicare Wellness Visit. I appreciate your ongoing commitment to your health goals. Please review the following plan we discussed and let me know if I can assist you in the future.   These are the goals we discussed: Goals    . Follow up with Provider as scheduled       This is a list of the screening recommended for you and due dates:  Health Maintenance  Topic Date Due  .  Hepatitis C: One time screening is recommended by Center for Disease Control  (CDC) for  adults born from [birth year] through 1963.   08/16/1950  . DEXA scan (bone density measurement)  05/28/2017  . Flu Shot  04/21/2019*  . Tetanus Vaccine  01/06/2020*  . Pneumonia vaccines (1 of 2 - PCV13) 01/06/2020*  . Mammogram  12/28/2019  . Colon Cancer Screening  04/11/2022  *Topic was postponed. The date shown is not the original due date.    Bone Density Test The bone density test uses a special type of X-ray to measure the amount of calcium and other minerals in your bones. It can measure bone density in the hip and the spine. The test procedure is similar to having a regular X-ray. This test may also be called:  Bone densitometry.  Bone mineral density test.  Dual-energy X-ray absorptiometry (DEXA). You may have this test to:  Diagnose a condition that causes weak or thin bones (osteoporosis).  Screen you for osteoporosis.  Predict your risk for a broken bone (fracture).  Determine how well your osteoporosis treatment is working. Tell a health care provider about:  Any allergies you have.  All medicines you are taking, including vitamins, herbs, eye drops, creams, and over-the-counter medicines.  Any problems you or family members have had with anesthetic medicines.  Any blood disorders you have.  Any surgeries you have had.  Any medical conditions you have.  Whether you are pregnant or may be pregnant.  Any medical tests you have had within the  past 14 days that used contrast material. What are the risks? Generally, this is a safe procedure. However, it does expose you to a small amount of radiation, which can slightly increase your cancer risk. What happens before the procedure?  Do not take any calcium supplements starting 24 hours before your test.  Remove all metal jewelry, eyeglasses, dental appliances, and any other metal objects. What happens during the procedure?   You will lie down on an exam table. There will be an X-ray generator below you and an imaging device above you.  Other devices, such as boxes or braces, may be used to position your body properly for the scan.  The machine will slowly scan your body. You will need to keep still.  The images will show up on a screen in the room. Images will be examined by a specialist after your test is done. The procedure may vary among health care providers and hospitals. What happens after the procedure?  It is up to you to get your test results. Ask your health care provider, or the department that is doing the test, when your results will be ready. Summary  A bone density test is an imaging test that uses a type of X-ray to measure the amount of calcium and other minerals in your bones.  The test may be used to diagnose or screen you for a condition that causes weak or thin bones (  osteoporosis), predict your risk for a broken bone (fracture), or determine how well your osteoporosis treatment is working.  Do not take any calcium supplements starting 24 hours before your test.  Ask your health care provider, or the department that is doing the test, when your results will be ready. This information is not intended to replace advice given to you by your health care provider. Make sure you discuss any questions you have with your health care provider. Document Released: 01/30/2004 Document Revised: 01/23/2017 Document Reviewed: 11/11/2016 Elsevier Patient Education  2020  [employer].

## 2019-01-09 ENCOUNTER — Telehealth: Payer: Self-pay

## 2019-01-09 NOTE — Telephone Encounter (Signed)
Caller advise result not back yet 

## 2019-02-02 ENCOUNTER — Encounter: Payer: Self-pay | Admitting: Internal Medicine

## 2019-02-16 ENCOUNTER — Other Ambulatory Visit: Payer: Self-pay

## 2019-02-16 ENCOUNTER — Inpatient Hospital Stay: Payer: Medicare HMO | Attending: Oncology

## 2019-02-16 DIAGNOSIS — D509 Iron deficiency anemia, unspecified: Secondary | ICD-10-CM | POA: Insufficient documentation

## 2019-02-16 DIAGNOSIS — E538 Deficiency of other specified B group vitamins: Secondary | ICD-10-CM | POA: Insufficient documentation

## 2019-02-16 LAB — CBC WITH DIFFERENTIAL/PLATELET
Abs Immature Granulocytes: 0.02 10*3/uL (ref 0.00–0.07)
Basophils Absolute: 0 10*3/uL (ref 0.0–0.1)
Basophils Relative: 0 %
Eosinophils Absolute: 0.1 10*3/uL (ref 0.0–0.5)
Eosinophils Relative: 2 %
HCT: 41.2 % (ref 36.0–46.0)
Hemoglobin: 12.9 g/dL (ref 12.0–15.0)
Immature Granulocytes: 0 %
Lymphocytes Relative: 40 %
Lymphs Abs: 2.6 10*3/uL (ref 0.7–4.0)
MCH: 30.4 pg (ref 26.0–34.0)
MCHC: 31.3 g/dL (ref 30.0–36.0)
MCV: 96.9 fL (ref 80.0–100.0)
Monocytes Absolute: 0.5 10*3/uL (ref 0.1–1.0)
Monocytes Relative: 7 %
Neutro Abs: 3.2 10*3/uL (ref 1.7–7.7)
Neutrophils Relative %: 51 %
Platelets: 228 10*3/uL (ref 150–400)
RBC: 4.25 MIL/uL (ref 3.87–5.11)
RDW: 13.3 % (ref 11.5–15.5)
WBC: 6.4 10*3/uL (ref 4.0–10.5)
nRBC: 0 % (ref 0.0–0.2)

## 2019-02-16 LAB — IRON AND TIBC
Iron: 61 ug/dL (ref 28–170)
Saturation Ratios: 21 % (ref 10.4–31.8)
TIBC: 291 ug/dL (ref 250–450)
UIBC: 230 ug/dL

## 2019-02-16 LAB — FERRITIN: Ferritin: 296 ng/mL (ref 11–307)

## 2019-02-17 ENCOUNTER — Encounter: Payer: Self-pay | Admitting: Oncology

## 2019-02-17 ENCOUNTER — Other Ambulatory Visit: Payer: Self-pay

## 2019-02-17 NOTE — Progress Notes (Signed)
Patient stated that she had been doing well with no complaints. 

## 2019-02-18 ENCOUNTER — Inpatient Hospital Stay (HOSPITAL_BASED_OUTPATIENT_CLINIC_OR_DEPARTMENT_OTHER): Payer: Medicare HMO | Admitting: Oncology

## 2019-02-18 ENCOUNTER — Inpatient Hospital Stay: Payer: Medicare HMO

## 2019-02-18 ENCOUNTER — Ambulatory Visit (INDEPENDENT_AMBULATORY_CARE_PROVIDER_SITE_OTHER): Payer: Medicare HMO | Admitting: Internal Medicine

## 2019-02-18 VITALS — BP 120/60 | Ht 65.0 in | Wt 230.0 lb

## 2019-02-18 DIAGNOSIS — E538 Deficiency of other specified B group vitamins: Secondary | ICD-10-CM

## 2019-02-18 DIAGNOSIS — R739 Hyperglycemia, unspecified: Secondary | ICD-10-CM | POA: Diagnosis not present

## 2019-02-18 DIAGNOSIS — D696 Thrombocytopenia, unspecified: Secondary | ICD-10-CM

## 2019-02-18 DIAGNOSIS — U071 COVID-19: Secondary | ICD-10-CM | POA: Diagnosis not present

## 2019-02-18 DIAGNOSIS — E559 Vitamin D deficiency, unspecified: Secondary | ICD-10-CM | POA: Diagnosis not present

## 2019-02-18 DIAGNOSIS — I10 Essential (primary) hypertension: Secondary | ICD-10-CM | POA: Diagnosis not present

## 2019-02-18 DIAGNOSIS — R200 Anesthesia of skin: Secondary | ICD-10-CM

## 2019-02-18 DIAGNOSIS — R748 Abnormal levels of other serum enzymes: Secondary | ICD-10-CM

## 2019-02-18 DIAGNOSIS — D649 Anemia, unspecified: Secondary | ICD-10-CM

## 2019-02-18 DIAGNOSIS — D509 Iron deficiency anemia, unspecified: Secondary | ICD-10-CM | POA: Diagnosis not present

## 2019-02-18 NOTE — Progress Notes (Addendum)
[name], female   DOB: [birth year], 69 y.o.   MRN: [id number]   Virtual Visit via video Note  This visit type was conducted due to national recommendations for restrictions regarding the COVID-19 pandemic (e.g. social distancing).  This format is felt to be most appropriate for this patient at this time.  All issues noted in this document were discussed and addressed.  No physical exam was performed (except for noted visual exam findings with Video Visits).   I connected with [name] by a video enabled telemedicine application and verified that I am speaking with the correct person using two identifiers. Location patient: home Location provider: work  Persons participating in the virtual visit: patient, provider  The limitations, risks, security and privacy concerns of performing an evaluation and management service by video and the availability of in person appointments have been discussed.  The patient expressed understanding and agreed to proceed.   Reason for visit: work in appt.   HPI: Is followed by Dr Janese Banks for [place].  Has a history of gastric bypass surgery 2005.  Just evaluated today.  hgb has been stable.  Has not required IV iron recently.  Was diagnosed with covid 02/01/19.  Had cold and cough symptoms.  Also headache and dizziness.  No loss of taste or smell.  No nausea, vomiting or diarrhea.  No chest pain, chest congestion or sob.  Is feeling better. Eating.  Discussed rest and staying hydrated.  Family has been sick as well.  Handling stress.  Blood pressure [id number] systolic.  States doing better taking her blood pressure medication.     ROS: See pertinent positives and negatives per HPI.  Past Medical History:  Diagnosis Date  . Anemia    not currently being treated for this  . Arthritis   . History of colon polyps   . Hypertension     Past Surgical History:  Procedure Laterality Date  . CHOLECYSTECTOMY  1989  . COLONOSCOPY WITH PROPOFOL N/A 04/10/2017   Procedure: COLONOSCOPY WITH PROPOFOL;  Surgeon: Lin Landsman, MD;  Location: [facility] ENDOSCOPY;  Service: Gastroenterology;  Laterality: N/A;  . ESOPHAGOGASTRODUODENOSCOPY (EGD) WITH PROPOFOL N/A 04/10/2017   Procedure: ESOPHAGOGASTRODUODENOSCOPY (EGD) WITH PROPOFOL;  Surgeon: Lin Landsman, MD;  Location: [facility] ENDOSCOPY;  Service: Gastroenterology;  Laterality: N/A;  . GASTRIC BYPASS  03/21/03  . JOINT REPLACEMENT Right 04/2014   TOTAL KNEE REPLACEMENT, DR. Rudene Christians, [place]  . TOTAL KNEE ARTHROPLASTY Left 02/27/2016   Procedure: TOTAL KNEE ARTHROPLASTY;  Surgeon: Hessie Knows, MD;  Location: [facility] ORS;  Service: Orthopedics;  Laterality: Left;  . TUBAL LIGATION  1976    Family History  Problem Relation Age of Onset  . Hypertension Mother   . Diabetes Mother   . Cancer Maternal Aunt        question of type  . Cancer Maternal Grandmother        colon  . Breast cancer Cousin 68       maternal side    SOCIAL HX: reviewed.    Current Outpatient Medications:  .  lisinopril (PRINIVIL,ZESTRIL) 10 MG tablet, Take 1 tablet (10 mg total) by mouth daily., Disp: 90 tablet, Rfl: 0 .  vitamin B-12 (CYANOCOBALAMIN) 1000 MCG tablet, Take 1,000 mcg by mouth daily. , Disp: , Rfl:   EXAM:  GENERAL: alert, oriented, appears well and in no acute distress  HEENT: atraumatic, conjunttiva clear, no obvious abnormalities on inspection of external nose and ears  NECK: normal movements of  the head and neck  LUNGS: on inspection no signs of respiratory distress, breathing rate appears normal, no obvious gross SOB, gasping or wheezing  CV: no obvious cyanosis  PSYCH/NEURO: pleasant and cooperative, no obvious depression or anxiety, speech and thought processing grossly intact  ASSESSMENT AND PLAN:  Discussed the following assessment and plan:  Anemia Has seen GI.  Being followed by hematology.  Last received IV iron 11/2017.  hgb wnl.  Follow.    Bilateral leg numbness Had nerve conduction  studies.  Polyneuropathy.  Follow.    Elevated alkaline phosphatase level Being followed by hematology.  Follow liver panel.   Essential hypertension, benign Reports some [id number] systolic readings.  Discussed taking her blood pressure medication daily and not skipping.  Have her spot check her pressure.  F/u with me.  If persistent elevation, will require additional medication.  Follow pressures.  Follow metabolic panel.    Vitamin D deficiency Follow vitamin D level.    Thrombocytopenia ([place]) Most recheck check - platelet count wnl.    COVID-19 virus infection Diagnosed with covid 02/01/19.  Improved.  No chest congestion or sob.  Eating.  Rest.  Follow.     Orders Placed This Encounter  Procedures  . Hemoglobin A1c    Standing Status:   Future    Standing Expiration Date:   02/21/2020  . Hepatic function panel    Standing Status:   Future    Standing Expiration Date:   02/21/2020  . Lipid panel    Standing Status:   Future    Standing Expiration Date:   02/21/2020  . TSH    Standing Status:   Future    Standing Expiration Date:   02/21/2020  . VITAMIN D 25 Hydroxy (Vit-D Deficiency, Fractures)    Standing Status:   Future    Standing Expiration Date:   02/21/2020  . Basic metabolic panel    Standing Status:   Future    Standing Expiration Date:   02/21/2020     I discussed the assessment and treatment plan with the patient. The patient was provided an opportunity to ask questions and all were answered. The patient agreed with the plan and demonstrated an understanding of the instructions.   The patient was advised to call back or seek an in-person evaluation if the symptoms worsen or if the condition fails to improve as anticipated.   Einar Pheasant, MD

## 2019-02-19 NOTE — Progress Notes (Signed)
I connected with Kelly Wallace on 02/19/19 at 10:30 AM EST by video enabled telemedicine visit and verified that I am speaking with the correct person using two identifiers.   I discussed the limitations, risks, security and privacy concerns of performing an evaluation and management service by telemedicine and the availability of in-person appointments. I also discussed with the patient that there may be a patient responsible charge related to this service. The patient expressed understanding and agreed to proceed.  Other persons participating in the visit and their role in the encounter:  none  Patient's location:  home Provider's location:  work  Risk analyst Complaint: Routine follow-up of iron deficiency anemia  History of present illness: patient is a 69 year oldobese African-Americanfemale who has been referred to [place] for evaluation and management of anemia. She has a history of gastric bypass surgery back in 2005. She is not currently taking any iron supplements. Reports possible history of colon cancer in her maternal grandmother but she is not sure.  She is postmenopausal  Recent CBC from 02/26/2017 showed white count of 5, H&H of 9.5/30.4 with an MCV of 78.7 and a platelet count of 232. Ferritin levels were low at 4.9. Of note ferritin was low at 7.45 months ago as well.B12 levels were low at 204. TSH was normal. On review of her prior CBCs her hemoglobin has been between 9-10 for the last 1 year. Borderline low MCV levels.No other cytopenias. Last colonoscopy in June 2013 showed internal hemorrhoids but no other abnormal findings.pateint had repeat EGD and colonoscopy inmarch 2019 which did not reveal any bleeding.s he received 2 doses of feraheme in march 2019  Interval history: Overall patient feels well and denies any complaints at this time.  She has not noticed any blood in stool or urine.  Denies any dark melanotic stools.   Review of Systems  Constitutional: Negative  for chills, fever, malaise/fatigue and weight loss.  HENT: Negative for congestion, ear discharge and nosebleeds.   Eyes: Negative for blurred vision.  Respiratory: Negative for cough, hemoptysis, sputum production, shortness of breath and wheezing.   Cardiovascular: Negative for chest pain, palpitations, orthopnea and claudication.  Gastrointestinal: Negative for abdominal pain, blood in stool, constipation, diarrhea, heartburn, melena, nausea and vomiting.  Genitourinary: Negative for dysuria, flank pain, frequency, hematuria and urgency.  Musculoskeletal: Negative for back pain, joint pain and myalgias.  Skin: Negative for rash.  Neurological: Negative for dizziness, tingling, focal weakness, seizures, weakness and headaches.  Endo/Heme/Allergies: Does not bruise/bleed easily.  Psychiatric/Behavioral: Negative for depression and suicidal ideas. The patient does not have insomnia.     No Known Allergies  Past Medical History:  Diagnosis Date  . Anemia    not currently being treated for this  . Arthritis   . History of colon polyps   . Hypertension     Past Surgical History:  Procedure Laterality Date  . CHOLECYSTECTOMY  1989  . COLONOSCOPY WITH PROPOFOL N/A 04/10/2017   Procedure: COLONOSCOPY WITH PROPOFOL;  Surgeon: Lin Landsman, MD;  Location: [facility] ENDOSCOPY;  Service: Gastroenterology;  Laterality: N/A;  . ESOPHAGOGASTRODUODENOSCOPY (EGD) WITH PROPOFOL N/A 04/10/2017   Procedure: ESOPHAGOGASTRODUODENOSCOPY (EGD) WITH PROPOFOL;  Surgeon: Lin Landsman, MD;  Location: [facility] ENDOSCOPY;  Service: Gastroenterology;  Laterality: N/A;  . GASTRIC BYPASS  03/21/03  . JOINT REPLACEMENT Right 04/2014   TOTAL KNEE REPLACEMENT, DR. Rudene Christians, [place]  . TOTAL KNEE ARTHROPLASTY Left 02/27/2016   Procedure: TOTAL KNEE ARTHROPLASTY;  Surgeon: Hessie Knows, MD;  Location: [facility] ORS;  Service: Orthopedics;  Laterality: Left;  . TUBAL LIGATION  1976    Social History   Socioeconomic History   . Marital status: Married    Spouse name: Not on file  . Number of children: 2  . Years of education: Not on file  . Highest education level: Not on file  Occupational History    Employer: [employer]  Tobacco Use  . Smoking status: Never Smoker  . Smokeless tobacco: Never Used  Substance and Sexual Activity  . Alcohol use: No    Alcohol/week: 0.0 standard drinks  . Drug use: No  . Sexual activity: Yes  Other Topics Concern  . Not on file  Social History Narrative  . Not on file   Social Determinants of Health   Financial Resource Strain:   . Difficulty of Paying Living Expenses: Not on file  Food Insecurity:   . Worried About Charity fundraiser in the Last Year: Not on file  . Ran Out of Food in the Last Year: Not on file  Transportation Needs:   . Lack of Transportation (Medical): Not on file  . Lack of Transportation (Non-Medical): Not on file  Physical Activity:   . Days of Exercise per Week: Not on file  . Minutes of Exercise per Session: Not on file  Stress:   . Feeling of Stress : Not on file  Social Connections:   . Frequency of Communication with Friends and Family: Not on file  . Frequency of Social Gatherings with Friends and Family: Not on file  . Attends Religious Services: Not on file  . Active Member of Clubs or Organizations: Not on file  . Attends Archivist Meetings: Not on file  . Marital Status: Not on file  Intimate Partner Violence:   . Fear of Current or Ex-Partner: Not on file  . Emotionally Abused: Not on file  . Physically Abused: Not on file  . Sexually Abused: Not on file    Family History  Problem Relation Age of Onset  . Hypertension Mother   . Diabetes Mother   . Cancer Maternal Aunt        question of type  . Cancer Maternal Grandmother        colon  . Breast cancer Cousin 68       maternal side     Current Outpatient Medications:  .  lisinopril (PRINIVIL,ZESTRIL) 10 MG tablet, Take 1 tablet (10 mg total) by mouth  daily., Disp: 90 tablet, Rfl: 0 .  vitamin B-12 (CYANOCOBALAMIN) 1000 MCG tablet, Take 1,000 mcg by mouth daily. , Disp: , Rfl:   No results found.  No images are attached to the encounter.   CMP Latest Ref Rng & Units 03/24/2017  Glucose 65 - 99 mg/dL 175(H)  BUN 6 - 20 mg/dL 15  Creatinine 0.44 - 1.00 mg/dL 0.73  Sodium 135 - 145 mmol/L 139  Potassium 3.5 - 5.1 mmol/L 4.1  Chloride 101 - 111 mmol/L 110  CO2 22 - 32 mmol/L 18(L)  Calcium 8.9 - 10.3 mg/dL 8.7(L)  Total Protein 6.5 - 8.1 g/dL 8.3(H)  Total Bilirubin 0.3 - 1.2 mg/dL 0.6  Alkaline Phos 38 - 126 U/L 204(H)  AST 15 - 41 U/L 35  ALT 14 - 54 U/L 22   CBC Latest Ref Rng & Units 02/16/2019  WBC 4.0 - 10.5 K/uL 6.4  Hemoglobin 12.0 - 15.0 g/dL 12.9  Hematocrit 36.0 - 46.0 % 41.2  Platelets 150 - 400 K/uL  228     Observation/objective: Appears in no acute distress of a video visit today.  Breathing is nonlabored  Assessment and plan: Patient is a 69 year old female with history of iron deficiency anemia here for routine follow-up  Patient last required Feraheme in November 2019.  Since then her hemoglobin has remained stable between 12-13.  Her iron studies are normal at this time and she does not require any IV iron.  Given the stability of her hemoglobin I am inclined to monitor this every 4 months at this time  Follow-up instructions: CBC ferritin and iron studies in 4 months and 8 months and I will see her back in 8 months for a video visit.  We will also check B12 levels given her history of B12 deficiency  I discussed the assessment and treatment plan with the patient. The patient was provided an opportunity to ask questions and all were answered. The patient agreed with the plan and demonstrated an understanding of the instructions.   The patient was advised to call back or seek an in-person evaluation if the symptoms worsen or if the condition fails to improve as anticipated.   Visit Diagnosis: 1. Iron  deficiency anemia, unspecified iron deficiency anemia type   2. B12 deficiency     Dr. Randa Evens, MD, MPH [facility] at [facility] Tel- [phone] 02/19/2019 12:19 PM

## 2019-02-21 ENCOUNTER — Encounter: Payer: Self-pay | Admitting: Internal Medicine

## 2019-02-21 DIAGNOSIS — D696 Thrombocytopenia, unspecified: Secondary | ICD-10-CM | POA: Insufficient documentation

## 2019-02-21 DIAGNOSIS — U071 COVID-19: Secondary | ICD-10-CM | POA: Insufficient documentation

## 2019-02-21 NOTE — Assessment & Plan Note (Signed)
Follow vitamin D level.  

## 2019-02-21 NOTE — Assessment & Plan Note (Signed)
Diagnosed with covid 02/01/19.  Improved.  No chest congestion or sob.  Eating.  Rest.  Follow.

## 2019-02-21 NOTE — Assessment & Plan Note (Signed)
Has seen GI.  Being followed by hematology.  Last received IV iron 11/2017.  hgb wnl.  Follow.

## 2019-02-21 NOTE — Assessment & Plan Note (Signed)
Being followed by hematology.  Follow liver panel.

## 2019-02-21 NOTE — Assessment & Plan Note (Signed)
Most recheck check - platelet count wnl.

## 2019-02-21 NOTE — Assessment & Plan Note (Signed)
Had nerve conduction studies.  Polyneuropathy.  Follow.

## 2019-02-21 NOTE — Assessment & Plan Note (Signed)
Reports some [id number] systolic readings.  Discussed taking her blood pressure medication daily and not skipping.  Have her spot check her pressure.  F/u with me.  If persistent elevation, will require additional medication.  Follow pressures.  Follow metabolic panel.

## 2019-03-22 ENCOUNTER — Telehealth: Payer: Self-pay | Admitting: Internal Medicine

## 2019-03-22 DIAGNOSIS — H2513 Age-related nuclear cataract, bilateral: Secondary | ICD-10-CM | POA: Diagnosis not present

## 2019-03-22 NOTE — Telephone Encounter (Signed)
Pt called and wanted to know if she can still do her nurse visit on Thursday for prevnar and labs if she is getting Covid vaccine on same day? Please advise.

## 2019-03-22 NOTE — Telephone Encounter (Signed)
Left detailed message for patient letting her know that she could not get her two vaccines together. There needs to be at least 2 weeks in between.

## 2019-03-23 IMAGING — MR MR LUMBAR SPINE W/O CM
5 series · 35 of 48 positions shown · non-contrast
Comparison: None.

CLINICAL DATA: Low back pain when standing.  Bilateral leg numbness

EXAM:
MRI LUMBAR SPINE WITHOUT CONTRAST
TECHNIQUE: Multiplanar, multisequence MR imaging of the lumbar spine was
performed. No intravenous contrast was administered.

[Series 3: T2 · sagittal · 4.0mm · 0.81mm/px · 7 of 15 slices shown (1 of 2)]
[im 1/15]
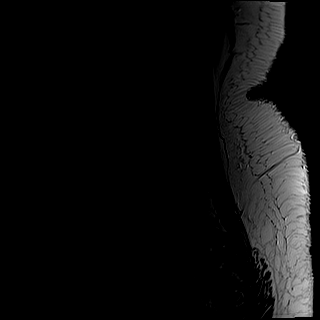
[im 3/15]
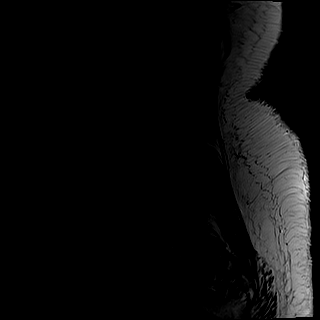
[im 5/15]
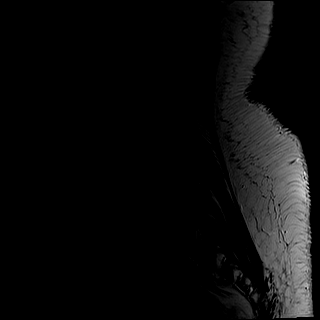
[im 8/15]
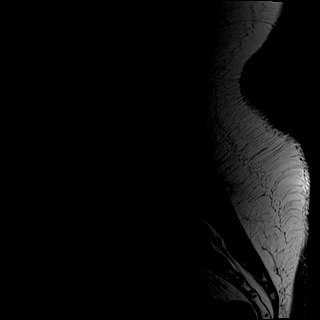
[im 10/15]
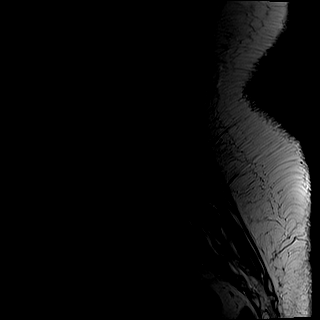
[im 12/15]
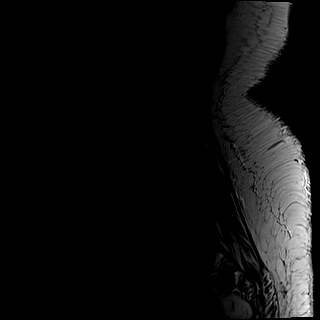
[im 15/15]
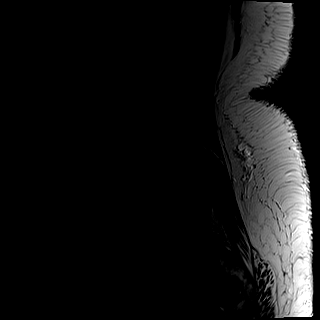

[Series 4: T1 · sagittal · 4.0mm · 0.81mm/px · 7 of 15 slices shown (1 of 2)]
[im 1/15]
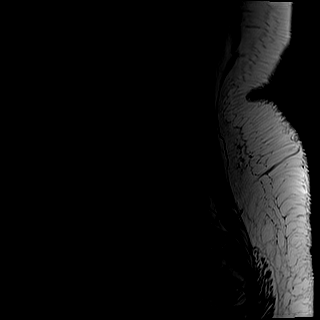
[im 3/15]
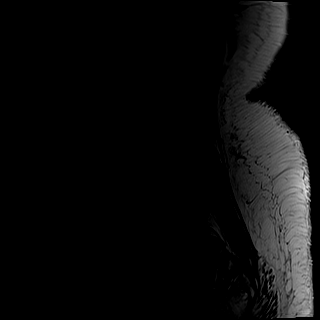
[im 5/15]
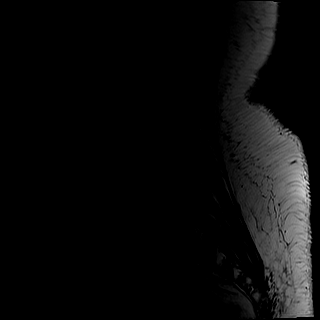
[im 8/15]
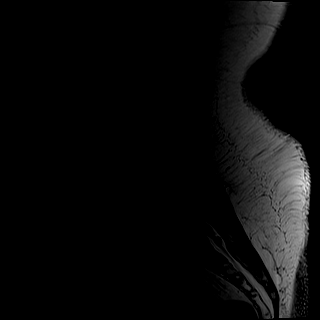
[im 10/15]
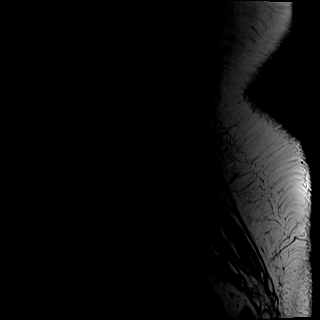
[im 12/15]
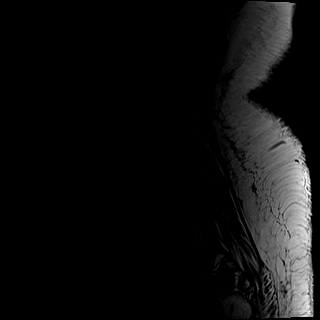
[im 15/15]
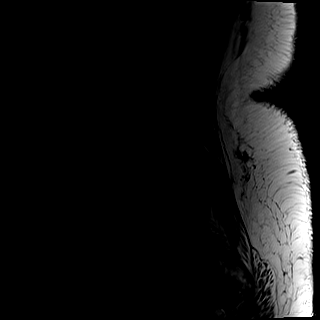

[Series 5: STIR · sagittal · 4.0mm · 0.81mm/px · 5 of 15 slices shown]
[im 1/15]
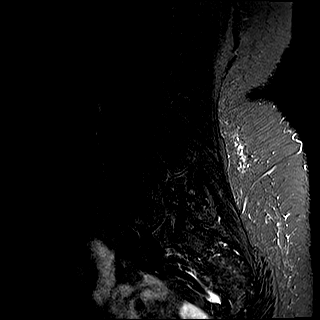
[im 3/15]
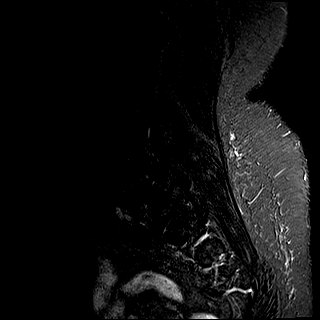
[im 6/15]
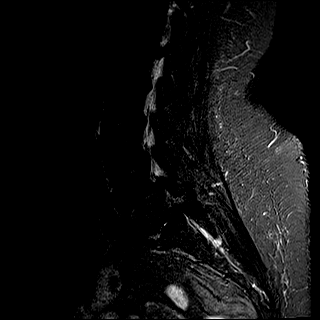
[im 9/15]
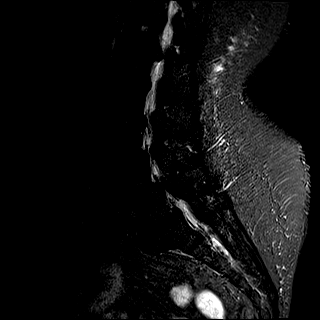
[im 12/15]
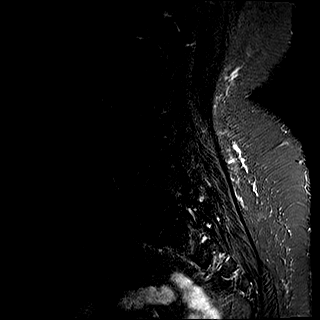

[Series 6: T2 · axial · 4.0mm · 0.78mm/px · z∈[-108,+85]mm · 8 of 34 slices shown (2 of 2)]
[im 1/34]
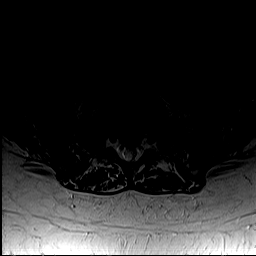
[im 6/34]
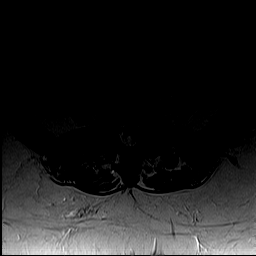
[im 11/34]
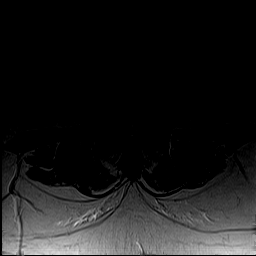
[im 16/34]
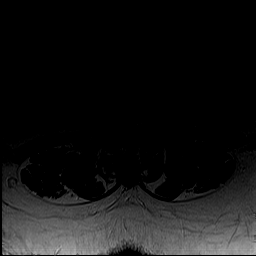
[im 18/34]
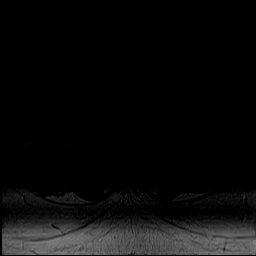
[im 23/34]
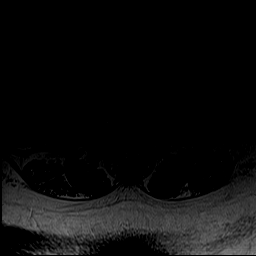
[im 28/34]
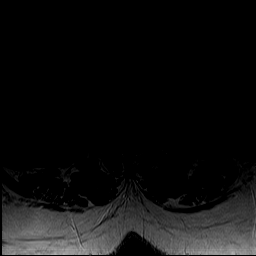
[im 34/34]
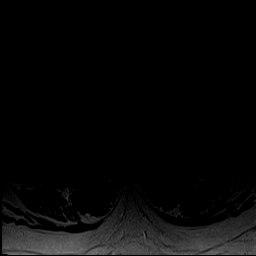

[Series 7: T1 · axial · 4.0mm · 0.39mm/px · z∈[-108,+85]mm · 8 of 34 slices shown (2 of 2)]
[im 1/34]
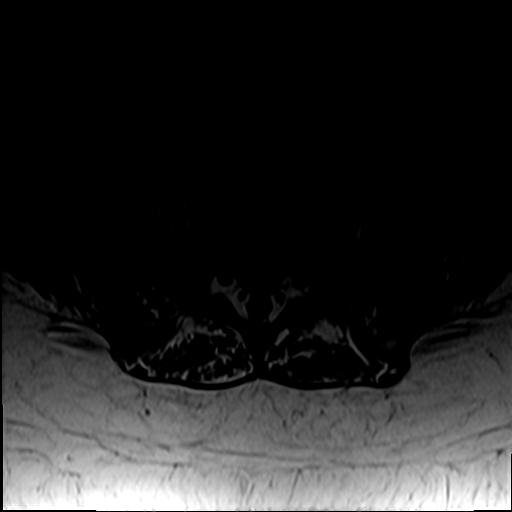
[im 6/34]
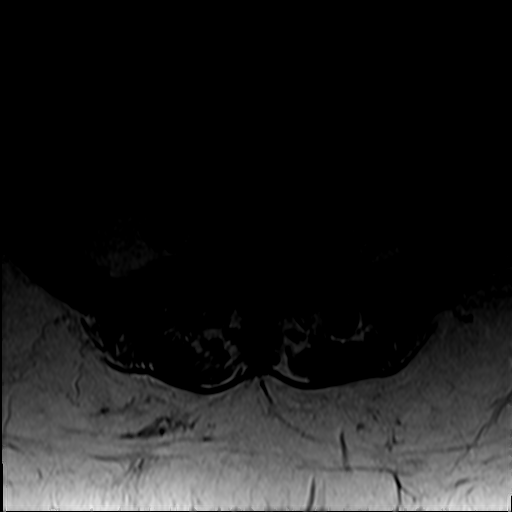
[im 11/34]
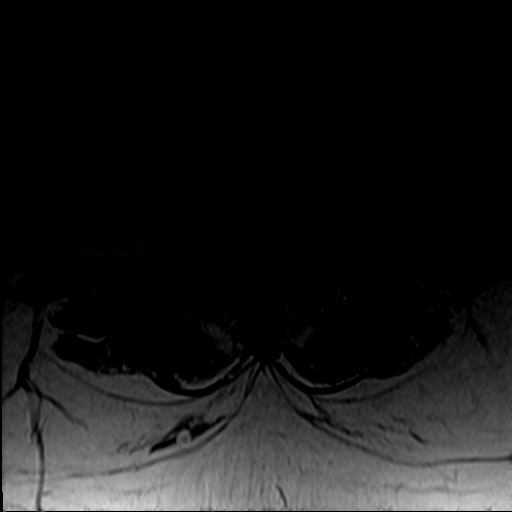
[im 16/34]
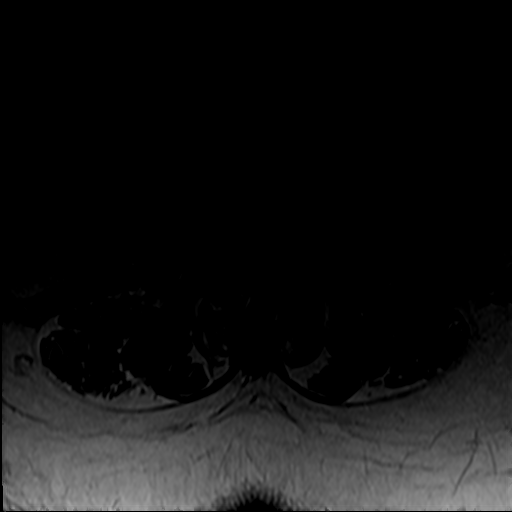
[im 18/34]
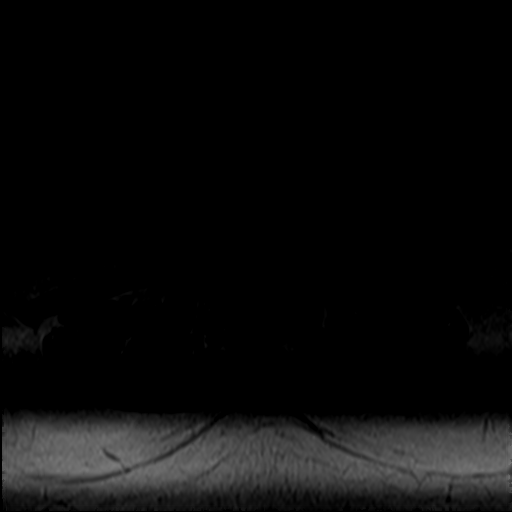
[im 23/34]
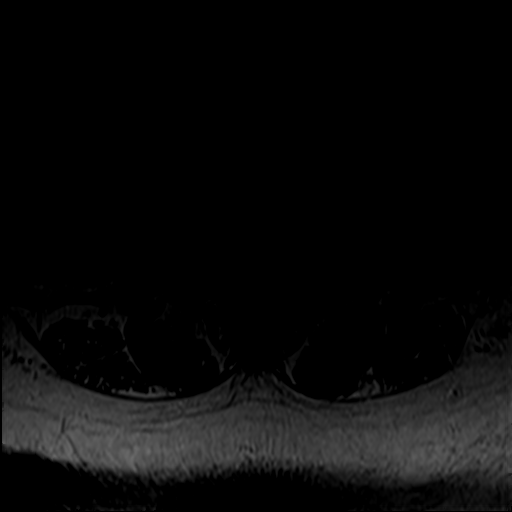
[im 28/34]
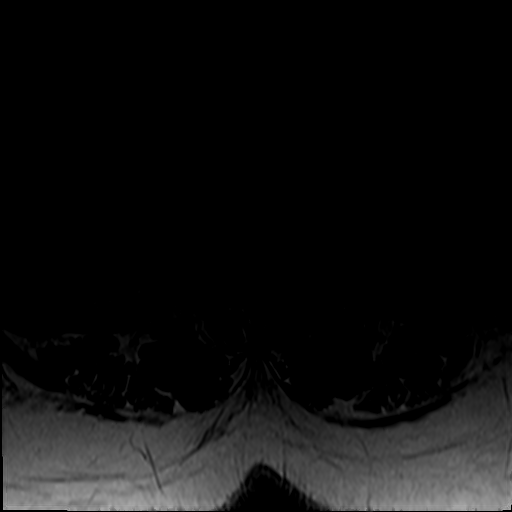
[im 34/34]
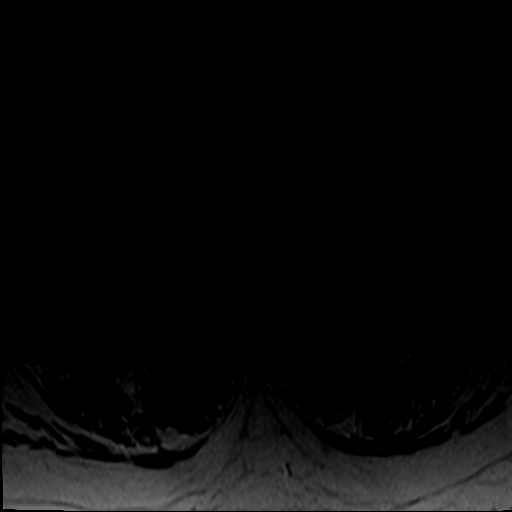

[35 of 48 positions shown; findings below may reference images not displayed]

FINDINGS: Segmentation:  5 lumbar type vertebral bodies.

Alignment: 2 mm anterolisthesis L3-4. 3 mm anterolisthesis L4-5. 4
mm anterolisthesis L5-S1.

Vertebrae:  No fracture or primary bone lesion.

Conus medullaris and cauda equina: Conus extends to the L1 level.
Conus and cauda equina appear normal.

Paraspinal and other soft tissues: Negative

Disc levels:

T12-L1: Broad-based disc herniation more prominent towards the left
with slight caudal down turning. This narrows the spinal canal,
effaces the subarachnoid space and has potential to have some
compressive effect upon the distal cord/conus.

L1-2: Mild disc bulge.  No compressive stenosis.

L2-3: Mild disc bulge.  No compressive stenosis.

L3-4: Bilateral facet arthropathy with 2 mm of anterolisthesis.
Bulging of the disc. Stenosis of the lateral recesses and neural
foramina which could possibly be symptomatic.

L4-5: Bilateral facet arthropathy with 3 mm of anterolisthesis.
Bulging of the disc. Stenosis of the lateral recesses and neural
foramina that could cause neural compression.

L5-S1: Bilateral facet arthropathy with 4 mm of anterolisthesis.
Shallow protrusion of the disc. Stenosis of the subarticular lateral
recesses and neural foramina that could cause neural compression on
either or both sides.
IMPRESSION: T12-L1: Broad-based disc herniation slightly more prominent towards
the left, with slight caudal down turning. Spinal stenosis with
effacement the subarachnoid space. Some potential for neural
compression affecting the distal cord/conus and nerve roots of the
cauda equina.

L3-4: Facet arthropathy with 2 mm of anterolisthesis. Bulging of the
disc. Mild stenosis of the lateral recesses and foramina.

L4-5: Facet arthropathy with 3 mm of anterolisthesis. Bulging of the
disc. Stenosis of the lateral recesses and foramina that could cause
neural compression on either or both sides.

L5-S1: Facet arthropathy with 4 mm of anterolisthesis. Shallow
protrusion of the disc. Bilateral subarticular lateral recess and
foraminal stenosis that could cause neural compression.
Particularly, the exiting L5 nerves are at risk.

## 2019-03-25 ENCOUNTER — Other Ambulatory Visit: Payer: Self-pay

## 2019-03-25 ENCOUNTER — Ambulatory Visit: Payer: Medicare HMO

## 2019-03-25 ENCOUNTER — Other Ambulatory Visit (INDEPENDENT_AMBULATORY_CARE_PROVIDER_SITE_OTHER): Payer: Medicare Other

## 2019-03-25 DIAGNOSIS — R748 Abnormal levels of other serum enzymes: Secondary | ICD-10-CM

## 2019-03-25 DIAGNOSIS — E559 Vitamin D deficiency, unspecified: Secondary | ICD-10-CM

## 2019-03-25 DIAGNOSIS — I10 Essential (primary) hypertension: Secondary | ICD-10-CM

## 2019-03-25 DIAGNOSIS — Z1159 Encounter for screening for other viral diseases: Secondary | ICD-10-CM

## 2019-03-25 DIAGNOSIS — R739 Hyperglycemia, unspecified: Secondary | ICD-10-CM | POA: Diagnosis not present

## 2019-03-25 LAB — HEPATIC FUNCTION PANEL
ALT: 12 U/L (ref 0–35)
AST: 16 U/L (ref 0–37)
Albumin: 3.6 g/dL (ref 3.5–5.2)
Alkaline Phosphatase: 160 U/L — ABNORMAL HIGH (ref 39–117)
Bilirubin, Direct: 0.2 mg/dL (ref 0.0–0.3)
Total Bilirubin: 0.7 mg/dL (ref 0.2–1.2)
Total Protein: 6.7 g/dL (ref 6.0–8.3)

## 2019-03-25 LAB — LIPID PANEL
Cholesterol: 98 mg/dL (ref 0–200)
HDL: 44.3 mg/dL (ref >39.00)
LDL Cholesterol: 39 mg/dL (ref 0–99)
NonHDL: 53.54
Total CHOL/HDL Ratio: 2
Triglycerides: 72 mg/dL (ref 0.0–149.0)
VLDL: 14.4 mg/dL (ref 0.0–40.0)

## 2019-03-25 LAB — BASIC METABOLIC PANEL
BUN: 10 mg/dL (ref 6–23)
CO2: 26 mEq/L (ref 19–32)
Calcium: 7.7 mg/dL — ABNORMAL LOW (ref 8.4–10.5)
Chloride: 108 mEq/L (ref 96–112)
Creatinine, Ser: 0.6 mg/dL (ref 0.40–1.20)
GFR: 120.72 mL/min (ref >60.00)
Glucose, Bld: 94 mg/dL (ref 70–99)
Potassium: 3.6 mEq/L (ref 3.5–5.1)
Sodium: 140 mEq/L (ref 135–145)

## 2019-03-25 LAB — TSH: TSH: 1.26 u[IU]/mL (ref 0.35–4.50)

## 2019-03-25 LAB — HEMOGLOBIN A1C: Hgb A1c MFr Bld: 5.6 % (ref 4.6–6.5)

## 2019-03-25 LAB — VITAMIN D 25 HYDROXY (VIT D DEFICIENCY, FRACTURES): VITD: 7.07 ng/mL — ABNORMAL LOW (ref 30.00–100.00)

## 2019-03-26 LAB — HEPATITIS C ANTIBODY
Hepatitis C Ab: NONREACTIVE
SIGNAL TO CUT-OFF: 0.04 (ref <1.00)

## 2019-03-27 ENCOUNTER — Other Ambulatory Visit: Payer: Self-pay | Admitting: Internal Medicine

## 2019-03-27 NOTE — Progress Notes (Signed)
Order placed for f/u labs.  

## 2019-03-29 ENCOUNTER — Other Ambulatory Visit: Payer: Self-pay

## 2019-03-29 MED ORDER — VITAMIN D (ERGOCALCIFEROL) 1.25 MG (50000 UNIT) PO CAPS: 50000.0000 [IU] | ORAL_CAPSULE | ORAL | 2 refills | Status: DC

## 2019-04-13 ENCOUNTER — Ambulatory Visit: Payer: Medicare HMO

## 2019-04-13 ENCOUNTER — Other Ambulatory Visit: Payer: Medicare HMO

## 2019-06-18 ENCOUNTER — Inpatient Hospital Stay: Payer: Medicare Other | Attending: Oncology

## 2019-06-18 ENCOUNTER — Other Ambulatory Visit: Payer: Self-pay

## 2019-06-18 DIAGNOSIS — D509 Iron deficiency anemia, unspecified: Secondary | ICD-10-CM | POA: Diagnosis not present

## 2019-06-18 DIAGNOSIS — E538 Deficiency of other specified B group vitamins: Secondary | ICD-10-CM | POA: Diagnosis not present

## 2019-06-18 LAB — CBC WITH DIFFERENTIAL/PLATELET
Abs Immature Granulocytes: 0.01 10*3/uL (ref 0.00–0.07)
Basophils Absolute: 0 10*3/uL (ref 0.0–0.1)
Basophils Relative: 1 %
Eosinophils Absolute: 0.2 10*3/uL (ref 0.0–0.5)
Eosinophils Relative: 3 %
HCT: 38.3 % (ref 36.0–46.0)
Hemoglobin: 13 g/dL (ref 12.0–15.0)
Immature Granulocytes: 0 %
Lymphocytes Relative: 41 %
Lymphs Abs: 2.4 10*3/uL (ref 0.7–4.0)
MCH: 31.9 pg (ref 26.0–34.0)
MCHC: 33.9 g/dL (ref 30.0–36.0)
MCV: 94.1 fL (ref 80.0–100.0)
Monocytes Absolute: 0.4 10*3/uL (ref 0.1–1.0)
Monocytes Relative: 6 %
Neutro Abs: 2.9 10*3/uL (ref 1.7–7.7)
Neutrophils Relative %: 49 %
Platelets: 174 10*3/uL (ref 150–400)
RBC: 4.07 MIL/uL (ref 3.87–5.11)
RDW: 13.2 % (ref 11.5–15.5)
WBC: 5.9 10*3/uL (ref 4.0–10.5)
nRBC: 0 % (ref 0.0–0.2)

## 2019-06-18 LAB — IRON AND TIBC
Iron: 68 ug/dL (ref 28–170)
Saturation Ratios: 23 % (ref 10.4–31.8)
TIBC: 291 ug/dL (ref 250–450)
UIBC: 223 ug/dL

## 2019-06-18 LAB — VITAMIN B12: Vitamin B-12: 157 pg/mL — ABNORMAL LOW (ref 180–914)

## 2019-06-18 LAB — FERRITIN: Ferritin: 145 ng/mL (ref 11–307)

## 2019-06-22 ENCOUNTER — Other Ambulatory Visit: Payer: Self-pay

## 2019-06-22 ENCOUNTER — Telehealth: Payer: Self-pay

## 2019-06-22 ENCOUNTER — Ambulatory Visit (INDEPENDENT_AMBULATORY_CARE_PROVIDER_SITE_OTHER): Payer: Medicare Other | Admitting: Internal Medicine

## 2019-06-22 ENCOUNTER — Encounter: Payer: Self-pay | Admitting: Internal Medicine

## 2019-06-22 VITALS — BP 140/82 | HR 78 | Temp 97.8°F | Resp 16 | Ht 65.0 in | Wt 236.0 lb

## 2019-06-22 DIAGNOSIS — M25561 Pain in right knee: Secondary | ICD-10-CM

## 2019-06-22 DIAGNOSIS — Z0000 Encounter for general adult medical examination without abnormal findings: Secondary | ICD-10-CM

## 2019-06-22 DIAGNOSIS — D696 Thrombocytopenia, unspecified: Secondary | ICD-10-CM | POA: Diagnosis not present

## 2019-06-22 DIAGNOSIS — I10 Essential (primary) hypertension: Secondary | ICD-10-CM | POA: Diagnosis not present

## 2019-06-22 DIAGNOSIS — M25562 Pain in left knee: Secondary | ICD-10-CM

## 2019-06-22 DIAGNOSIS — E611 Iron deficiency: Secondary | ICD-10-CM

## 2019-06-22 DIAGNOSIS — G8929 Other chronic pain: Secondary | ICD-10-CM

## 2019-06-22 DIAGNOSIS — E559 Vitamin D deficiency, unspecified: Secondary | ICD-10-CM | POA: Diagnosis not present

## 2019-06-22 DIAGNOSIS — D649 Anemia, unspecified: Secondary | ICD-10-CM

## 2019-06-22 DIAGNOSIS — R748 Abnormal levels of other serum enzymes: Secondary | ICD-10-CM

## 2019-06-22 LAB — BASIC METABOLIC PANEL
BUN: 12 mg/dL (ref 6–23)
CO2: 27 mEq/L (ref 19–32)
Calcium: 8.9 mg/dL (ref 8.4–10.5)
Chloride: 104 mEq/L (ref 96–112)
Creatinine, Ser: 0.65 mg/dL (ref 0.40–1.20)
GFR: 109.99 mL/min (ref >60.00)
Glucose, Bld: 93 mg/dL (ref 70–99)
Potassium: 3.9 mEq/L (ref 3.5–5.1)
Sodium: 138 mEq/L (ref 135–145)

## 2019-06-22 LAB — VITAMIN D 25 HYDROXY (VIT D DEFICIENCY, FRACTURES): VITD: 10.45 ng/mL — ABNORMAL LOW (ref 30.00–100.00)

## 2019-06-22 MED ORDER — LISINOPRIL 10 MG PO TABS: 10.0000 mg | ORAL_TABLET | Freq: Every day | ORAL | 0 refills | Status: DC

## 2019-06-22 NOTE — Progress Notes (Signed)
[name], female   DOB: July 21, 1950, 69 y.o.   MRN: [id number]   Subjective:    [name], female    DOB: 12/30/1950, 69 y.o.   MRN: [id number]  HPI This visit occurred during the SARS-CoV-2 public health emergency.  Safety protocols were in place, including screening questions prior to the visit, additional usage of staff PPE, and extensive cleaning of exam room while observing appropriate contact time as indicated for disinfecting solutions.  Patient here for her physical exam.  Not taking lisinopril. Off.  Had no problems taking.  Just has not had refilled.  Tries to stay active.  Had covid 01/2019.  No residual problems.  No cough, congestion or sob.  No chest pain or tightness.  No acid reflux.  No abdominal pain or cramping.  Bowels stable.  Seeing Dr Janese Banks - [facility].  Still issues with her knees.     Past Medical History:  Diagnosis Date  . Anemia    not currently being treated for this  . Arthritis   . History of colon polyps   . Hypertension    Past Surgical History:  Procedure Laterality Date  . CHOLECYSTECTOMY  1989  . COLONOSCOPY WITH PROPOFOL N/A 04/10/2017   Procedure: COLONOSCOPY WITH PROPOFOL;  Surgeon: Lin Landsman, MD;  Location: [facility] ENDOSCOPY;  Service: Gastroenterology;  Laterality: N/A;  . ESOPHAGOGASTRODUODENOSCOPY (EGD) WITH PROPOFOL N/A 04/10/2017   Procedure: ESOPHAGOGASTRODUODENOSCOPY (EGD) WITH PROPOFOL;  Surgeon: Lin Landsman, MD;  Location: [facility] ENDOSCOPY;  Service: Gastroenterology;  Laterality: N/A;  . GASTRIC BYPASS  03/21/03  . JOINT REPLACEMENT Right 04/2014   TOTAL KNEE REPLACEMENT, DR. Rudene Christians, [place]  . TOTAL KNEE ARTHROPLASTY Left 02/27/2016   Procedure: TOTAL KNEE ARTHROPLASTY;  Surgeon: Hessie Knows, MD;  Location: [facility] ORS;  Service: Orthopedics;  Laterality: Left;  . TUBAL LIGATION  1976   Family History  Problem Relation Age of Onset  . Hypertension Mother   . Diabetes Mother   . Cancer Maternal Aunt    question of type  . Cancer Maternal Grandmother        colon  . Breast cancer Cousin 75       maternal side   Social History   Socioeconomic History  . Marital status: Married    Spouse name: Not on file  . Number of children: 2  . Years of education: Not on file  . Highest education level: Not on file  Occupational History    Employer: [employer]  Tobacco Use  . Smoking status: Never Smoker  . Smokeless tobacco: Never Used  Substance and Sexual Activity  . Alcohol use: No    Alcohol/week: 0.0 standard drinks  . Drug use: No  . Sexual activity: Yes  Other Topics Concern  . Not on file  Social History Narrative  . Not on file   Social Determinants of Health   Financial Resource Strain:   . Difficulty of Paying Living Expenses:   Food Insecurity:   . Worried About Charity fundraiser in the Last Year:   . Arboriculturist in the Last Year:   Transportation Needs:   . Film/video editor (Medical):   Marland Kitchen Lack of Transportation (Non-Medical):   Physical Activity:   . Days of Exercise per Week:   . Minutes of Exercise per Session:   Stress:   . Feeling of Stress :   Social Connections:   . Frequency of Communication with Friends and Family:   .  Frequency of Social Gatherings with Friends and Family:   . Attends Religious Services:   . Active Member of Clubs or Organizations:   . Attends Archivist Meetings:   Marland Kitchen Marital Status:     Outpatient Encounter Medications as of 06/22/2019  Medication Sig  . lisinopril (ZESTRIL) 10 MG tablet Take 1 tablet (10 mg total) by mouth daily.  . vitamin B-12 (CYANOCOBALAMIN) 1000 MCG tablet Take 1,000 mcg by mouth daily.   . Vitamin D, Ergocalciferol, (DRISDOL) 1.25 MG (50000 UNIT) CAPS capsule Take 1 capsule (50,000 Units total) by mouth every 7 (seven) days.  . [DISCONTINUED] lisinopril (PRINIVIL,ZESTRIL) 10 MG tablet Take 1 tablet (10 mg total) by mouth daily.   No facility-administered encounter medications on file as of  06/22/2019.    Review of Systems  Constitutional: Negative for appetite change and unexpected weight change.  HENT: Negative for congestion and sinus pressure.   Eyes: Negative for pain and visual disturbance.  Respiratory: Negative for cough, chest tightness and shortness of breath.   Cardiovascular: Negative for chest pain, palpitations and leg swelling.  Gastrointestinal: Negative for abdominal pain, nausea and vomiting.  Genitourinary: Negative for difficulty urinating and dysuria.  Musculoskeletal: Negative for myalgias.       Knee pain as outlined.    Skin: Negative for color change and rash.  Neurological: Negative for dizziness, light-headedness and headaches.  Hematological: Negative for adenopathy. Does not bruise/bleed easily.  Psychiatric/Behavioral: Negative for agitation and dysphoric mood.       Objective:    Physical Exam Vitals reviewed.  Constitutional:      General: She is not in acute distress.    Appearance: Normal appearance. She is well-developed.  HENT:     Head: Normocephalic and atraumatic.     Right Ear: External ear normal.     Left Ear: External ear normal.  Eyes:     General: No scleral icterus.       Right eye: No discharge.        Left eye: No discharge.     Conjunctiva/sclera: Conjunctivae normal.  Neck:     Thyroid: No thyromegaly.  Cardiovascular:     Rate and Rhythm: Normal rate and regular rhythm.  Pulmonary:     Effort: No tachypnea, accessory muscle usage or respiratory distress.     Breath sounds: Normal breath sounds. No decreased breath sounds or wheezing.  Chest:     Breasts:        Right: No inverted nipple, mass, nipple discharge or tenderness (no axillary adenopathy).        Left: No inverted nipple, mass, nipple discharge or tenderness (no axilarry adenopathy).  Abdominal:     General: Bowel sounds are normal.     Palpations: Abdomen is soft.     Tenderness: There is no abdominal tenderness.  Musculoskeletal:         General: No swelling or tenderness.     Cervical back: Neck supple. No tenderness.  Lymphadenopathy:     Cervical: No cervical adenopathy.  Skin:    Findings: No erythema or rash.  Neurological:     Mental Status: She is alert and oriented to person, place, and time.  Psychiatric:        Mood and Affect: Mood normal.        Behavior: Behavior normal.     BP 140/82   Pulse 78   Temp 97.8 F (36.6 C)   Resp 16   Ht 5\' 5"  (1.651 m)  Wt 236 lb (107 kg)   LMP 04/06/2009   SpO2 98%   BMI 39.27 kg/m  Wt Readings from Last 3 Encounters:  06/22/19 236 lb (107 kg)  02/18/19 230 lb (104.3 kg)  08/18/18 238 lb 1.6 oz (108 kg)     Lab Results  Component Value Date   WBC 5.9 06/18/2019   HGB 13.0 06/18/2019   HCT 38.3 06/18/2019   PLT 174 06/18/2019   GLUCOSE 93 06/22/2019   CHOL 98 03/25/2019   TRIG 72.0 03/25/2019   HDL 44.30 03/25/2019   LDLCALC 39 03/25/2019   ALT 12 03/25/2019   AST 16 03/25/2019   NA 138 06/22/2019   K 3.9 06/22/2019   CL 104 06/22/2019   CREATININE 0.65 06/22/2019   BUN 12 06/22/2019   CO2 27 06/22/2019   TSH 1.26 03/25/2019   INR 1.14 02/21/2016   HGBA1C 5.6 03/25/2019    MM 3D SCREEN BREAST BILATERAL  Result Date: 12/28/2018 CLINICAL DATA:  Screening. EXAM: DIGITAL SCREENING BILATERAL MAMMOGRAM WITH TOMO AND CAD COMPARISON:  Previous exam(s). ACR Breast Density Category b: There are scattered areas of fibroglandular density. FINDINGS: There are no findings suspicious for malignancy. Images were processed with CAD. IMPRESSION: No mammographic evidence of malignancy. A result letter of this screening mammogram will be mailed directly to the patient. RECOMMENDATION: Screening mammogram in one year. (Code:SM-B-01Y) BI-RADS CATEGORY  1: Negative. Electronically Signed   By: Claudie Revering M.D.   On: 12/28/2018 14:00       Assessment & Plan:   Problem List Items Addressed This Visit    Anemia    Has seen GI.  Being followed by hematology.  Has  had iron infusions previously.  Follow cbc.        Elevated alkaline phosphatase level    Being followed by hematology.        Essential hypertension, benign    Blood pressure elevated.  Not taking lisinopril. Restart.  Follow pressures.  Follow metabolic panel.         Relevant Medications   lisinopril (ZESTRIL) 10 MG tablet   Other Relevant Orders   Basic metabolic panel (Completed)   Health care maintenance    Physical today 06/22/19.  Colonoscopy 04/10/17 - tubular adenomatous polyp (transverse colon).  Recommended f/u in 5 years - Dr Marius Ditch.  Mammogram 12/28/18 - Birads I.       Iron deficiency    Has seen hematology.  Has required iron infusions.  Follow cbc and iron studies.        Knee pain, bilateral    OA.  Still some numbness.  Stable. Follow.        Relevant Medications   lisinopril (ZESTRIL) 10 MG tablet   Thrombocytopenia (HCC)    Most recent check - platelet count wnl.       Vitamin D deficiency   Relevant Orders   VITAMIN D 25 Hydroxy (Vit-D Deficiency, Fractures) (Completed)    Other Visit Diagnoses    Routine general medical examination at a health care facility    -  Primary       Einar Pheasant, MD

## 2019-06-27 ENCOUNTER — Encounter: Payer: Self-pay | Admitting: Internal Medicine

## 2019-06-27 NOTE — Assessment & Plan Note (Addendum)
Blood pressure elevated.  Not taking lisinopril. Restart.  Follow pressures.  Follow metabolic panel.

## 2019-06-27 NOTE — Assessment & Plan Note (Signed)
Most recent check - platelet count wnl.

## 2019-06-27 NOTE — Assessment & Plan Note (Addendum)
Physical today 06/22/19.  Colonoscopy 04/10/17 - tubular adenomatous polyp (transverse colon).  Recommended f/u in 5 years - Dr Marius Ditch.  Mammogram 12/28/18 - Birads I.

## 2019-06-27 NOTE — Assessment & Plan Note (Signed)
OA.  Still some numbness.  Stable. Follow.

## 2019-06-27 NOTE — Assessment & Plan Note (Signed)
Being followed by hematology.   

## 2019-06-27 NOTE — Assessment & Plan Note (Signed)
Has seen GI.  Being followed by hematology.  Has had iron infusions previously.  Follow cbc.   

## 2019-06-27 NOTE — Assessment & Plan Note (Signed)
Has seen hematology.  Has required iron infusions.  Follow cbc and iron studies.

## 2019-07-01 ENCOUNTER — Other Ambulatory Visit: Payer: Self-pay

## 2019-07-01 MED ORDER — VITAMIN D (ERGOCALCIFEROL) 1.25 MG (50000 UNIT) PO CAPS: 50000.0000 [IU] | ORAL_CAPSULE | ORAL | 2 refills | Status: DC

## 2019-07-27 ENCOUNTER — Ambulatory Visit (INDEPENDENT_AMBULATORY_CARE_PROVIDER_SITE_OTHER): Payer: Medicare Other | Admitting: Internal Medicine

## 2019-07-27 ENCOUNTER — Other Ambulatory Visit: Payer: Self-pay

## 2019-07-27 VITALS — BP 126/72 | HR 75 | Temp 97.4°F | Resp 16 | Ht 65.0 in | Wt 234.0 lb

## 2019-07-27 DIAGNOSIS — E538 Deficiency of other specified B group vitamins: Secondary | ICD-10-CM | POA: Diagnosis not present

## 2019-07-27 DIAGNOSIS — E611 Iron deficiency: Secondary | ICD-10-CM

## 2019-07-27 DIAGNOSIS — E559 Vitamin D deficiency, unspecified: Secondary | ICD-10-CM | POA: Diagnosis not present

## 2019-07-27 DIAGNOSIS — R748 Abnormal levels of other serum enzymes: Secondary | ICD-10-CM

## 2019-07-27 DIAGNOSIS — I10 Essential (primary) hypertension: Secondary | ICD-10-CM

## 2019-07-27 DIAGNOSIS — D696 Thrombocytopenia, unspecified: Secondary | ICD-10-CM

## 2019-07-27 MED ORDER — CYANOCOBALAMIN 1000 MCG/ML IJ SOLN
1000.0000 ug | Freq: Once | INTRAMUSCULAR | Status: AC
  Administered 2019-07-27: 1000 ug via INTRAMUSCULAR

## 2019-07-27 NOTE — Progress Notes (Signed)
[name], female   DOB: Feb 03, 1950, 69 y.o.   MRN: [id number]   Subjective:    [name], female    DOB: May 08, 1950, 69 y.o.   MRN: [id number]  HPI This visit occurred during the SARS-CoV-2 public health emergency.  Safety protocols were in place, including screening questions prior to the visit, additional usage of staff PPE, and extensive cleaning of exam room while observing appropriate contact time as indicated for disinfecting solutions.  Patient here for a scheduled follow up.  She is accompanied by her daughter. History obtained mostly from the patient.  She reports she is doing better.  Blood pressure better.  Taking her medication regularly.  Has cut down her soda intake.  No chest pain or sob reported.  No abdominal pain or bowel change reported.  Discussed labs.  Discussed vitamin D supplements and B12 supplements.    Past Medical History:  Diagnosis Date  . Anemia    not currently being treated for this  . Arthritis   . History of colon polyps   . Hypertension    Past Surgical History:  Procedure Laterality Date  . CHOLECYSTECTOMY  1989  . COLONOSCOPY WITH PROPOFOL N/A 04/10/2017   Procedure: COLONOSCOPY WITH PROPOFOL;  Surgeon: Lin Landsman, MD;  Location: [facility] ENDOSCOPY;  Service: Gastroenterology;  Laterality: N/A;  . ESOPHAGOGASTRODUODENOSCOPY (EGD) WITH PROPOFOL N/A 04/10/2017   Procedure: ESOPHAGOGASTRODUODENOSCOPY (EGD) WITH PROPOFOL;  Surgeon: Lin Landsman, MD;  Location: [facility] ENDOSCOPY;  Service: Gastroenterology;  Laterality: N/A;  . GASTRIC BYPASS  03/21/03  . JOINT REPLACEMENT Right 04/2014   TOTAL KNEE REPLACEMENT, DR. Rudene Christians, [place]  . TOTAL KNEE ARTHROPLASTY Left 02/27/2016   Procedure: TOTAL KNEE ARTHROPLASTY;  Surgeon: Hessie Knows, MD;  Location: [facility] ORS;  Service: Orthopedics;  Laterality: Left;  . TUBAL LIGATION  1976   Family History  Problem Relation Age of Onset  . Hypertension Mother   . Diabetes Mother   .  Cancer Maternal Aunt        question of type  . Cancer Maternal Grandmother        colon  . Breast cancer Cousin 52       maternal side   Social History   Socioeconomic History  . Marital status: Married    Spouse name: Not on file  . Number of children: 2  . Years of education: Not on file  . Highest education level: Not on file  Occupational History    Employer: [employer]  Tobacco Use  . Smoking status: Never Smoker  . Smokeless tobacco: Never Used  Vaping Use  . Vaping Use: Never used  Substance and Sexual Activity  . Alcohol use: No    Alcohol/week: 0.0 standard drinks  . Drug use: No  . Sexual activity: Yes  Other Topics Concern  . Not on file  Social History Narrative  . Not on file   Social Determinants of Health   Financial Resource Strain:   . Difficulty of Paying Living Expenses:   Food Insecurity:   . Worried About Charity fundraiser in the Last Year:   . Arboriculturist in the Last Year:   Transportation Needs:   . Film/video editor (Medical):   Marland Kitchen Lack of Transportation (Non-Medical):   Physical Activity:   . Days of Exercise per Week:   . Minutes of Exercise per Session:   Stress:   . Feeling of Stress :   Social Connections:   .  Frequency of Communication with Friends and Family:   . Frequency of Social Gatherings with Friends and Family:   . Attends Religious Services:   . Active Member of Clubs or Organizations:   . Attends Archivist Meetings:   Marland Kitchen Marital Status:     Outpatient Encounter Medications as of 07/27/2019  Medication Sig  . lisinopril (ZESTRIL) 10 MG tablet Take 1 tablet (10 mg total) by mouth daily.  . vitamin B-12 (CYANOCOBALAMIN) 1000 MCG tablet Take 1,000 mcg by mouth daily.   . Vitamin D, Ergocalciferol, (DRISDOL) 1.25 MG (50000 UNIT) CAPS capsule Take 1 capsule (50,000 Units total) by mouth every 7 (seven) days.  . [EXPIRED] cyanocobalamin ((VITAMIN B-12)) injection 1,000 mcg    No facility-administered  encounter medications on file as of 07/27/2019.    Review of Systems  Constitutional: Negative for appetite change and unexpected weight change.  HENT: Negative for congestion and sinus pressure.   Respiratory: Negative for cough, chest tightness and shortness of breath.   Cardiovascular: Negative for chest pain, palpitations and leg swelling.  Gastrointestinal: Negative for abdominal pain, diarrhea, nausea and vomiting.  Genitourinary: Negative for difficulty urinating and dysuria.  Musculoskeletal: Negative for joint swelling and myalgias.  Skin: Negative for color change and rash.  Neurological: Negative for dizziness, light-headedness and headaches.  Psychiatric/Behavioral: Negative for agitation and dysphoric mood.       Objective:    Physical Exam Vitals reviewed.  Constitutional:      General: She is not in acute distress.    Appearance: Normal appearance.  HENT:     Head: Normocephalic and atraumatic.     Right Ear: External ear normal.     Left Ear: External ear normal.  Eyes:     General: No scleral icterus.       Right eye: No discharge.        Left eye: No discharge.     Conjunctiva/sclera: Conjunctivae normal.  Neck:     Thyroid: No thyromegaly.  Cardiovascular:     Rate and Rhythm: Normal rate and regular rhythm.  Pulmonary:     Effort: No respiratory distress.     Breath sounds: Normal breath sounds. No wheezing.  Abdominal:     General: Bowel sounds are normal.     Palpations: Abdomen is soft.     Tenderness: There is no abdominal tenderness.  Musculoskeletal:        General: No swelling or tenderness.     Cervical back: Neck supple. No tenderness.  Lymphadenopathy:     Cervical: No cervical adenopathy.  Skin:    Findings: No erythema or rash.  Neurological:     Mental Status: She is alert.  Psychiatric:        Mood and Affect: Mood normal.        Behavior: Behavior normal.     BP 126/72   Pulse 75   Temp (!) 97.4 F (36.3 C)   Resp 16    Ht 5\' 5"  (1.651 m)   Wt 234 lb (106.1 kg)   LMP 04/06/2009   SpO2 98%   BMI 38.94 kg/m  Wt Readings from Last 3 Encounters:  07/27/19 234 lb (106.1 kg)  06/22/19 236 lb (107 kg)  02/18/19 230 lb (104.3 kg)     Lab Results  Component Value Date   WBC 5.9 06/18/2019   HGB 13.0 06/18/2019   HCT 38.3 06/18/2019   PLT 174 06/18/2019   GLUCOSE 93 06/22/2019   CHOL 98 03/25/2019  TRIG 72.0 03/25/2019   HDL 44.30 03/25/2019   LDLCALC 39 03/25/2019   ALT 12 03/25/2019   AST 16 03/25/2019   NA 138 06/22/2019   K 3.9 06/22/2019   CL 104 06/22/2019   CREATININE 0.65 06/22/2019   BUN 12 06/22/2019   CO2 27 06/22/2019   TSH 1.26 03/25/2019   INR 1.14 02/21/2016   HGBA1C 5.6 03/25/2019    MM 3D SCREEN BREAST BILATERAL  Result Date: 12/28/2018 CLINICAL DATA:  Screening. EXAM: DIGITAL SCREENING BILATERAL MAMMOGRAM WITH TOMO AND CAD COMPARISON:  Previous exam(s). ACR Breast Density Category b: There are scattered areas of fibroglandular density. FINDINGS: There are no findings suspicious for malignancy. Images were processed with CAD. IMPRESSION: No mammographic evidence of malignancy. A result letter of this screening mammogram will be mailed directly to the patient. RECOMMENDATION: Screening mammogram in one year. (Code:SM-B-01Y) BI-RADS CATEGORY  1: Negative. Electronically Signed   By: Claudie Revering M.D.   On: 12/28/2018 14:00       Assessment & Plan:   Problem List Items Addressed This Visit    B12 deficiency - Primary    b12 injections as outlined.        Elevated alkaline phosphatase level    Being followed by hematology.  Follow liver function tests.        Essential hypertension, benign    Blood pressure doing better.  Taking lisinopril regularly.  Follow pressures.  Follow metabolic panel.       Iron deficiency    Has seen hematology.  Follow cbc and iron studies.       Thrombocytopenia ([place])    Follow cbc.       Vitamin D deficiency    Vitamin d supplements  as outlined.  Prescription vitamin D for 3 months and then otc vitamin D.           Einar Pheasant, MD

## 2019-07-27 NOTE — Patient Instructions (Addendum)
After you have completed the prescription Vitamin D, then start vitamin  D3 1000 units per day.

## 2019-08-01 ENCOUNTER — Encounter: Payer: Self-pay | Admitting: Internal Medicine

## 2019-08-02 DIAGNOSIS — E538 Deficiency of other specified B group vitamins: Secondary | ICD-10-CM | POA: Insufficient documentation

## 2019-08-02 NOTE — Assessment & Plan Note (Signed)
b12 injections as outlined.

## 2019-08-02 NOTE — Assessment & Plan Note (Signed)
Has seen hematology.  Follow cbc and iron studies.  

## 2019-08-02 NOTE — Assessment & Plan Note (Signed)
Blood pressure doing better.  Taking lisinopril regularly.  Follow pressures.  Follow metabolic panel.

## 2019-08-02 NOTE — Assessment & Plan Note (Signed)
Follow cbc.  

## 2019-08-02 NOTE — Assessment & Plan Note (Signed)
Being followed by hematology.  Follow liver function tests.

## 2019-08-02 NOTE — Assessment & Plan Note (Signed)
Vitamin d supplements as outlined.  Prescription vitamin D for 3 months and then otc vitamin D.

## 2019-08-04 ENCOUNTER — Ambulatory Visit: Payer: Medicare Other

## 2019-08-12 ENCOUNTER — Other Ambulatory Visit: Payer: Self-pay

## 2019-08-12 ENCOUNTER — Ambulatory Visit (INDEPENDENT_AMBULATORY_CARE_PROVIDER_SITE_OTHER): Payer: Medicare Other

## 2019-08-12 DIAGNOSIS — E538 Deficiency of other specified B group vitamins: Secondary | ICD-10-CM

## 2019-08-12 MED ORDER — CYANOCOBALAMIN 1000 MCG/ML IJ SOLN
1000.0000 ug | Freq: Once | INTRAMUSCULAR | Status: AC
Start: 2019-08-12 — End: 2019-08-12
  Administered 2019-08-12: 1000 ug via INTRAMUSCULAR

## 2019-08-12 NOTE — Progress Notes (Addendum)
Patient presented for B 12 injection to left deltoid, patient voiced no concerns nor showed any signs of distress during injection.  Reviewed.  Dr Scott 

## 2019-08-18 ENCOUNTER — Ambulatory Visit (INDEPENDENT_AMBULATORY_CARE_PROVIDER_SITE_OTHER): Payer: Medicare Other

## 2019-08-18 ENCOUNTER — Other Ambulatory Visit: Payer: Self-pay

## 2019-08-18 DIAGNOSIS — E538 Deficiency of other specified B group vitamins: Secondary | ICD-10-CM | POA: Diagnosis not present

## 2019-08-18 MED ORDER — CYANOCOBALAMIN 1000 MCG/ML IJ SOLN
1000.0000 ug | Freq: Once | INTRAMUSCULAR | Status: AC
Start: 2019-08-18 — End: 2019-08-18
  Administered 2019-08-18: 1000 ug via INTRAMUSCULAR

## 2019-08-18 NOTE — Progress Notes (Addendum)
Patient presented for B 12 injection to left deltoid, patient voiced no concerns nor showed any signs of distress during injection.  Reviewed.  Dr Scott 

## 2019-08-31 ENCOUNTER — Ambulatory Visit: Payer: Medicare Other

## 2019-09-01 ENCOUNTER — Ambulatory Visit: Payer: Medicare Other

## 2019-10-19 ENCOUNTER — Other Ambulatory Visit: Payer: Self-pay

## 2019-10-19 ENCOUNTER — Inpatient Hospital Stay: Payer: Medicare Other | Attending: Oncology

## 2019-10-19 DIAGNOSIS — D509 Iron deficiency anemia, unspecified: Secondary | ICD-10-CM

## 2019-10-19 DIAGNOSIS — E538 Deficiency of other specified B group vitamins: Secondary | ICD-10-CM

## 2019-10-19 LAB — CBC WITH DIFFERENTIAL/PLATELET
Abs Immature Granulocytes: 0.01 10*3/uL (ref 0.00–0.07)
Basophils Absolute: 0 10*3/uL (ref 0.0–0.1)
Basophils Relative: 0 %
Eosinophils Absolute: 0.2 10*3/uL (ref 0.0–0.5)
Eosinophils Relative: 3 %
HCT: 38 % (ref 36.0–46.0)
Hemoglobin: 13.1 g/dL (ref 12.0–15.0)
Immature Granulocytes: 0 %
Lymphocytes Relative: 39 %
Lymphs Abs: 2.3 10*3/uL (ref 0.7–4.0)
MCH: 31.1 pg (ref 26.0–34.0)
MCHC: 34.5 g/dL (ref 30.0–36.0)
MCV: 90.3 fL (ref 80.0–100.0)
Monocytes Absolute: 0.4 10*3/uL (ref 0.1–1.0)
Monocytes Relative: 6 %
Neutro Abs: 3 10*3/uL (ref 1.7–7.7)
Neutrophils Relative %: 52 %
Platelets: 171 10*3/uL (ref 150–400)
RBC: 4.21 MIL/uL (ref 3.87–5.11)
RDW: 12.5 % (ref 11.5–15.5)
WBC: 5.9 10*3/uL (ref 4.0–10.5)
nRBC: 0 % (ref 0.0–0.2)

## 2019-10-19 LAB — FERRITIN: Ferritin: 202 ng/mL (ref 11–307)

## 2019-10-19 LAB — IRON AND TIBC
Iron: 96 ug/dL (ref 28–170)
Saturation Ratios: 33 % — ABNORMAL HIGH (ref 10.4–31.8)
TIBC: 293 ug/dL (ref 250–450)
UIBC: 197 ug/dL

## 2019-10-19 LAB — VITAMIN B12: Vitamin B-12: 223 pg/mL (ref 180–914)

## 2019-10-21 ENCOUNTER — Inpatient Hospital Stay (HOSPITAL_BASED_OUTPATIENT_CLINIC_OR_DEPARTMENT_OTHER): Payer: Medicare Other | Admitting: Oncology

## 2019-10-21 ENCOUNTER — Encounter: Payer: Self-pay | Admitting: Oncology

## 2019-10-21 ENCOUNTER — Other Ambulatory Visit: Payer: Self-pay

## 2019-10-21 DIAGNOSIS — D509 Iron deficiency anemia, unspecified: Secondary | ICD-10-CM

## 2019-10-21 DIAGNOSIS — E538 Deficiency of other specified B group vitamins: Secondary | ICD-10-CM

## 2019-10-26 NOTE — Progress Notes (Signed)
I connected with Kelly Wallace on 10/26/19 at  2:00 PM EDT by video enabled telemedicine visit and verified that I am speaking with the correct person using two identifiers.   I discussed the limitations, risks, security and privacy concerns of performing an evaluation and management service by telemedicine and the availability of in-person appointments. I also discussed with the patient that there may be a patient responsible charge related to this service. The patient expressed understanding and agreed to proceed.  Other persons participating in the visit and their role in the encounter:  none  Patient's location:  home Provider's location:  work  Risk analyst Complaint:  Routine f/u of iron deficiency anemia  History of present illness: patient is a 69 year oldobese African-Americanfemale who has been referred to [place] for evaluation and management of anemia. She has a history of gastric bypass surgery back in 2005. She is not currently taking any iron supplements. Reports possible history of colon cancer in her maternal grandmother but she is not sure.  She is postmenopausal  Recent CBC from 02/26/2017 showed white count of 5, H&H of 9.5/30.4 with an MCV of 78.7 and a platelet count of 232. Ferritin levels were low at 4.9. Of note ferritin was low at 7.45 months ago as well.B12 levels were low at 204. TSH was normal. On review of her prior CBCs her hemoglobin has been between 9-10 for the last 1 year. Borderline low MCV levels.No other cytopenias. Last colonoscopy in June 2013 showed internal hemorrhoids but no other abnormal findings.pateint had repeat EGD and colonoscopy inmarch 2019 which did not reveal any bleeding.s he received 2 doses of feraheme in march 2019   Interval history patient reports doing well. Denies any blood loss in her stool or urine. Reports mild fatigue   Review of Systems  Constitutional: Positive for malaise/fatigue. Negative for chills, fever and weight  loss.  HENT: Negative for congestion, ear discharge and nosebleeds.   Eyes: Negative for blurred vision.  Respiratory: Negative for cough, hemoptysis, sputum production, shortness of breath and wheezing.   Cardiovascular: Negative for chest pain, palpitations, orthopnea and claudication.  Gastrointestinal: Negative for abdominal pain, blood in stool, constipation, diarrhea, heartburn, melena, nausea and vomiting.  Genitourinary: Negative for dysuria, flank pain, frequency, hematuria and urgency.  Musculoskeletal: Negative for back pain, joint pain and myalgias.  Skin: Negative for rash.  Neurological: Negative for dizziness, tingling, focal weakness, seizures, weakness and headaches.  Endo/Heme/Allergies: Does not bruise/bleed easily.  Psychiatric/Behavioral: Negative for depression and suicidal ideas. The patient does not have insomnia.     No Known Allergies  Past Medical History:  Diagnosis Date  . Anemia    not currently being treated for this  . Arthritis   . History of colon polyps   . Hypertension     Past Surgical History:  Procedure Laterality Date  . CHOLECYSTECTOMY  1989  . COLONOSCOPY WITH PROPOFOL N/A 04/10/2017   Procedure: COLONOSCOPY WITH PROPOFOL;  Surgeon: Lin Landsman, MD;  Location: [facility] ENDOSCOPY;  Service: Gastroenterology;  Laterality: N/A;  . ESOPHAGOGASTRODUODENOSCOPY (EGD) WITH PROPOFOL N/A 04/10/2017   Procedure: ESOPHAGOGASTRODUODENOSCOPY (EGD) WITH PROPOFOL;  Surgeon: Lin Landsman, MD;  Location: [facility] ENDOSCOPY;  Service: Gastroenterology;  Laterality: N/A;  . GASTRIC BYPASS  03/21/03  . JOINT REPLACEMENT Right 04/2014   TOTAL KNEE REPLACEMENT, DR. Rudene Christians, [place]  . TOTAL KNEE ARTHROPLASTY Left 02/27/2016   Procedure: TOTAL KNEE ARTHROPLASTY;  Surgeon: Hessie Knows, MD;  Location: [facility] ORS;  Service: Orthopedics;  Laterality: Left;  .  TUBAL LIGATION  1976    Social History   Socioeconomic History  . Marital status: Married    Spouse name:  Not on file  . Number of children: 2  . Years of education: Not on file  . Highest education level: Not on file  Occupational History    Employer: [employer]  Tobacco Use  . Smoking status: Never Smoker  . Smokeless tobacco: Never Used  Vaping Use  . Vaping Use: Never used  Substance and Sexual Activity  . Alcohol use: No    Alcohol/week: 0.0 standard drinks  . Drug use: No  . Sexual activity: Yes  Other Topics Concern  . Not on file  Social History Narrative  . Not on file   Social Determinants of Health   Financial Resource Strain:   . Difficulty of Paying Living Expenses: Not on file  Food Insecurity:   . Worried About Charity fundraiser in the Last Year: Not on file  . Ran Out of Food in the Last Year: Not on file  Transportation Needs:   . Lack of Transportation (Medical): Not on file  . Lack of Transportation (Non-Medical): Not on file  Physical Activity:   . Days of Exercise per Week: Not on file  . Minutes of Exercise per Session: Not on file  Stress:   . Feeling of Stress : Not on file  Social Connections:   . Frequency of Communication with Friends and Family: Not on file  . Frequency of Social Gatherings with Friends and Family: Not on file  . Attends Religious Services: Not on file  . Active Member of Clubs or Organizations: Not on file  . Attends Archivist Meetings: Not on file  . Marital Status: Not on file  Intimate Partner Violence:   . Fear of Current or Ex-Partner: Not on file  . Emotionally Abused: Not on file  . Physically Abused: Not on file  . Sexually Abused: Not on file    Family History  Problem Relation Age of Onset  . Hypertension Mother   . Diabetes Mother   . Cancer Maternal Aunt        question of type  . Cancer Maternal Grandmother        colon  . Breast cancer Cousin 44       maternal side     Current Outpatient Medications:  .  lisinopril (ZESTRIL) 10 MG tablet, Take 1 tablet (10 mg total) by mouth daily., Disp: 90  tablet, Rfl: 0 .  vitamin B-12 (CYANOCOBALAMIN) 1000 MCG tablet, Take 1,000 mcg by mouth daily. , Disp: , Rfl:  .  Vitamin D, Ergocalciferol, (DRISDOL) 1.25 MG (50000 UNIT) CAPS capsule, Take 1 capsule (50,000 Units total) by mouth every 7 (seven) days., Disp: 5 capsule, Rfl: 2  No results found.  No images are attached to the encounter.   CMP Latest Ref Rng & Units 06/22/2019  Glucose 70 - 99 mg/dL 93  BUN 6 - 23 mg/dL 12  Creatinine 0.40 - 1.20 mg/dL 0.65  Sodium 135 - 145 mEq/L 138  Potassium 3.5 - 5.1 mEq/L 3.9  Chloride 96 - 112 mEq/L 104  CO2 19 - 32 mEq/L 27  Calcium 8.4 - 10.5 mg/dL 8.9  Total Protein 6.0 - 8.3 g/dL -  Total Bilirubin 0.2 - 1.2 mg/dL -  Alkaline Phos 39 - 117 U/L -  AST 0 - 37 U/L -  ALT 0 - 35 U/L -   CBC Latest Ref  Rng & Units 10/19/2019  WBC 4.0 - 10.5 K/uL 5.9  Hemoglobin 12.0 - 15.0 g/dL 13.1  Hematocrit 36 - 46 % 38.0  Platelets 150 - 400 K/uL 171     Observation/objective: Appears in no acute distress over video visit today. Breathing is nonlabored  Assessment and plan: Patient is a 69 year old female with history of iron deficiency anemia and this is a routine follow-up visit  Patient is not presently anemic with a hemoglobin of 13.1. Ferritin is normal at 202 with normal iron studies. B12 levels are low at 223. We have asked her to take oral B12 1000 mcg daily. Repeat CBC with differential ferritin and iron studies in 6 months in 1 year and I will see her back in 1 year. Patient has now not required IV iron for over 2 years she has been getting intermittent B12 shots as well  Follow-up instructions:as above  I discussed the assessment and treatment plan with the patient. The patient was provided an opportunity to ask questions and all were answered. The patient agreed with the plan and demonstrated an understanding of the instructions.   The patient was advised to call back or seek an in-person evaluation if the symptoms worsen or if the  condition fails to improve as anticipated.   Visit Diagnosis: 1. Iron deficiency anemia, unspecified iron deficiency anemia type   2. B12 deficiency     Dr. Randa Evens, MD, MPH [facility] Tel- [phone] 10/26/2019 9:43 AM

## 2019-11-04 ENCOUNTER — Telehealth: Payer: Medicare Other | Admitting: Internal Medicine

## 2019-11-04 ENCOUNTER — Other Ambulatory Visit: Payer: Self-pay

## 2019-11-04 DIAGNOSIS — I10 Essential (primary) hypertension: Secondary | ICD-10-CM

## 2019-11-07 ENCOUNTER — Encounter: Payer: Self-pay | Admitting: Internal Medicine

## 2019-11-07 NOTE — Progress Notes (Signed)
[name], female   DOB: [birth year], 69 y.o.   MRN: [id number] Pt did not show (or connect for appt).

## 2019-12-06 ENCOUNTER — Other Ambulatory Visit: Payer: Self-pay | Admitting: Internal Medicine

## 2019-12-06 DIAGNOSIS — Z1231 Encounter for screening mammogram for malignant neoplasm of breast: Secondary | ICD-10-CM

## 2019-12-30 ENCOUNTER — Other Ambulatory Visit: Payer: Self-pay

## 2019-12-30 ENCOUNTER — Ambulatory Visit
Admission: RE | Admit: 2019-12-30 | Discharge: 2019-12-30 | Disposition: A | Discharge status: Home / Self Care | Payer: Medicare Other | Source: Ambulatory Visit | Attending: Internal Medicine | Admitting: Internal Medicine

## 2019-12-30 DIAGNOSIS — Z1231 Encounter for screening mammogram for malignant neoplasm of breast: Secondary | ICD-10-CM | POA: Diagnosis not present

## 2020-01-07 ENCOUNTER — Ambulatory Visit (INDEPENDENT_AMBULATORY_CARE_PROVIDER_SITE_OTHER): Payer: Medicare Other

## 2020-01-07 VITALS — Ht 64.0 in | Wt 234.0 lb

## 2020-01-07 DIAGNOSIS — Z0000 Encounter for general adult medical examination without abnormal findings: Secondary | ICD-10-CM

## 2020-01-07 NOTE — Patient Instructions (Addendum)
Kelly Wallace , Thank you for taking time to come for your Medicare Wellness Visit. I appreciate your ongoing commitment to your health goals. Please review the following plan we discussed and let me know if I can assist you in the future.   These are the goals we discussed: Goals    . Follow up with Provider as scheduled       This is a list of the screening recommended for you and due dates:  Health Maintenance  Topic Date Due  . DEXA scan (bone density measurement)  01/06/2021*  . Tetanus Vaccine  01/06/2021*  . Pneumonia vaccines (1 of 2 - PCV13) 01/06/2021*  . Mammogram  12/29/2020  . Colon Cancer Screening  04/11/2022  . Flu Shot  Completed  . COVID-19 Vaccine  Completed  .  Hepatitis C: One time screening is recommended by Center for Disease Control  (CDC) for  adults born from [birth year] through 1963.   Completed  *Topic was postponed. The date shown is not the original due date.    Immunizations Immunization History  Administered Date(s) Administered  . Influenza Split 10/27/2012, 10/21/2013  . Moderna Sars-Covid-2 Vaccination 02/24/2019, 03/25/2019   Advanced directives: Not yet completed. In progress of completion.   Conditions/risks identified: None new.   Follow up in one year for your annual wellness visit.   Preventive Care 74 Years and Older, Female Preventive care refers to lifestyle choices and visits with your health care provider that can promote health and wellness. What does preventive care include?  A yearly physical exam. This is also called an annual well check.  Dental exams once or twice a year.  Routine eye exams. Ask your health care provider how often you should have your eyes checked.  Personal lifestyle choices, including:  Daily care of your teeth and gums.  Regular physical activity.  Eating a healthy diet.  Avoiding tobacco and drug use.  Limiting alcohol use.  Practicing safe sex.  Taking low-dose aspirin every day.  Taking  vitamin and mineral supplements as recommended by your health care provider. What happens during an annual well check? The services and screenings done by your health care provider during your annual well check will depend on your age, overall health, lifestyle risk factors, and family history of disease. Counseling  Your health care provider may ask you questions about your:  Alcohol use.  Tobacco use.  Drug use.  Emotional well-being.  Home and relationship well-being.  Sexual activity.  Eating habits.  History of falls.  Memory and ability to understand (cognition).  Work and work Statistician.  Reproductive health. Screening  You may have the following tests or measurements:  Height, weight, and BMI.  Blood pressure.  Lipid and cholesterol levels. These may be checked every 5 years, or more frequently if you are over 25 years old.  Skin check.  Lung cancer screening. You may have this screening every year starting at age 41 if you have a 30-pack-year history of smoking and currently smoke or have quit within the past 15 years.  Fecal occult blood test (FOBT) of the stool. You may have this test every year starting at age 36.  Flexible sigmoidoscopy or colonoscopy. You may have a sigmoidoscopy every 5 years or a colonoscopy every 10 years starting at age 81.  Hepatitis C blood test.  Hepatitis B blood test.  Sexually transmitted disease (STD) testing.  Diabetes screening. This is done by checking your blood sugar (glucose) after you have not  eaten for a while (fasting). You may have this done every 1-3 years.  Bone density scan. This is done to screen for osteoporosis. You may have this done starting at age 86.  Mammogram. This may be done every 1-2 years. Talk to your health care provider about how often you should have regular mammograms. Talk with your health care provider about your test results, treatment options, and if necessary, the need for more  tests. Vaccines  Your health care provider may recommend certain vaccines, such as:  Influenza vaccine. This is recommended every year.  Tetanus, diphtheria, and acellular pertussis (Tdap, Td) vaccine. You may need a Td booster every 10 years.  Zoster vaccine. You may need this after age 59.  Pneumococcal 13-valent conjugate (PCV13) vaccine. One dose is recommended after age 40.  Pneumococcal polysaccharide (PPSV23) vaccine. One dose is recommended after age 43. Talk to your health care provider about which screenings and vaccines you need and how often you need them. This information is not intended to replace advice given to you by your health care provider. Make sure you discuss any questions you have with your health care provider. Document Released: 02/03/2015 Document Revised: 09/27/2015 Document Reviewed: 11/08/2014 Elsevier Interactive Patient Education  2017 [place] Prevention in the Home Falls can cause injuries. They can happen to people of all ages. There are many things you can do to make your home safe and to help prevent falls. What can I do on the outside of my home?  Regularly fix the edges of walkways and driveways and fix any cracks.  Remove anything that might make you trip as you walk through a door, such as a raised step or threshold.  Trim any bushes or trees on the path to your home.  Use bright outdoor lighting.  Clear any walking paths of anything that might make someone trip, such as rocks or tools.  Regularly check to see if handrails are loose or broken. Make sure that both sides of any steps have handrails.  Any raised decks and porches should have guardrails on the edges.  Have any leaves, snow, or ice cleared regularly.  Use sand or salt on walking paths during winter.  Clean up any spills in your garage right away. This includes oil or grease spills. What can I do in the bathroom?  Use night lights.  Install grab bars by the  toilet and in the tub and shower. Do not use towel bars as grab bars.  Use non-skid mats or decals in the tub or shower.  If you need to sit down in the shower, use a plastic, non-slip stool.  Keep the floor dry. Clean up any water that spills on the floor as soon as it happens.  Remove soap buildup in the tub or shower regularly.  Attach bath mats securely with double-sided non-slip rug tape.  Do not have throw rugs and other things on the floor that can make you trip. What can I do in the bedroom?  Use night lights.  Make sure that you have a light by your bed that is easy to reach.  Do not use any sheets or blankets that are too big for your bed. They should not hang down onto the floor.  Have a firm chair that has side arms. You can use this for support while you get dressed.  Do not have throw rugs and other things on the floor that can make you trip. What can I  do in the kitchen?  Clean up any spills right away.  Avoid walking on wet floors.  Keep items that you use a lot in easy-to-reach places.  If you need to reach something above you, use a strong step stool that has a grab bar.  Keep electrical cords out of the way.  Do not use floor polish or wax that makes floors slippery. If you must use wax, use non-skid floor wax.  Do not have throw rugs and other things on the floor that can make you trip. What can I do with my stairs?  Do not leave any items on the stairs.  Make sure that there are handrails on both sides of the stairs and use them. Fix handrails that are broken or loose. Make sure that handrails are as long as the stairways.  Check any carpeting to make sure that it is firmly attached to the stairs. Fix any carpet that is loose or worn.  Avoid having throw rugs at the top or bottom of the stairs. If you do have throw rugs, attach them to the floor with carpet tape.  Make sure that you have a light switch at the top of the stairs and the bottom of  the stairs. If you do not have them, ask someone to add them for you. What else can I do to help prevent falls?  Wear shoes that:  Do not have high heels.  Have rubber bottoms.  Are comfortable and fit you well.  Are closed at the toe. Do not wear sandals.  If you use a stepladder:  Make sure that it is fully opened. Do not climb a closed stepladder.  Make sure that both sides of the stepladder are locked into place.  Ask someone to hold it for you, if possible.  Clearly mark and make sure that you can see:  Any grab bars or handrails.  First and last steps.  Where the edge of each step is.  Use tools that help you move around (mobility aids) if they are needed. These include:  Canes.  Walkers.  Scooters.  Crutches.  Turn on the lights when you go into a dark area. Replace any light bulbs as soon as they burn out.  Set up your furniture so you have a clear path. Avoid moving your furniture around.  If any of your floors are uneven, fix them.  If there are any pets around you, be aware of where they are.  Review your medicines with your doctor. Some medicines can make you feel dizzy. This can increase your chance of falling. Ask your doctor what other things that you can do to help prevent falls. This information is not intended to replace advice given to you by your health care provider. Make sure you discuss any questions you have with your health care provider. Document Released: 11/03/2008 Document Revised: 06/15/2015 Document Reviewed: 02/11/2014 Elsevier Interactive Patient Education  2017 [employer].

## 2020-01-07 NOTE — Progress Notes (Signed)
Subjective:   Kelly Wallace is a 69 y.o. female who presents for Medicare Annual (Subsequent) preventive examination.  Review of Systems    No ROS.  Medicare Wellness Virtual Visit.    Cardiac Risk Factors include: advanced age (>43men, >28 women);hypertension     Objective:    Today's Vitals   01/07/20 1036  Weight: 234 lb (106.1 kg)  Height: 5\' 4"  (1.626 m)   Body mass index is 40.17 kg/m.  Advanced Directives 01/07/2020 10/21/2019 02/17/2019 01/06/2019 08/18/2018 04/10/2017 03/11/2017  Does Patient Have a Medical Advance Directive? No No No No No No No  Would patient like information on creating a medical advance directive? No - Patient declined No - Patient declined No - Patient declined Yes (MAU/Ambulatory/Procedural Areas - Information given) No - Patient declined No - Patient declined -  Some encounter information is confidential and restricted. Go to Review Flowsheets activity to see all data.    Current Medications (verified) Outpatient Encounter Medications as of 01/07/2020  Medication Sig  . lisinopril (ZESTRIL) 10 MG tablet Take 1 tablet (10 mg total) by mouth daily.  . vitamin B-12 (CYANOCOBALAMIN) 1000 MCG tablet Take 1,000 mcg by mouth daily.   . Vitamin D, Ergocalciferol, (DRISDOL) 1.25 MG (50000 UNIT) CAPS capsule Take 1 capsule (50,000 Units total) by mouth every 7 (seven) days.   No facility-administered encounter medications on file as of 01/07/2020.    Allergies (verified) Patient has no known allergies.   History: Past Medical History:  Diagnosis Date  . Anemia    not currently being treated for this  . Arthritis   . History of colon polyps   . Hypertension    Past Surgical History:  Procedure Laterality Date  . CHOLECYSTECTOMY  1989  . COLONOSCOPY WITH PROPOFOL N/A 04/10/2017   Procedure: COLONOSCOPY WITH PROPOFOL;  Surgeon: Lin Landsman, MD;  Location: [facility] ENDOSCOPY;  Service: Gastroenterology;  Laterality: N/A;  .  ESOPHAGOGASTRODUODENOSCOPY (EGD) WITH PROPOFOL N/A 04/10/2017   Procedure: ESOPHAGOGASTRODUODENOSCOPY (EGD) WITH PROPOFOL;  Surgeon: Lin Landsman, MD;  Location: [facility] ENDOSCOPY;  Service: Gastroenterology;  Laterality: N/A;  . GASTRIC BYPASS  03/21/03  . JOINT REPLACEMENT Right 04/2014   TOTAL KNEE REPLACEMENT, DR. Rudene Christians, [place]  . TOTAL KNEE ARTHROPLASTY Left 02/27/2016   Procedure: TOTAL KNEE ARTHROPLASTY;  Surgeon: Hessie Knows, MD;  Location: [facility] ORS;  Service: Orthopedics;  Laterality: Left;  . TUBAL LIGATION  1976   Family History  Problem Relation Age of Onset  . Hypertension Mother   . Diabetes Mother   . Cancer Maternal Aunt        question of type  . Cancer Maternal Grandmother        colon  . Breast cancer Cousin 44       maternal side   Social History   Socioeconomic History  . Marital status: Married    Spouse name: Not on file  . Number of children: 2  . Years of education: Not on file  . Highest education level: Not on file  Occupational History    Employer: [employer]  Tobacco Use  . Smoking status: Never Smoker  . Smokeless tobacco: Never Used  Vaping Use  . Vaping Use: Never used  Substance and Sexual Activity  . Alcohol use: No    Alcohol/week: 0.0 standard drinks  . Drug use: No  . Sexual activity: Yes  Other Topics Concern  . Not on file  Social History Narrative  . Not on file  Social Determinants of Health   Financial Resource Strain: Low Risk   . Difficulty of Paying Living Expenses: Not hard at all  Food Insecurity: No Food Insecurity  . Worried About Charity fundraiser in the Last Year: Never true  . Ran Out of Food in the Last Year: Never true  Transportation Needs: No Transportation Needs  . Lack of Transportation (Medical): No  . Lack of Transportation (Non-Medical): No  Physical Activity: Sufficiently Active  . Days of Exercise per Week: 5 days  . Minutes of Exercise per Session: 30 min  Stress: No Stress Concern Present  . Feeling  of Stress : Not at all  Social Connections: Unknown  . Frequency of Communication with Friends and Family: More than three times a week  . Frequency of Social Gatherings with Friends and Family: More than three times a week  . Attends Religious Services: Not on file  . Active Member of Clubs or Organizations: Not on file  . Attends Archivist Meetings: Not on file  . Marital Status: Not on file    Tobacco Counseling Counseling given: Not Answered   Clinical Intake:  Pre-visit preparation completed: Yes        Diabetes: No  How often do you need to have someone help you when you read instructions, pamphlets, or other written materials from your doctor or pharmacy?: 1 - Never    Interpreter Needed?: No      Activities of Daily Living In your present state of health, do you have any difficulty performing the following activities: 01/07/2020  Hearing? N  Vision? N  Difficulty concentrating or making decisions? N  Walking or climbing stairs? N  Dressing or bathing? N  Doing errands, shopping? N  Preparing Food and eating ? N  Using the Toilet? N  In the past six months, have you accidently leaked urine? N  Do you have problems with loss of bowel control? N  Managing your Medications? N  Managing your Finances? N  Housekeeping or managing your Housekeeping? N  Some recent data might be hidden    Patient Care Team: Einar Pheasant, MD as PCP - General (Internal Medicine)  Indicate any recent Medical Services you may have received from other than [facility] providers in the past year (date may be approximate).     Assessment:   This is a routine wellness examination for Kelly Wallace.  I connected with Kelly Wallace today by telephone and verified that I am speaking with the correct person using two identifiers. Location patient: home Location provider: work Persons participating in the virtual visit: patient, Marine scientist.    I discussed the limitations, risks, security and  privacy concerns of performing an evaluation and management service by telephone and the availability of in person appointments. The patient expressed understanding and verbally consented to this telephonic visit.    Interactive audio and video telecommunications were attempted between this provider and patient, however failed, due to patient having technical difficulties OR patient did not have access to video capability.  We continued and completed visit with audio only.  Some vital signs may be absent or patient reported.   Hearing/Vision screen  Hearing Screening   125Hz  250Hz  500Hz  1000Hz  2000Hz  3000Hz  4000Hz  6000Hz  8000Hz   Right ear:           Left ear:           Comments: Patient is able to hear conversational tones without difficulty.  No issues reported.    Vision Screening  Comments: Wears corrective lenses  Visual acuity not assessed, virtual visit. They have seen their ophthalmologist in the last 12 months.   Dietary issues and exercise activities discussed: Current Exercise Habits: Home exercise routine, Type of exercise: walking, Time (Minutes): 30, Frequency (Times/Week): 5, Weekly Exercise (Minutes/Week): 150, Intensity: Mild  Healthy diet Good water intake  Goals    . Follow up with Provider as scheduled      Depression Screen PHQ 2/9 Scores 01/07/2020 01/06/2019 02/26/2017 12/04/2015 08/31/2015 02/24/2013  PHQ - 2 Score 0 0 0 0 0 0    Fall Risk Fall Risk  01/07/2020 01/06/2019 12/04/2015 08/31/2015 02/24/2013  Falls in the past year? 0 0 No No No  Number falls in past yr: 0 - - - -  Injury with Fall? 0 - - - -  Follow up Falls evaluation completed Falls prevention discussed - - -    FALL RISK PREVENTION PERTAINING TO THE HOME: Handrails in use when climbing stairs? Yes Home free of loose throw rugs in walkways, pet beds, electrical cords, etc? Yes  Adequate lighting in your home to reduce risk of falls? Yes   ASSISTIVE DEVICES UTILIZED TO PREVENT FALLS: Use of a  cane, walker or w/c? No   TIMED UP AND GO: Was the test performed? No . Virtual visit.   Cognitive Function:  Patient is alert and oriented x3. Denies difficulty focusing, making decisions, memory loss. Currently working 2 jobs. MMSE/6CIT deferred. Normal by direct communication/observation.    6CIT Screen 01/06/2019  What Year? 0 points  What month? 0 points  What time? 0 points  Count back from 20 0 points  Months in reverse 2 points  Repeat phrase 2 points  Total Score 4    Immunizations Immunization History  Administered Date(s) Administered  . Fluad Quad(high Dose 65+) 10/22/2019  . Influenza Split 10/27/2012, 10/21/2013  . Moderna Sars-Covid-2 Vaccination 02/24/2019, 03/25/2019, 12/06/2019    TDAP status: Due, Education has been provided regarding the importance of this vaccine. Advised may receive this vaccine at local pharmacy or Health Dept. Aware to provide a copy of the vaccination record if obtained from local pharmacy or Health Dept. Verbalized acceptance and understanding. Deferred.   Pneumococcal vaccine- Advised may receive this vaccine in office visit, at local pharmacy or Health Dept. Aware to provide a copy of the vaccination record if obtained from local pharmacy or Health Dept. Verbalized acceptance and understanding. Deferred.   Health Maintenance Health Maintenance  Topic Date Due  . DEXA SCAN  01/06/2021 (Originally 05/28/2017)  . TETANUS/TDAP  01/06/2021 (Originally 05/29/1969)  . PNA vac Low Risk Adult (1 of 2 - PCV13) 01/06/2021 (Originally 05/28/2017)  . MAMMOGRAM  12/29/2020  . COLONOSCOPY  04/11/2022  . INFLUENZA VACCINE  Completed  . COVID-19 Vaccine  Completed  . Hepatitis C Screening  Completed   Colorectal cancer screening: Type of screening: Colonoscopy. Completed 04/10/17. Repeat every 3 years.  Mammogram status: Completed 12/30/19. Repeat every year. 3D Screen Bilateral.  Bone Density- deferred per patient preference.   Lung Cancer  Screening: (Low Dose CT Chest recommended if Age 15-80 years, 30 pack-year currently smoking OR have quit w/in 15years.) does not qualify.   Hepatitis C Screening: Completed 03/25/19.   Vision Screening: Recommended annual ophthalmology exams for early detection of glaucoma and other disorders of the eye. Is the patient up to date with their annual eye exam?  Yes   Dental Screening: Recommended annual dental exams for proper oral hygiene.  [employer]  Referral / Chronic Care Management: CRR required this visit?  No   CCM required this visit?  No      Plan:   Keep all routine maintenance appointments.   I have personally reviewed and noted the following in the patient's chart:   . Medical and social history . Use of alcohol, tobacco or illicit drugs  . Current medications and supplements . Functional ability and status . Nutritional status . Physical activity . Advanced directives . List of other physicians . Hospitalizations, surgeries, and ER visits in previous 12 months . Vitals . Screenings to include cognitive, depression, and falls . Referrals and appointments  In addition, I have reviewed and discussed with patient certain preventive protocols, quality metrics, and best practice recommendations. A written personalized care plan for preventive services as well as general preventive health recommendations were provided to patient via mychart.     Varney Biles, LPN   [id number]

## 2020-03-21 ENCOUNTER — Telehealth: Payer: Self-pay | Admitting: Internal Medicine

## 2020-03-21 NOTE — Telephone Encounter (Signed)
Overdue appt.  Does not have one scheduled.  Please schedule her for a physical.  Thanks

## 2020-03-21 NOTE — Telephone Encounter (Signed)
LMTCB and schedule

## 2020-04-09 ENCOUNTER — Emergency Department
Admission: EM | Admit: 2020-04-09 | Discharge: 2020-04-10 | Disposition: A | Discharge status: Home / Self Care | Payer: Medicare Other | Attending: Emergency Medicine | Admitting: Emergency Medicine

## 2020-04-09 ENCOUNTER — Other Ambulatory Visit: Payer: Self-pay

## 2020-04-09 DIAGNOSIS — M7918 Myalgia, other site: Secondary | ICD-10-CM

## 2020-04-09 DIAGNOSIS — Z79899 Other long term (current) drug therapy: Secondary | ICD-10-CM | POA: Diagnosis not present

## 2020-04-09 DIAGNOSIS — Z96651 Presence of right artificial knee joint: Secondary | ICD-10-CM | POA: Diagnosis not present

## 2020-04-09 DIAGNOSIS — R062 Wheezing: Secondary | ICD-10-CM | POA: Diagnosis not present

## 2020-04-09 DIAGNOSIS — R519 Headache, unspecified: Secondary | ICD-10-CM | POA: Insufficient documentation

## 2020-04-09 DIAGNOSIS — Y990 Civilian activity done for income or pay: Secondary | ICD-10-CM | POA: Diagnosis not present

## 2020-04-09 DIAGNOSIS — M25562 Pain in left knee: Secondary | ICD-10-CM | POA: Diagnosis not present

## 2020-04-09 DIAGNOSIS — M79602 Pain in left arm: Secondary | ICD-10-CM | POA: Diagnosis not present

## 2020-04-09 DIAGNOSIS — Z8616 Personal history of COVID-19: Secondary | ICD-10-CM | POA: Insufficient documentation

## 2020-04-09 DIAGNOSIS — I10 Essential (primary) hypertension: Secondary | ICD-10-CM | POA: Diagnosis not present

## 2020-04-09 DIAGNOSIS — M791 Myalgia, unspecified site: Secondary | ICD-10-CM | POA: Diagnosis not present
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
## 2020-04-10 ENCOUNTER — Emergency Department: Payer: Medicare Other

## 2020-04-10 ENCOUNTER — Encounter: Payer: Self-pay | Admitting: Radiology
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
## 2020-04-10 NOTE — ED Notes (Signed)
Pt calling husband to come and pick up post discharge.
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
## 2020-04-19 ENCOUNTER — Inpatient Hospital Stay: Payer: Medicare Other | Attending: Oncology

## 2020-08-01 ENCOUNTER — Encounter: Payer: Self-pay | Admitting: Family

## 2020-08-01 ENCOUNTER — Ambulatory Visit (INDEPENDENT_AMBULATORY_CARE_PROVIDER_SITE_OTHER): Payer: Medicare Other | Admitting: Family

## 2020-08-01 ENCOUNTER — Other Ambulatory Visit: Payer: Self-pay

## 2020-08-01 ENCOUNTER — Ambulatory Visit (INDEPENDENT_AMBULATORY_CARE_PROVIDER_SITE_OTHER): Payer: Medicare Other

## 2020-08-01 VITALS — BP 162/90 | HR 82 | Temp 96.2°F | Ht 65.0 in | Wt 239.0 lb

## 2020-08-01 DIAGNOSIS — R109 Unspecified abdominal pain: Secondary | ICD-10-CM | POA: Diagnosis not present

## 2020-08-01 DIAGNOSIS — K5900 Constipation, unspecified: Secondary | ICD-10-CM | POA: Diagnosis not present

## 2020-08-01 MED ORDER — POLYETHYLENE GLYCOL 3350 17 GM/SCOOP PO POWD
17.0000 g | Freq: Every day | ORAL | 1 refills | Status: DC
  Filled 2020-08-01: qty 3350, 197d supply, fill #0

## 2020-08-01 NOTE — Progress Notes (Signed)
Acute Office Visit  Subjective:    [name], female    DOB: 02-15-1950, 70 y.o.   MRN: [id number]  Chief Complaint  Patient presents with  . Constipation    Pt c/o constipation x3month. Pt states that she has to use the restroom constantly and that she is not getting a full bowel movement.    HPI Patient is in today with c/o feeling like she is having incomplete bowel movements x 1 month. She reports her stools being soft, no blood or dark black stools. She admits to being worried about colon cancer. Her grandmother had colon CA. She had a colonoscopy in 2019 that revealed a 39mm polyp and was instructed to return for a repeat colonoscopy in 5 years. Has not been taking any OTC medication   Past Medical History:  Diagnosis Date  . Anemia    not currently being treated for this  . Arthritis   . History of colon polyps   . Hypertension     Past Surgical History:  Procedure Laterality Date  . CHOLECYSTECTOMY  1989  . COLONOSCOPY WITH PROPOFOL N/A 04/10/2017   Procedure: COLONOSCOPY WITH PROPOFOL;  Surgeon: Lin Landsman, MD;  Location: [facility] ENDOSCOPY;  Service: Gastroenterology;  Laterality: N/A;  . ESOPHAGOGASTRODUODENOSCOPY (EGD) WITH PROPOFOL N/A 04/10/2017   Procedure: ESOPHAGOGASTRODUODENOSCOPY (EGD) WITH PROPOFOL;  Surgeon: Lin Landsman, MD;  Location: [facility] ENDOSCOPY;  Service: Gastroenterology;  Laterality: N/A;  . GASTRIC BYPASS  03/21/03  . JOINT REPLACEMENT Right 04/2014   TOTAL KNEE REPLACEMENT, DR. Rudene Christians, [place]  . TOTAL KNEE ARTHROPLASTY Left 02/27/2016   Procedure: TOTAL KNEE ARTHROPLASTY;  Surgeon: Hessie Knows, MD;  Location: [facility] ORS;  Service: Orthopedics;  Laterality: Left;  . TUBAL LIGATION  1976    Family History  Problem Relation Age of Onset  . Hypertension Mother   . Diabetes Mother   . Cancer Maternal Aunt        question of type  . Cancer Maternal Grandmother        colon  . Breast cancer Cousin 83       maternal side     Social History   Socioeconomic History  . Marital status: Married    Spouse name: Not on file  . Number of children: 2  . Years of education: Not on file  . Highest education level: Not on file  Occupational History    Employer: [employer]  Tobacco Use  . Smoking status: Never  . Smokeless tobacco: Never  Vaping Use  . Vaping Use: Never used  Substance and Sexual Activity  . Alcohol use: No    Alcohol/week: 0.0 standard drinks  . Drug use: No  . Sexual activity: Yes  Other Topics Concern  . Not on file  Social History Narrative  . Not on file   Social Determinants of Health   Financial Resource Strain: Low Risk   . Difficulty of Paying Living Expenses: Not hard at all  Food Insecurity: No Food Insecurity  . Worried About Charity fundraiser in the Last Year: Never true  . Ran Out of Food in the Last Year: Never true  Transportation Needs: No Transportation Needs  . Lack of Transportation (Medical): No  . Lack of Transportation (Non-Medical): No  Physical Activity: Sufficiently Active  . Days of Exercise per Week: 5 days  . Minutes of Exercise per Session: 30 min  Stress: No Stress Concern Present  . Feeling of Stress : Not at  all  Social Connections: Unknown  . Frequency of Communication with Friends and Family: More than three times a week  . Frequency of Social Gatherings with Friends and Family: More than three times a week  . Attends Religious Services: Not on file  . Active Member of Clubs or Organizations: Not on file  . Attends Archivist Meetings: Not on file  . Marital Status: Not on file  Intimate Partner Violence: Not At Risk  . Fear of Current or Ex-Partner: No  . Emotionally Abused: No  . Physically Abused: No  . Sexually Abused: No    Outpatient Medications Prior to Visit  Medication Sig Dispense Refill  . lisinopril (ZESTRIL) 10 MG tablet Take 1 tablet (10 mg total) by mouth daily. 90 tablet 0  . vitamin B-12 (CYANOCOBALAMIN) 1000 MCG  tablet Take 1,000 mcg by mouth daily.  (Patient not taking: Reported on 08/01/2020)    . Vitamin D, Ergocalciferol, (DRISDOL) 1.25 MG (50000 UNIT) CAPS capsule Take 1 capsule (50,000 Units total) by mouth every 7 (seven) days. (Patient not taking: Reported on 08/01/2020) 5 capsule 2   No facility-administered medications prior to visit.    No Known Allergies  Review of Systems  HENT: Negative.    Respiratory: Negative.    Gastrointestinal:  Negative for abdominal distention, abdominal pain, anal bleeding and blood in stool.       Incomplete emptying of her bowels.   Endocrine: Negative.   Genitourinary: Negative.  Negative for difficulty urinating.  Musculoskeletal: Negative.   Skin: Negative.   Allergic/Immunologic: Negative.   Neurological: Negative.   Psychiatric/Behavioral: Negative.    All other systems reviewed and are negative.     Objective:    Physical Exam Vitals and nursing note reviewed.  Constitutional:      Appearance: Normal appearance. She is obese.  Cardiovascular:     Rate and Rhythm: Normal rate and regular rhythm.     Pulses: Normal pulses.     Heart sounds: Normal heart sounds.  Pulmonary:     Effort: Pulmonary effort is normal.     Breath sounds: Normal breath sounds.  Abdominal:     General: Abdomen is flat. Bowel sounds are normal.     Palpations: Abdomen is soft.  Musculoskeletal:        General: Normal range of motion.     Cervical back: Normal range of motion and neck supple.  Skin:    General: Skin is warm and dry.  Neurological:     General: No focal deficit present.     Mental Status: She is alert and oriented to person, place, and time.  Psychiatric:        Mood and Affect: Mood normal.        Behavior: Behavior normal.   BP (!) 162/90 (BP Location: Left Arm, Patient Position: Sitting)   Pulse 82   Temp (!) 96.2 F (35.7 C)   Ht 5\' 5"  (1.651 m)   Wt 239 lb (108.4 kg)   LMP 04/06/2009   SpO2 97%   BMI 39.77 kg/m  Wt Readings  from Last 3 Encounters:  08/01/20 239 lb (108.4 kg)  04/09/20 220 lb (99.8 kg)  01/07/20 234 lb (106.1 kg)    Health Maintenance Due  Topic Date Due  . Zoster Vaccines- Shingrix (1 of 2) Never done  . COVID-19 Vaccine (4 - Booster for Moderna series) 04/04/2020    There are no preventive care reminders to display for this patient.  Lab Results  Component Value Date   TSH 1.26 03/25/2019   Lab Results  Component Value Date   WBC 5.9 10/19/2019   HGB 13.1 10/19/2019   HCT 38.0 10/19/2019   MCV 90.3 10/19/2019   PLT 171 10/19/2019   Lab Results  Component Value Date   NA 138 06/22/2019   K 3.9 06/22/2019   CO2 27 06/22/2019   GLUCOSE 93 06/22/2019   BUN 12 06/22/2019   CREATININE 0.65 06/22/2019   BILITOT 0.7 03/25/2019   ALKPHOS 160 (H) 03/25/2019   AST 16 03/25/2019   ALT 12 03/25/2019   PROT 6.7 03/25/2019   ALBUMIN 3.6 03/25/2019   CALCIUM 8.9 06/22/2019   ANIONGAP 11 03/24/2017   GFR 109.99 06/22/2019   Lab Results  Component Value Date   CHOL 98 03/25/2019   Lab Results  Component Value Date   HDL 44.30 03/25/2019   Lab Results  Component Value Date   LDLCALC 39 03/25/2019   Lab Results  Component Value Date   TRIG 72.0 03/25/2019   Lab Results  Component Value Date   CHOLHDL 2 03/25/2019   Lab Results  Component Value Date   HGBA1C 5.6 03/25/2019       Assessment & Plan:   Problem List Items Addressed This Visit   None Visit Diagnoses     Constipation, unspecified constipation type    -  Primary   Relevant Orders   DG Abd 1 View        Meds ordered this encounter  Medications  . polyethylene glycol powder (GLYCOLAX/MIRALAX) 17 GM/SCOOP powder    Sig: Take 17 g by mouth daily.    Dispense:  3350 g    Refill:  [place], FNP

## 2020-08-01 NOTE — Patient Instructions (Signed)

## 2020-08-04 ENCOUNTER — Encounter: Payer: Self-pay | Admitting: Family

## 2020-08-04 ENCOUNTER — Telehealth: Payer: Self-pay | Admitting: Internal Medicine

## 2020-08-04 NOTE — Telephone Encounter (Signed)
See result note message. Pt is aware that xray was normal.

## 2020-08-04 NOTE — Telephone Encounter (Signed)
PT called to return the missed call for imaging results

## 2020-08-14 ENCOUNTER — Other Ambulatory Visit: Payer: Self-pay

## 2020-08-14 ENCOUNTER — Other Ambulatory Visit: Payer: Self-pay | Admitting: Internal Medicine

## 2020-08-14 DIAGNOSIS — M25562 Pain in left knee: Secondary | ICD-10-CM

## 2020-08-14 DIAGNOSIS — G8929 Other chronic pain: Secondary | ICD-10-CM

## 2020-08-14 MED ORDER — LISINOPRIL 10 MG PO TABS
10.0000 mg | ORAL_TABLET | Freq: Every day | ORAL | 0 refills | Status: DC
  Filled 2020-08-14: qty 90, 90d supply, fill #0

## 2020-08-23 ENCOUNTER — Other Ambulatory Visit: Payer: Self-pay

## 2020-08-23 ENCOUNTER — Encounter: Payer: Self-pay | Admitting: Oncology

## 2020-08-29 ENCOUNTER — Encounter: Payer: Self-pay | Admitting: Oncology

## 2020-08-29 ENCOUNTER — Other Ambulatory Visit: Payer: Self-pay

## 2020-08-29 MED ORDER — IBUPROFEN 800 MG PO TABS
ORAL_TABLET | ORAL | 0 refills | Status: DC
  Filled 2020-08-29 (×2): qty 10, 4d supply, fill #0

## 2020-08-29 MED ORDER — AMOXICILLIN 500 MG PO CAPS
ORAL_CAPSULE | ORAL | 0 refills | Status: DC
  Filled 2020-08-29 (×3): qty 30, 10d supply, fill #0

## 2020-08-29 MED ORDER — AMOXICILLIN 500 MG PO TABS: ORAL_TABLET | ORAL | 0 refills | Status: DC

## 2020-08-29 MED ORDER — CHLORHEXIDINE GLUCONATE 0.12 % MT SOLN
OROMUCOSAL | 0 refills | Status: DC
  Filled 2020-08-29: qty 473, 15d supply, fill #0
  Filled 2020-08-29: qty 473, 48d supply, fill #0

## 2020-09-05 ENCOUNTER — Other Ambulatory Visit: Payer: Self-pay

## 2020-10-23 ENCOUNTER — Inpatient Hospital Stay (HOSPITAL_BASED_OUTPATIENT_CLINIC_OR_DEPARTMENT_OTHER): Payer: Medicare Other | Admitting: Oncology

## 2020-10-23 ENCOUNTER — Other Ambulatory Visit: Payer: Self-pay

## 2020-10-23 ENCOUNTER — Inpatient Hospital Stay: Payer: Medicare Other | Attending: Oncology

## 2020-10-23 VITALS — BP 162/102 | HR 72 | Temp 98.3°F | Resp 18 | Wt 233.6 lb

## 2020-10-23 DIAGNOSIS — D509 Iron deficiency anemia, unspecified: Secondary | ICD-10-CM

## 2020-10-23 DIAGNOSIS — E538 Deficiency of other specified B group vitamins: Secondary | ICD-10-CM | POA: Insufficient documentation

## 2020-10-23 LAB — CBC WITH DIFFERENTIAL/PLATELET
Abs Immature Granulocytes: 0.01 10*3/uL (ref 0.00–0.07)
Basophils Absolute: 0 10*3/uL (ref 0.0–0.1)
Basophils Relative: 0 %
Eosinophils Absolute: 0.2 10*3/uL (ref 0.0–0.5)
Eosinophils Relative: 3 %
HCT: 39.6 % (ref 36.0–46.0)
Hemoglobin: 13 g/dL (ref 12.0–15.0)
Immature Granulocytes: 0 %
Lymphocytes Relative: 36 %
Lymphs Abs: 2.2 10*3/uL (ref 0.7–4.0)
MCH: 31.5 pg (ref 26.0–34.0)
MCHC: 32.8 g/dL (ref 30.0–36.0)
MCV: 95.9 fL (ref 80.0–100.0)
Monocytes Absolute: 0.4 10*3/uL (ref 0.1–1.0)
Monocytes Relative: 7 %
Neutro Abs: 3.2 10*3/uL (ref 1.7–7.7)
Neutrophils Relative %: 54 %
Platelets: 214 10*3/uL (ref 150–400)
RBC: 4.13 MIL/uL (ref 3.87–5.11)
RDW: 13.7 % (ref 11.5–15.5)
WBC: 6 10*3/uL (ref 4.0–10.5)
nRBC: 0 % (ref 0.0–0.2)

## 2020-10-23 LAB — VITAMIN B12: Vitamin B-12: 153 pg/mL — ABNORMAL LOW (ref 180–914)

## 2020-10-23 NOTE — Progress Notes (Signed)
[name]  Telephone:([phone] Fax:([phone]  Patient Care Team: Einar Pheasant, MD as PCP - General (Internal Medicine)   Name of the patient: Kelly Wallace  [id number]  19-Nov-1950   Date of visit: 10/23/2020  ECOG: 0 - Asymptomatic  History of present illness:  Patient is a 70 year old obese African-American female  who has been referred to [place] for evaluation and management of anemia.  She has a history of gastric bypass surgery back in 2005.  She is not currently taking any iron supplements.  Reports possible history of colon cancer in her maternal grandmother but she is not sure.    She is postmenopausal    Recent CBC from 02/26/2017 showed white count of 5, H&H of 9.5/30.4 with an MCV of 78.7 and a platelet count of 232.  Ferritin levels were low at 4.9.  Of note ferritin was low at 7.45 months ago as well.  B12 levels were low at 204.  TSH was normal.  On review of her prior CBCs her hemoglobin has been between 9-10 for the last 1 year.  Borderline low MCV levels.  No other cytopenias.  Last colonoscopy in June 2013 showed internal hemorrhoids but no other abnormal findings. pateint had repeat EGD and colonoscopy inmarch 2019 which did not reveal any bleeding.s he received 2 doses of feraheme in march 2019  Interval history: Patient reports doing well.  Denies any blood loss.  Continues to work full-time at [employer].  Has occasional shortness of breath with exertion and left knee pain that is chronic and stable.  She takes B12 and iron supplements when she remembers.   Review of Systems  Respiratory:  Positive for shortness of breath.   Musculoskeletal:  Positive for joint pain.   No Known Allergies  Past Medical History:  Diagnosis Date   Anemia    not currently being treated for this   Arthritis    History of colon polyps    Hypertension     Past Surgical History:  Procedure Laterality Date   CHOLECYSTECTOMY  1989   COLONOSCOPY  WITH PROPOFOL N/A 04/10/2017   Procedure: COLONOSCOPY WITH PROPOFOL;  Surgeon: Lin Landsman, MD;  Location: [facility] ENDOSCOPY;  Service: Gastroenterology;  Laterality: N/A;   ESOPHAGOGASTRODUODENOSCOPY (EGD) WITH PROPOFOL N/A 04/10/2017   Procedure: ESOPHAGOGASTRODUODENOSCOPY (EGD) WITH PROPOFOL;  Surgeon: Lin Landsman, MD;  Location: [facility] ENDOSCOPY;  Service: Gastroenterology;  Laterality: N/A;   GASTRIC BYPASS  03/21/03   JOINT REPLACEMENT Right 04/2014   TOTAL KNEE REPLACEMENT, DR. Rudene Christians, [place]   TOTAL KNEE ARTHROPLASTY Left 02/27/2016   Procedure: TOTAL KNEE ARTHROPLASTY;  Surgeon: Hessie Knows, MD;  Location: [facility] ORS;  Service: Orthopedics;  Laterality: Left;   TUBAL LIGATION  1976    Social History   Socioeconomic History   Marital status: Married    Spouse name: Not on file   Number of children: 2   Years of education: Not on file   Highest education level: Not on file  Occupational History    Employer: [employer]  Tobacco Use   Smoking status: Never   Smokeless tobacco: Never  Vaping Use   Vaping Use: Never used  Substance and Sexual Activity   Alcohol use: No    Alcohol/week: 0.0 standard drinks   Drug use: No   Sexual activity: Yes  Other Topics Concern   Not on file  Social History Narrative   Not on file   Social Determinants of Health  Financial Resource Strain: Low Risk    Difficulty of Paying Living Expenses: Not hard at all  Food Insecurity: No Food Insecurity   Worried About Charity fundraiser in the Last Year: Never true   Ran Out of Food in the Last Year: Never true  Transportation Needs: No Transportation Needs   Lack of Transportation (Medical): No   Lack of Transportation (Non-Medical): No  Physical Activity: Sufficiently Active   Days of Exercise per Week: 5 days   Minutes of Exercise per Session: 30 min  Stress: No Stress Concern Present   Feeling of Stress : Not at all  Social Connections: Unknown   Frequency of Communication with Friends  and Family: More than three times a week   Frequency of Social Gatherings with Friends and Family: More than three times a week   Attends Religious Services: Not on Electrical engineer or Organizations: Not on file   Attends Archivist Meetings: Not on file   Marital Status: Not on file  Intimate Partner Violence: Not At Risk   Fear of Current or Ex-Partner: No   Emotionally Abused: No   Physically Abused: No   Sexually Abused: No    Family History  Problem Relation Age of Onset   Hypertension Mother    Diabetes Mother    Cancer Maternal Aunt        question of type   Cancer Maternal Grandmother        colon   Breast cancer Cousin 24       maternal side     Current Outpatient Medications:    chlorhexidine (PERIDEX) 0.12 % solution, RINSE MOUTH WITH 5 TO 10 ML ONCE DAILY AFTER BRUSHING FOR 1 MINUTE AND SPIT OUT., Disp: 473 mL, Rfl: 0   ibuprofen (ADVIL) 800 MG tablet, TAKE 1 TABLET BY MOUTH ONCE EVERY 8 HOURS WITH FOOD AS NEEDED., Disp: 10 tablet, Rfl: 0   lisinopril (ZESTRIL) 10 MG tablet, Take 1 tablet (10 mg total) by mouth daily., Disp: 90 tablet, Rfl: 0   polyethylene glycol powder (GLYCOLAX/MIRALAX) 17 GM/SCOOP powder, Take 17 g by mouth daily., Disp: 3350 g, Rfl: 1  No results found.  No images are attached to the encounter.   CMP Latest Ref Rng & Units 06/22/2019  Glucose 70 - 99 mg/dL 93  BUN 6 - 23 mg/dL 12  Creatinine 0.40 - 1.20 mg/dL 0.65  Sodium 135 - 145 mEq/L 138  Potassium 3.5 - 5.1 mEq/L 3.9  Chloride 96 - 112 mEq/L 104  CO2 19 - 32 mEq/L 27  Calcium 8.4 - 10.5 mg/dL 8.9  Total Protein 6.0 - 8.3 g/dL -  Total Bilirubin 0.2 - 1.2 mg/dL -  Alkaline Phos 39 - 117 U/L -  AST 0 - 37 U/L -  ALT 0 - 35 U/L -   CBC Latest Ref Rng & Units 10/23/2020  WBC 4.0 - 10.5 K/uL 6.0  Hemoglobin 12.0 - 15.0 g/dL 13.0  Hematocrit 36.0 - 46.0 % 39.6  Platelets 150 - 400 K/uL 214   Physical Exam Constitutional:      Appearance: Normal  appearance. She is obese.  HENT:     Head: Normocephalic and atraumatic.  Eyes:     Pupils: Pupils are equal, round, and reactive to light.  Cardiovascular:     Rate and Rhythm: Normal rate and regular rhythm.     Heart sounds: Normal heart sounds. No murmur heard. Pulmonary:  Effort: Pulmonary effort is normal.     Breath sounds: Normal breath sounds. No wheezing.  Abdominal:     General: Bowel sounds are normal. There is no distension.     Palpations: Abdomen is soft.     Tenderness: There is no abdominal tenderness.  Musculoskeletal:        General: Normal range of motion.     Cervical back: Normal range of motion.     Comments: Uses a cane for ambulation due to left knee pain  Skin:    General: Skin is warm and dry.     Findings: No rash.  Neurological:     Mental Status: She is alert and oriented to person, place, and time.  Psychiatric:        Judgment: Judgment normal.   Assessment and plan: Patient is a 70 year old female with history of iron deficiency anemia and this is a routine follow-up visit  Clinically she is doing well.  Lab work from today shows a hemoglobin of 13.0.  Ferritin and iron studies are pending during dictation.  She continues B12 and iron supplements when she thinks about it.  It is not every day but at least a few times per week.  Recommend she continue oral supplements as she is able to tolerate.  She does not require any additional iron at this time.  Recommend we continue to monitor her annually.  She knows she can return to clinic if she would like to be seen sooner.  RTC in 1 year for follow-up with labs (CBC, ferritin, iron panel and B12) and see Dr. Janese Banks.  I spent 20 minutes dedicated to the care of this patient (face-to-face and non-face-to-face) on the date of the encounter to include what is described in the assessment and plan.  Visit Diagnosis: 1. Iron deficiency anemia, unspecified iron deficiency anemia type    Faythe Casa,  NP 10/23/2020 2:10 PM

## 2020-10-23 NOTE — Progress Notes (Signed)
Pt has no complaints/concerns at this time. Pt bp 162/102 but pt states she has not taken bp meds this morning

## 2020-10-27 ENCOUNTER — Inpatient Hospital Stay: Payer: Medicare Other

## 2020-10-27 ENCOUNTER — Other Ambulatory Visit: Payer: Self-pay | Admitting: *Deleted

## 2020-10-27 DIAGNOSIS — E611 Iron deficiency: Secondary | ICD-10-CM

## 2020-10-27 DIAGNOSIS — D509 Iron deficiency anemia, unspecified: Secondary | ICD-10-CM | POA: Diagnosis not present

## 2020-10-27 DIAGNOSIS — E538 Deficiency of other specified B group vitamins: Secondary | ICD-10-CM

## 2020-10-27 MED ORDER — SODIUM CHLORIDE 0.9 % IV SOLN
Freq: Once | INTRAVENOUS | Status: DC
  Filled 2020-10-27: qty 250

## 2020-10-27 MED ORDER — SODIUM CHLORIDE 0.9% FLUSH
3.0000 mL | Freq: Once | INTRAVENOUS | Status: DC | PRN
  Filled 2020-10-27: qty 3

## 2020-10-27 MED ORDER — HEPARIN SOD (PORK) LOCK FLUSH 100 UNIT/ML IV SOLN
500.0000 [IU] | Freq: Once | INTRAVENOUS | Status: DC | PRN
  Filled 2020-10-27: qty 5

## 2020-10-27 MED ORDER — HEPARIN SOD (PORK) LOCK FLUSH 100 UNIT/ML IV SOLN
250.0000 [IU] | Freq: Once | INTRAVENOUS | Status: DC | PRN
  Filled 2020-10-27: qty 5

## 2020-10-27 MED ORDER — CYANOCOBALAMIN 1000 MCG/ML IJ SOLN
1000.0000 ug | Freq: Once | INTRAMUSCULAR | Status: AC
  Administered 2020-10-27: 1000 ug via INTRAMUSCULAR
  Filled 2020-10-27: qty 1

## 2020-10-27 MED ORDER — SODIUM CHLORIDE 0.9% FLUSH
10.0000 mL | Freq: Once | INTRAVENOUS | Status: DC | PRN
  Filled 2020-10-27: qty 10

## 2020-10-27 MED ORDER — ALTEPLASE 2 MG IJ SOLR
2.0000 mg | Freq: Once | INTRAMUSCULAR | Status: DC | PRN
  Filled 2020-10-27: qty 2

## 2020-11-27 ENCOUNTER — Other Ambulatory Visit: Payer: Self-pay

## 2020-11-27 ENCOUNTER — Inpatient Hospital Stay: Payer: Medicare Other | Attending: Oncology

## 2020-11-27 DIAGNOSIS — E538 Deficiency of other specified B group vitamins: Secondary | ICD-10-CM | POA: Insufficient documentation

## 2020-11-27 DIAGNOSIS — E611 Iron deficiency: Secondary | ICD-10-CM

## 2020-11-27 MED ORDER — CYANOCOBALAMIN 1000 MCG/ML IJ SOLN
1000.0000 ug | Freq: Once | INTRAMUSCULAR | Status: AC
  Administered 2020-11-27: 1000 ug via INTRAMUSCULAR
  Filled 2020-11-27: qty 1

## 2020-12-27 ENCOUNTER — Inpatient Hospital Stay: Payer: Medicare Other | Attending: Oncology

## 2020-12-27 ENCOUNTER — Other Ambulatory Visit: Payer: Self-pay

## 2020-12-27 DIAGNOSIS — E611 Iron deficiency: Secondary | ICD-10-CM

## 2020-12-27 DIAGNOSIS — E538 Deficiency of other specified B group vitamins: Secondary | ICD-10-CM | POA: Diagnosis not present

## 2020-12-27 MED ORDER — CYANOCOBALAMIN 1000 MCG/ML IJ SOLN
1000.0000 ug | Freq: Once | INTRAMUSCULAR | Status: AC
  Administered 2020-12-27: 1000 ug via INTRAMUSCULAR
  Filled 2020-12-27: qty 1

## 2021-01-09 ENCOUNTER — Ambulatory Visit (INDEPENDENT_AMBULATORY_CARE_PROVIDER_SITE_OTHER): Payer: Medicare Other

## 2021-01-09 VITALS — Ht 65.0 in | Wt 233.0 lb

## 2021-01-09 DIAGNOSIS — Z1231 Encounter for screening mammogram for malignant neoplasm of breast: Secondary | ICD-10-CM | POA: Diagnosis not present

## 2021-01-09 DIAGNOSIS — Z0000 Encounter for general adult medical examination without abnormal findings: Secondary | ICD-10-CM

## 2021-01-09 NOTE — Patient Instructions (Addendum)
Kelly Wallace , Thank you for taking time to come for your Medicare Wellness Visit. I appreciate your ongoing commitment to your health goals. Please review the following plan we discussed and let me know if I can assist you in the future.   These are the goals we discussed:  Goals       Patient Stated     Follow up with Provider as scheduled (pt-stated)        This is a list of the screening recommended for you and due dates:  Health Maintenance  Topic Date Due   Mammogram  12/29/2020   COVID-19 Vaccine (4 - Booster for Moderna series) 01/25/2021*   Zoster (Shingles) Vaccine (1 of 2) 04/09/2021*   Pneumonia Vaccine (1 - PCV) 01/09/2022*   DEXA scan (bone density measurement)  01/09/2022*   Tetanus Vaccine  01/09/2022*   Colon Cancer Screening  04/11/2022   Flu Shot  Completed   Hepatitis C Screening: USPSTF Recommendation to screen - Ages 18-79 yo.  Completed   HPV Vaccine  Aged Out  *Topic was postponed. The date shown is not the original due date.    Advanced directives: not yet completed  Mammogram- call [phone] to schedule  Conditions/risks identified: none new  Follow up in one year for your annual wellness visit    Preventive Care 65 Years and Older, Female Preventive care refers to lifestyle choices and visits with your health care provider that can promote health and wellness. What does preventive care include? A yearly physical exam. This is also called an annual well check. Dental exams once or twice a year. Routine eye exams. Ask your health care provider how often you should have your eyes checked. Personal lifestyle choices, including: Daily care of your teeth and gums. Regular physical activity. Eating a healthy diet. Avoiding tobacco and drug use. Limiting alcohol use. Practicing safe sex. Taking low-dose aspirin every day. Taking vitamin and mineral supplements as recommended by your health care provider. What happens during an annual well  check? The services and screenings done by your health care provider during your annual well check will depend on your age, overall health, lifestyle risk factors, and family history of disease. Counseling  Your health care provider may ask you questions about your: Alcohol use. Tobacco use. Drug use. Emotional well-being. Home and relationship well-being. Sexual activity. Eating habits. History of falls. Memory and ability to understand (cognition). Work and work Statistician. Reproductive health. Screening  You may have the following tests or measurements: Height, weight, and BMI. Blood pressure. Lipid and cholesterol levels. These may be checked every 5 years, or more frequently if you are over 33 years old. Skin check. Lung cancer screening. You may have this screening every year starting at age 42 if you have a 30-pack-year history of smoking and currently smoke or have quit within the past 15 years. Fecal occult blood test (FOBT) of the stool. You may have this test every year starting at age 37. Flexible sigmoidoscopy or colonoscopy. You may have a sigmoidoscopy every 5 years or a colonoscopy every 10 years starting at age 79. Hepatitis C blood test. Hepatitis B blood test. Sexually transmitted disease (STD) testing. Diabetes screening. This is done by checking your blood sugar (glucose) after you have not eaten for a while (fasting). You may have this done every 1-3 years. Bone density scan. This is done to screen for osteoporosis. You may have this done starting at age 71. Mammogram. This may be done every  1-2 years. Talk to your health care provider about how often you should have regular mammograms. Talk with your health care provider about your test results, treatment options, and if necessary, the need for more tests. Vaccines  Your health care provider may recommend certain vaccines, such as: Influenza vaccine. This is recommended every year. Tetanus, diphtheria, and  acellular pertussis (Tdap, Td) vaccine. You may need a Td booster every 10 years. Zoster vaccine. You may need this after age 60. Pneumococcal 13-valent conjugate (PCV13) vaccine. One dose is recommended after age 66. Pneumococcal polysaccharide (PPSV23) vaccine. One dose is recommended after age 35. Talk to your health care provider about which screenings and vaccines you need and how often you need them. This information is not intended to replace advice given to you by your health care provider. Make sure you discuss any questions you have with your health care provider. Document Released: 02/03/2015 Document Revised: 09/27/2015 Document Reviewed: 11/08/2014 Elsevier Interactive Patient Education  2017 [employer] Prevention in the Home Falls can cause injuries. They can happen to people of all ages. There are many things you can do to make your home safe and to help prevent falls. What can I do on the outside of my home? Regularly fix the edges of walkways and driveways and fix any cracks. Remove anything that might make you trip as you walk through a door, such as a raised step or threshold. Trim any bushes or trees on the path to your home. Use bright outdoor lighting. Clear any walking paths of anything that might make someone trip, such as rocks or tools. Regularly check to see if handrails are loose or broken. Make sure that both sides of any steps have handrails. Any raised decks and porches should have guardrails on the edges. Have any leaves, snow, or ice cleared regularly. Use sand or salt on walking paths during winter. Clean up any spills in your garage right away. This includes oil or grease spills. What can I do in the bathroom? Use night lights. Install grab bars by the toilet and in the tub and shower. Do not use towel bars as grab bars. Use non-skid mats or decals in the tub or shower. If you need to sit down in the shower, use a plastic, non-slip stool. Keep  the floor dry. Clean up any water that spills on the floor as soon as it happens. Remove soap buildup in the tub or shower regularly. Attach bath mats securely with double-sided non-slip rug tape. Do not have throw rugs and other things on the floor that can make you trip. What can I do in the bedroom? Use night lights. Make sure that you have a light by your bed that is easy to reach. Do not use any sheets or blankets that are too big for your bed. They should not hang down onto the floor. Have a firm chair that has side arms. You can use this for support while you get dressed. Do not have throw rugs and other things on the floor that can make you trip. What can I do in the kitchen? Clean up any spills right away. Avoid walking on wet floors. Keep items that you use a lot in easy-to-reach places. If you need to reach something above you, use a strong step stool that has a grab bar. Keep electrical cords out of the way. Do not use floor polish or wax that makes floors slippery. If you must use wax, use non-skid  floor wax. Do not have throw rugs and other things on the floor that can make you trip. What can I do with my stairs? Do not leave any items on the stairs. Make sure that there are handrails on both sides of the stairs and use them. Fix handrails that are broken or loose. Make sure that handrails are as long as the stairways. Check any carpeting to make sure that it is firmly attached to the stairs. Fix any carpet that is loose or worn. Avoid having throw rugs at the top or bottom of the stairs. If you do have throw rugs, attach them to the floor with carpet tape. Make sure that you have a light switch at the top of the stairs and the bottom of the stairs. If you do not have them, ask someone to add them for you. What else can I do to help prevent falls? Wear shoes that: Do not have high heels. Have rubber bottoms. Are comfortable and fit you well. Are closed at the toe. Do not  wear sandals. If you use a stepladder: Make sure that it is fully opened. Do not climb a closed stepladder. Make sure that both sides of the stepladder are locked into place. Ask someone to hold it for you, if possible. Clearly mark and make sure that you can see: Any grab bars or handrails. First and last steps. Where the edge of each step is. Use tools that help you move around (mobility aids) if they are needed. These include: Canes. Walkers. Scooters. Crutches. Turn on the lights when you go into a dark area. Replace any light bulbs as soon as they burn out. Set up your furniture so you have a clear path. Avoid moving your furniture around. If any of your floors are uneven, fix them. If there are any pets around you, be aware of where they are. Review your medicines with your doctor. Some medicines can make you feel dizzy. This can increase your chance of falling. Ask your doctor what other things that you can do to help prevent falls. This information is not intended to replace advice given to you by your health care provider. Make sure you discuss any questions you have with your health care provider. Document Released: 11/03/2008 Document Revised: 06/15/2015 Document Reviewed: 02/11/2014 Elsevier Interactive Patient Education  2017 [place] A mammogram is an X-ray of the breasts. This procedure can screen for and detect any changes that may indicate breast cancer. Mammograms are regularly done beginning at age 29 for women with average risk. A man may have a mammogram if he has a lump or swelling in his breast tissue. A mammogram can also identify other changes and variations in the breast, such as: Inflammation of the breast tissue (mastitis). An infected area that contains a collection of pus (abscess). A fluid-filled sac (cyst). Tumors that are not cancerous (benign). Fibrocystic changes. This is when breast tissue becomes denser and can make the tissue feel  rope-like or uneven under the skin. Women at higher risk for breast cancer need earlier and more comprehensive screening for abnormal changes. Breast tomosynthesis, or three-dimensional (3D) mammography, and digital breast tomosynthesis are advanced forms of imaging that create 3D pictures of the breasts. Tell a health care provider: About any allergies you have. If you have breast implants. If you have had previous breast disease, biopsy, or surgery. If you have a family history of breast cancer. If you are breastfeeding. Whether you are pregnant or  may be pregnant. What are the risks? Generally, this is a safe procedure. However, problems may occur, including: Exposure to radiation. Radiation levels are very low with this test. The need for more tests. The mammogram fails to detect certain cancers or the results are misinterpreted. Difficulty with detecting breast cancer in women with dense breasts. What happens before the procedure? Schedule your test about 1-2 weeks after your menstrual period if you are menstruating. This is usually when your breasts are the least tender. If you have had a mammogram done at a different facility in the past, get the mammogram X-rays or have them sent to your current exam facility. The new and old images will be compared. Wash your breasts and underarms on the day of the test. Do not wear deodorants, perfumes, lotions, or powders anywhere on your body on the day of the test. Remove any jewelry from your neck. Wear clothes that you can change into and out of easily. What happens during the procedure?  You will undress from the waist up and put on a gown that opens in the front. You will stand in front of the X-ray machine. Each breast will be placed between two plastic or glass plates. The plates will compress your breast for a few seconds. Try to stay as relaxed as possible. This procedure does not cause any harm to your breasts. Any discomfort you feel  will be very brief. X-rays will be taken from different angles of each breast. The procedure may vary among health care providers and hospitals. What can I expect after the procedure? The mammogram will be examined by a specialist (radiologist). You may need to repeat certain parts of the test, depending on the quality of the images. This is done if the radiologist needs a better view of the breast tissue. You may resume your normal activities. It is up to you to get the results of your procedure. Ask your health care provider, or the department that is doing the procedure, when your results will be ready. Summary A mammogram is an X-ray of the breasts. This procedure can screen for and detect any changes that may indicate breast cancer. A man may have a mammogram if he has a lump or swelling in his breast tissue. If you have had a mammogram done at a different facility in the past, get the mammogram X-rays or have them sent to your current exam facility in order to compare them. Schedule your test about 1-2 weeks after your menstrual period if you are menstruating. Ask when your test results will be ready. Make sure you get your test results. This information is not intended to replace advice given to you by your health care provider. Make sure you discuss any questions you have with your health care provider. Document Revised: 09/20/2020 Document Reviewed: 11/08/2019 Elsevier Patient Education  2022 [employer].

## 2021-01-09 NOTE — Progress Notes (Signed)
Subjective:   Kelly Wallace is a 70 y.o. female who presents for Medicare Annual (Subsequent) preventive examination.  Review of Systems    No ROS.  Medicare Wellness Virtual Visit.  Visual/audio telehealth visit, UTA vital signs.   See social history for additional risk factors.   Cardiac Risk Factors include: advanced age (>61men, >27 women)     Objective:    Today's Vitals   01/09/21 1033  Weight: 233 lb (105.7 kg)  Height: 5\' 5"  (1.651 m)   Body mass index is 38.77 kg/m.  Advanced Directives 01/09/2021 10/23/2020 01/07/2020 10/21/2019 02/17/2019 01/06/2019 08/18/2018  Does Patient Have a Medical Advance Directive? No No No No No No No  Would patient like information on creating a medical advance directive? No - Patient declined No - Patient declined No - Patient declined No - Patient declined No - Patient declined Yes (MAU/Ambulatory/Procedural Areas - Information given) No - Patient declined  Some encounter information is confidential and restricted. Go to Review Flowsheets activity to see all data.    Current Medications (verified) Outpatient Encounter Medications as of 01/09/2021  Medication Sig   lisinopril (ZESTRIL) 10 MG tablet Take 1 tablet (10 mg total) by mouth daily.   polyethylene glycol powder (GLYCOLAX/MIRALAX) 17 GM/SCOOP powder Take 17 g by mouth daily.   [DISCONTINUED] chlorhexidine (PERIDEX) 0.12 % solution RINSE MOUTH WITH 5 TO 10 ML ONCE DAILY AFTER BRUSHING FOR 1 MINUTE AND SPIT OUT.   [DISCONTINUED] ibuprofen (ADVIL) 800 MG tablet TAKE 1 TABLET BY MOUTH ONCE EVERY 8 HOURS WITH FOOD AS NEEDED.   No facility-administered encounter medications on file as of 01/09/2021.    Allergies (verified) Patient has no known allergies.   History: Past Medical History:  Diagnosis Date   Anemia    not currently being treated for this   Arthritis    History of colon polyps    Hypertension    Past Surgical History:  Procedure Laterality Date    CHOLECYSTECTOMY  1989   COLONOSCOPY WITH PROPOFOL N/A 04/10/2017   Procedure: COLONOSCOPY WITH PROPOFOL;  Surgeon: Lin Landsman, MD;  Location: [facility] ENDOSCOPY;  Service: Gastroenterology;  Laterality: N/A;   ESOPHAGOGASTRODUODENOSCOPY (EGD) WITH PROPOFOL N/A 04/10/2017   Procedure: ESOPHAGOGASTRODUODENOSCOPY (EGD) WITH PROPOFOL;  Surgeon: Lin Landsman, MD;  Location: [facility] ENDOSCOPY;  Service: Gastroenterology;  Laterality: N/A;   GASTRIC BYPASS  03/21/03   JOINT REPLACEMENT Right 04/2014   TOTAL KNEE REPLACEMENT, DR. Rudene Christians, [place]   TOTAL KNEE ARTHROPLASTY Left 02/27/2016   Procedure: TOTAL KNEE ARTHROPLASTY;  Surgeon: Hessie Knows, MD;  Location: [facility] ORS;  Service: Orthopedics;  Laterality: Left;   TUBAL LIGATION  1976   Family History  Problem Relation Age of Onset   Hypertension Mother    Diabetes Mother    Cancer Maternal Aunt        question of type   Cancer Maternal Grandmother        colon   Breast cancer Cousin 56       maternal side   Social History   Socioeconomic History   Marital status: Married    Spouse name: Not on file   Number of children: 2   Years of education: Not on file   Highest education level: Not on file  Occupational History    Employer: [employer]  Tobacco Use   Smoking status: Never   Smokeless tobacco: Never  Vaping Use   Vaping Use: Never used  Substance and Sexual Activity   Alcohol use:  No    Alcohol/week: 0.0 standard drinks   Drug use: No   Sexual activity: Yes  Other Topics Concern   Not on file  Social History Narrative   Not on file   Social Determinants of Health   Financial Resource Strain: Low Risk    Difficulty of Paying Living Expenses: Not hard at all  Food Insecurity: No Food Insecurity   Worried About Charity fundraiser in the Last Year: Never true   [place] in the Last Year: Never true  Transportation Needs: No Transportation Needs   Lack of Transportation (Medical): No   Lack of Transportation  (Non-Medical): No  Physical Activity: Sufficiently Active   Days of Exercise per Week: 5 days   Minutes of Exercise per Session: 30 min  Stress: No Stress Concern Present   Feeling of Stress : Not at all  Social Connections: Unknown   Frequency of Communication with Friends and Family: More than three times a week   Frequency of Social Gatherings with Friends and Family: More than three times a week   Attends Religious Services: Not on Electrical engineer or Organizations: Not on file   Attends Archivist Meetings: Not on file   Marital Status: Not on file    Tobacco Counseling Counseling given: Not Answered   Clinical Intake:  Pre-visit preparation completed: Yes        Diabetes: No  How often do you need to have someone help you when you read instructions, pamphlets, or other written materials from your doctor or pharmacy?: 1 - Never   Interpreter Needed?: No      Activities of Daily Living In your present state of health, do you have any difficulty performing the following activities: 01/09/2021  Hearing? Y  Comment Hearing aids  Vision? N  Difficulty concentrating or making decisions? N  Walking or climbing stairs? N  Comment PRN  Dressing or bathing? N  Doing errands, shopping? N  Preparing Food and eating ? N  Using the Toilet? N  In the past six months, have you accidently leaked urine? N  Do you have problems with loss of bowel control? N  Managing your Medications? N  Managing your Finances? N  Housekeeping or managing your Housekeeping? N  Some recent data might be hidden    Patient Care Team: Einar Pheasant, MD as PCP - General (Internal Medicine)  Indicate any recent Medical Services you may have received from other than [facility] providers in the past year (date may be approximate).     Assessment:   This is a routine wellness examination for [name].  Virtual Visit via Telephone Note  I connected with  APRILLE SAWHNEY on  01/09/21 at 10:30 AM EST by telephone and verified that I am speaking with the correct person using two identifiers.  Persons participating in the virtual visit: patient/Nurse Health Advisor   I discussed the limitations, risks, security and privacy concerns of performing an evaluation and management service by telephone and the availability of in person appointments. The patient expressed understanding and agreed to proceed.  Interactive audio and video telecommunications were attempted between this nurse and patient, however failed, due to patient having technical difficulties OR patient did not have access to video capability.  We continued and completed visit with audio only.  Some vital signs may be absent or patient reported.   Hearing/Vision screen Hearing Screening - Comments:: Hearing aids Vision Screening - Comments::  Wears corrective lenses  They have seen their ophthalmologist in the last 12 months.   Dietary issues and exercise activities discussed: Current Exercise Habits: Home exercise routine, Type of exercise: walking, Time (Minutes): 30, Frequency (Times/Week): 5, Weekly Exercise (Minutes/Week): 150, Intensity: Mild Healthy diet Good water intake   Goals Addressed               This Visit's Progress     Patient Stated     Follow up with Provider as scheduled (pt-stated)         Depression Screen PHQ 2/9 Scores 01/09/2021 08/01/2020 01/07/2020 01/06/2019 02/26/2017 12/04/2015 08/31/2015  PHQ - 2 Score 0 0 0 0 0 0 0    Fall Risk Fall Risk  01/09/2021 08/01/2020 01/07/2020 01/06/2019 12/04/2015  Falls in the past year? 0 0 0 0 No  Number falls in past yr: 0 0 0 - -  Injury with Fall? - 0 0 - -  Follow up Falls evaluation completed Falls evaluation completed Falls evaluation completed Falls prevention discussed -    FALL RISK PREVENTION PERTAINING TO THE HOME: Home free of loose throw rugs in walkways, pet beds, electrical cords, etc? Yes  Adequate lighting in  your home to reduce risk of falls? Yes   ASSISTIVE DEVICES UTILIZED TO PREVENT FALLS: Life alert? No  Use of a cane, walker or w/c? Yes  Grab bars in the bathroom? No  Shower chair or bench in shower? No  Elevated toilet seat or a handicapped toilet? No   TIMED UP AND GO: Was the test performed? No .   Cognitive Function:  Patient is alert and oriented x3.   6CIT Screen 01/06/2019  What Year? 0 points  What month? 0 points  What time? 0 points  Count back from 20 0 points  Months in reverse 2 points  Repeat phrase 2 points  Total Score 4    Immunizations Immunization History  Administered Date(s) Administered   Fluad Quad(high Dose 65+) 10/22/2019   Influenza Split 10/27/2012, 10/21/2013   Influenza, High Dose Seasonal PF 09/09/2020   Moderna Sars-Covid-2 Vaccination 02/24/2019, 03/25/2019, 12/06/2019   TDAP status: Due, Education has been provided regarding the importance of this vaccine. Advised may receive this vaccine at local pharmacy or Health Dept. Aware to provide a copy of the vaccination record if obtained from local pharmacy or Health Dept. Verbalized acceptance and understanding. Deferred.   Pneumococcal vaccine status: Due, Education has been provided regarding the importance of this vaccine. Advised may receive this vaccine at local pharmacy or Health Dept. Aware to provide a copy of the vaccination record if obtained from local pharmacy or Health Dept. Verbalized acceptance and understanding. Deferred.   Shingrix Completed?: No.    Education has been provided regarding the importance of this vaccine. Patient has been advised to call insurance company to determine out of pocket expense if they have not yet received this vaccine. Advised may also receive vaccine at local pharmacy or Health Dept. Verbalized acceptance and understanding.  Screening Tests Health Maintenance  Topic Date Due   MAMMOGRAM  12/29/2020   COVID-19 Vaccine (4 - Booster for Moderna series)  01/25/2021 (Originally 01/31/2020)   Zoster Vaccines- Shingrix (1 of 2) 04/09/2021 (Originally 05/29/2002)   Pneumonia Vaccine 13+ Years old (1 - PCV) 01/09/2022 (Originally 05/28/2017)   DEXA SCAN  01/09/2022 (Originally 05/28/2017)   TETANUS/TDAP  01/09/2022 (Originally 05/29/1971)   COLONOSCOPY (Pts 45-85yrs Insurance coverage will need to be confirmed)  04/11/2022   INFLUENZA  VACCINE  Completed   Hepatitis C Screening  Completed   HPV VACCINES  Aged Out   Health Maintenance Health Maintenance Due  Topic Date Due   MAMMOGRAM  12/29/2020   Mammogram- ordered. Number provided for scheduling [phone], [facility].  Bone density- deferred per patient.   Lung Cancer Screening: (Low Dose CT Chest recommended if Age 10-80 years, 30 pack-year currently smoking OR have quit w/in 15years.) does not qualify.   Vision Screening: Recommended annual ophthalmology exams for early detection of glaucoma and other disorders of the eye.  Dental Screening: Recommended annual dental exams for proper oral hygiene.  Community Resource Referral / Chronic Care Management: CRR required this visit?  No   CCM required this visit?  No      Plan:   Keep all routine maintenance appointments.   I have personally reviewed and noted the following in the patients chart:   Medical and social history Use of alcohol, tobacco or illicit drugs  Current medications and supplements including opioid prescriptions. Not taking opioid.  Functional ability and status Nutritional status Physical activity Advanced directives List of other physicians Hospitalizations, surgeries, and ER visits in previous 12 months Vitals Screenings to include cognitive, depression, and falls Referrals and appointments  In addition, I have reviewed and discussed with patient certain preventive protocols, quality metrics, and best practice recommendations. A written personalized care plan for preventive services as well  as general preventive health recommendations were provided to patient.     Varney Biles, LPN   [id number]

## 2021-01-26 ENCOUNTER — Inpatient Hospital Stay: Payer: Medicare Other | Attending: Oncology

## 2021-02-27 ENCOUNTER — Inpatient Hospital Stay: Payer: Medicare Other | Attending: Oncology

## 2021-03-05 ENCOUNTER — Encounter: Payer: Self-pay | Admitting: Internal Medicine

## 2021-03-05 ENCOUNTER — Telehealth (INDEPENDENT_AMBULATORY_CARE_PROVIDER_SITE_OTHER): Payer: Medicare Other | Admitting: Internal Medicine

## 2021-03-05 ENCOUNTER — Other Ambulatory Visit: Payer: Self-pay

## 2021-03-05 ENCOUNTER — Ambulatory Visit
Admission: RE | Admit: 2021-03-05 | Discharge: 2021-03-05 | Disposition: A | Discharge status: Home / Self Care | Payer: Medicare Other | Source: Ambulatory Visit | Attending: Internal Medicine | Admitting: Internal Medicine

## 2021-03-05 DIAGNOSIS — D696 Thrombocytopenia, unspecified: Secondary | ICD-10-CM

## 2021-03-05 DIAGNOSIS — E559 Vitamin D deficiency, unspecified: Secondary | ICD-10-CM

## 2021-03-05 DIAGNOSIS — Z860100 Personal history of colon polyps, unspecified: Secondary | ICD-10-CM

## 2021-03-05 DIAGNOSIS — E611 Iron deficiency: Secondary | ICD-10-CM | POA: Diagnosis not present

## 2021-03-05 DIAGNOSIS — Z1231 Encounter for screening mammogram for malignant neoplasm of breast: Secondary | ICD-10-CM | POA: Diagnosis not present

## 2021-03-05 DIAGNOSIS — E538 Deficiency of other specified B group vitamins: Secondary | ICD-10-CM

## 2021-03-05 DIAGNOSIS — I10 Essential (primary) hypertension: Secondary | ICD-10-CM | POA: Diagnosis not present

## 2021-03-05 DIAGNOSIS — R748 Abnormal levels of other serum enzymes: Secondary | ICD-10-CM | POA: Diagnosis not present

## 2021-03-05 NOTE — Progress Notes (Signed)
[name], female   DOB: 05-31-1950, 71 y.o.   MRN: [id number]   Virtual Visit via video Note  This visit type was conducted due to national recommendations for restrictions regarding the COVID-19 pandemic (e.g. social distancing).  This format is felt to be most appropriate for this patient at this time.  All issues noted in this document were discussed and addressed.  No physical exam was performed (except for noted visual exam findings with Video Visits).   I connected with Kelly Wallace by a video enabled telemedicine application and verified that I am speaking with the correct person using two identifiers. Location patient: home Location provider: work Persons participating in the virtual visit: patient, provider  The limitations, risks, security and privacy concerns of performing an evaluation and management service by video and the availability of in person appointments have been discussed. It has also been discussed with the patient that there may be a patient responsible charge related to this service. The patient expressed understanding and agreed to proceed.   Reason for visit: follow up appt  HPI: Follow up regarding her blood pressure.  Also, has a history of gastric bypass in 2005.  Seeing hematology for f/u anemia.  Previously received iron infusions.  Now receiving B12 injections.  Hgb improved.  Working - Product manager.  Reports job going well.  No chest pain or sob reported.  No abdominal pain or bowel change reported.  No acid reflux.  Was previously having problems with constipation.  Saw Dutch Quint.  Instructed to take miralax.  No problem with constipation now.  Not taking miralax now.  Overall she feels she is doing well.    ROS: See pertinent positives and negatives per HPI.  Past Medical History:  Diagnosis Date   Anemia    not currently being treated for this   Arthritis    History of colon polyps    Hypertension     Past Surgical History:   Procedure Laterality Date   CHOLECYSTECTOMY  1989   COLONOSCOPY WITH PROPOFOL N/A 04/10/2017   Procedure: COLONOSCOPY WITH PROPOFOL;  Surgeon: Lin Landsman, MD;  Location: [facility] ENDOSCOPY;  Service: Gastroenterology;  Laterality: N/A;   ESOPHAGOGASTRODUODENOSCOPY (EGD) WITH PROPOFOL N/A 04/10/2017   Procedure: ESOPHAGOGASTRODUODENOSCOPY (EGD) WITH PROPOFOL;  Surgeon: Lin Landsman, MD;  Location: [facility] ENDOSCOPY;  Service: Gastroenterology;  Laterality: N/A;   GASTRIC BYPASS  03/21/03   JOINT REPLACEMENT Right 04/2014   TOTAL KNEE REPLACEMENT, DR. Rudene Christians, [place]   TOTAL KNEE ARTHROPLASTY Left 02/27/2016   Procedure: TOTAL KNEE ARTHROPLASTY;  Surgeon: Hessie Knows, MD;  Location: [facility] ORS;  Service: Orthopedics;  Laterality: Left;   TUBAL LIGATION  1976    Family History  Problem Relation Age of Onset   Hypertension Mother    Diabetes Mother    Cancer Maternal Aunt        question of type   Cancer Maternal Grandmother        colon   Breast cancer Cousin 74       maternal side    SOCIAL HX: reviewed.    Current Outpatient Medications:    lisinopril (ZESTRIL) 10 MG tablet, Take 1 tablet (10 mg total) by mouth daily., Disp: 90 tablet, Rfl: 0   polyethylene glycol powder (GLYCOLAX/MIRALAX) 17 GM/SCOOP powder, Take 17 g by mouth daily., Disp: 3350 g, Rfl: 1  EXAM:  GENERAL: alert, oriented, appears well and in no acute distress  HEENT: atraumatic, conjunttiva clear, no obvious abnormalities  on inspection of external nose and ears  NECK: normal movements of the head and neck  LUNGS: on inspection no signs of respiratory distress, breathing rate appears normal, no obvious gross SOB, gasping or wheezing  CV: no obvious cyanosis  PSYCH/NEURO: pleasant and cooperative, no obvious depression or anxiety, speech and thought processing grossly intact  ASSESSMENT AND PLAN:  Discussed the following assessment and plan:  Problem List Items Addressed This Visit     B12  deficiency    Receiving B12 injections.       Elevated alkaline phosphatase level    Recheck liver panel.       Essential hypertension, benign    Not checking blood pressures regularly.  States has been ok.  Continue lisinopril.  States taking regularly.  Follow pressures.  Follow metabolic panel.       Relevant Orders   Lipid panel   TSH   Basic metabolic panel   Hepatic function panel   Iron deficiency    Seeing hematology.  Last hgb 13.        Personal history of colonic polyps    Colonoscopy 04/10/17 - Non-thrombosed external hemorrhoids found on perianal exam. The examined portion of the ileum was normal. One 5 mm polyp in the transverse colon, removed with a cold snare. Resected and retrieved. Non-bleeding external hemorrhoids. Recommended f/u in 5 years (Dr Marius Ditch).       Thrombocytopenia ([place])    10/23/20 - platelet count wnl.       Vitamin D deficiency    Takes occasionally takes vitamin D supplements.  Needs vitamin D level rechecked.  Check with next labs.       Relevant Orders   VITAMIN D 25 Hydroxy (Vit-D Deficiency, Fractures)    Return in about 3 months (around 06/02/2021) for physical.   I discussed the assessment and treatment plan with the patient. The patient was provided an opportunity to ask questions and all were answered. The patient agreed with the plan and demonstrated an understanding of the instructions.   The patient was advised to call back or seek an in-person evaluation if the symptoms worsen or if the condition fails to improve as anticipated.    Einar Pheasant, MD

## 2021-03-06 ENCOUNTER — Telehealth: Payer: Self-pay | Admitting: Internal Medicine

## 2021-03-06 ENCOUNTER — Encounter: Payer: Self-pay | Admitting: Internal Medicine

## 2021-03-06 NOTE — Assessment & Plan Note (Signed)
Takes occasionally takes vitamin D supplements.  Needs vitamin D level rechecked.  Check with next labs.

## 2021-03-06 NOTE — Assessment & Plan Note (Signed)
Seeing hematology.  Last hgb 13.

## 2021-03-06 NOTE — Telephone Encounter (Signed)
Please schedule physical in 3 months and fasting lab appt in 2-3 weeks.  Thanks

## 2021-03-06 NOTE — Telephone Encounter (Signed)
Lm for patient to call and make a fasting lab in 2 to 3 weeks and schedule a physical in 3 months.

## 2021-03-06 NOTE — Assessment & Plan Note (Signed)
Recheck liver panel.  

## 2021-03-06 NOTE — Assessment & Plan Note (Signed)
Receiving B12 injections.

## 2021-03-06 NOTE — Assessment & Plan Note (Signed)
Not checking blood pressures regularly.  States has been ok.  Continue lisinopril.  States taking regularly.  Follow pressures.  Follow metabolic panel.

## 2021-03-06 NOTE — Assessment & Plan Note (Signed)
Colonoscopy 04/10/17 - Non-thrombosed external hemorrhoids found on perianal exam. The examined portion of the ileum was normal. One 5 mm polyp in the transverse colon, removed with a cold snare. Resected and retrieved. Non-bleeding external hemorrhoids. Recommended f/u in 5 years (Dr Vanga).  

## 2021-03-06 NOTE — Assessment & Plan Note (Signed)
10/23/20 - platelet count wnl.

## 2021-03-27 ENCOUNTER — Inpatient Hospital Stay: Payer: Medicare Other | Attending: Oncology

## 2021-03-27 ENCOUNTER — Other Ambulatory Visit: Payer: Self-pay

## 2021-03-27 DIAGNOSIS — E538 Deficiency of other specified B group vitamins: Secondary | ICD-10-CM | POA: Diagnosis not present

## 2021-03-27 DIAGNOSIS — E611 Iron deficiency: Secondary | ICD-10-CM

## 2021-03-27 MED ORDER — CYANOCOBALAMIN 1000 MCG/ML IJ SOLN
1000.0000 ug | Freq: Once | INTRAMUSCULAR | Status: AC
  Administered 2021-03-27: 1000 ug via INTRAMUSCULAR
  Filled 2021-03-27: qty 1

## 2021-04-24 ENCOUNTER — Inpatient Hospital Stay: Payer: Medicare Other | Attending: Oncology

## 2021-04-24 DIAGNOSIS — E538 Deficiency of other specified B group vitamins: Secondary | ICD-10-CM | POA: Diagnosis not present

## 2021-04-24 DIAGNOSIS — E611 Iron deficiency: Secondary | ICD-10-CM

## 2021-04-24 MED ORDER — CYANOCOBALAMIN 1000 MCG/ML IJ SOLN
1000.0000 ug | Freq: Once | INTRAMUSCULAR | Status: AC
  Administered 2021-04-24: 1000 ug via INTRAMUSCULAR
  Filled 2021-04-24: qty 1

## 2021-05-24 ENCOUNTER — Ambulatory Visit: Payer: Medicare Other | Admitting: Internal Medicine

## 2021-05-25 ENCOUNTER — Inpatient Hospital Stay: Payer: Medicare Other | Attending: Oncology

## 2021-05-25 DIAGNOSIS — E538 Deficiency of other specified B group vitamins: Secondary | ICD-10-CM | POA: Insufficient documentation

## 2021-05-25 DIAGNOSIS — E611 Iron deficiency: Secondary | ICD-10-CM

## 2021-05-25 MED ORDER — CYANOCOBALAMIN 1000 MCG/ML IJ SOLN
1000.0000 ug | Freq: Once | INTRAMUSCULAR | Status: AC
  Administered 2021-05-25: 1000 ug via INTRAMUSCULAR
  Filled 2021-05-25: qty 1

## 2021-06-26 ENCOUNTER — Inpatient Hospital Stay: Payer: Medicare Other | Attending: Oncology

## 2021-06-26 DIAGNOSIS — E611 Iron deficiency: Secondary | ICD-10-CM

## 2021-06-26 DIAGNOSIS — E538 Deficiency of other specified B group vitamins: Secondary | ICD-10-CM | POA: Insufficient documentation

## 2021-06-26 MED ORDER — CYANOCOBALAMIN 1000 MCG/ML IJ SOLN
1000.0000 ug | Freq: Once | INTRAMUSCULAR | Status: AC
  Administered 2021-06-26: 1000 ug via INTRAMUSCULAR
  Filled 2021-06-26: qty 1

## 2021-07-18 ENCOUNTER — Other Ambulatory Visit: Payer: Self-pay

## 2021-07-18 ENCOUNTER — Ambulatory Visit (INDEPENDENT_AMBULATORY_CARE_PROVIDER_SITE_OTHER): Payer: Medicare Other | Admitting: Internal Medicine

## 2021-07-18 ENCOUNTER — Encounter: Payer: Self-pay | Admitting: Internal Medicine

## 2021-07-18 VITALS — BP 150/92 | HR 80 | Temp 98.4°F | Resp 18 | Ht 64.0 in | Wt 240.4 lb

## 2021-07-18 DIAGNOSIS — D696 Thrombocytopenia, unspecified: Secondary | ICD-10-CM

## 2021-07-18 DIAGNOSIS — E559 Vitamin D deficiency, unspecified: Secondary | ICD-10-CM

## 2021-07-18 DIAGNOSIS — Z86010 Personal history of colonic polyps: Secondary | ICD-10-CM

## 2021-07-18 DIAGNOSIS — D649 Anemia, unspecified: Secondary | ICD-10-CM

## 2021-07-18 DIAGNOSIS — E538 Deficiency of other specified B group vitamins: Secondary | ICD-10-CM

## 2021-07-18 DIAGNOSIS — R0602 Shortness of breath: Secondary | ICD-10-CM | POA: Diagnosis not present

## 2021-07-18 DIAGNOSIS — R748 Abnormal levels of other serum enzymes: Secondary | ICD-10-CM

## 2021-07-18 DIAGNOSIS — E611 Iron deficiency: Secondary | ICD-10-CM

## 2021-07-18 DIAGNOSIS — I10 Essential (primary) hypertension: Secondary | ICD-10-CM | POA: Diagnosis not present

## 2021-07-18 LAB — LIPID PANEL
Cholesterol: 97 mg/dL (ref 0–200)
HDL: 49.6 mg/dL (ref >39.00)
LDL Cholesterol: 35 mg/dL (ref 0–99)
NonHDL: 47.88
Total CHOL/HDL Ratio: 2
Triglycerides: 64 mg/dL (ref 0.0–149.0)
VLDL: 12.8 mg/dL (ref 0.0–40.0)

## 2021-07-18 LAB — HEPATIC FUNCTION PANEL
ALT: 12 U/L (ref 0–35)
AST: 15 U/L (ref 0–37)
Albumin: 3.9 g/dL (ref 3.5–5.2)
Alkaline Phosphatase: 115 U/L (ref 39–117)
Bilirubin, Direct: 0.2 mg/dL (ref 0.0–0.3)
Total Bilirubin: 0.6 mg/dL (ref 0.2–1.2)
Total Protein: 7.3 g/dL (ref 6.0–8.3)

## 2021-07-18 LAB — BASIC METABOLIC PANEL
BUN: 12 mg/dL (ref 6–23)
CO2: 26 mEq/L (ref 19–32)
Calcium: 8.9 mg/dL (ref 8.4–10.5)
Chloride: 108 mEq/L (ref 96–112)
Creatinine, Ser: 0.67 mg/dL (ref 0.40–1.20)
GFR: 89.36 mL/min (ref >60.00)
Glucose, Bld: 99 mg/dL (ref 70–99)
Potassium: 3.6 mEq/L (ref 3.5–5.1)
Sodium: 141 mEq/L (ref 135–145)

## 2021-07-18 LAB — VITAMIN D 25 HYDROXY (VIT D DEFICIENCY, FRACTURES): VITD: 15.17 ng/mL — ABNORMAL LOW (ref 30.00–100.00)

## 2021-07-18 LAB — TSH: TSH: 1.58 u[IU]/mL (ref 0.35–5.50)

## 2021-07-18 MED ORDER — AMLODIPINE BESYLATE 5 MG PO TABS
5.0000 mg | ORAL_TABLET | Freq: Every day | ORAL | 1 refills | Status: DC
  Filled 2021-07-18: qty 90, 90d supply, fill #0

## 2021-07-18 NOTE — Progress Notes (Signed)
[name], female   DOB: 1950/08/25, 70 y.o.   MRN: [id number]   Subjective:    [name], female    DOB: June 30, 1950, 71 y.o.   MRN: [id number]  Patient here for a scheduled follow up.   Chief Complaint  Patient presents with   Hypertension   Weight Loss   .   HPI Is not taking her blood pressure medication regularly.  Blood pressure elevated.  Discussed importance of taking medication regularly.  Is walking.  Reports increased sob with increased exertion.  No cough or congestion.  No chest pain.  Concerned regarding weight.  Discussed diet and exercise.  No abdominal pain.  Bowels moving.  Blood pressure elevated.    Past Medical History:  Diagnosis Date   Anemia    not currently being treated for this   Arthritis    History of colon polyps    Hypertension    Past Surgical History:  Procedure Laterality Date   CHOLECYSTECTOMY  1989   COLONOSCOPY WITH PROPOFOL N/A 04/10/2017   Procedure: COLONOSCOPY WITH PROPOFOL;  Surgeon: Lin Landsman, MD;  Location: [facility] ENDOSCOPY;  Service: Gastroenterology;  Laterality: N/A;   ESOPHAGOGASTRODUODENOSCOPY (EGD) WITH PROPOFOL N/A 04/10/2017   Procedure: ESOPHAGOGASTRODUODENOSCOPY (EGD) WITH PROPOFOL;  Surgeon: Lin Landsman, MD;  Location: [facility] ENDOSCOPY;  Service: Gastroenterology;  Laterality: N/A;   GASTRIC BYPASS  03/21/03   JOINT REPLACEMENT Right 04/2014   TOTAL KNEE REPLACEMENT, DR. Rudene Christians, [place]   TOTAL KNEE ARTHROPLASTY Left 02/27/2016   Procedure: TOTAL KNEE ARTHROPLASTY;  Surgeon: Hessie Knows, MD;  Location: [facility] ORS;  Service: Orthopedics;  Laterality: Left;   TUBAL LIGATION  1976   Family History  Problem Relation Age of Onset   Hypertension Mother    Diabetes Mother    Cancer Maternal Aunt        question of type   Cancer Maternal Grandmother        colon   Breast cancer Cousin 27       maternal side   Social History   Socioeconomic History   Marital status: Married    Spouse  name: Not on file   Number of children: 2   Years of education: Not on file   Highest education level: Not on file  Occupational History    Employer: [employer]  Tobacco Use   Smoking status: Never   Smokeless tobacco: Never  Vaping Use   Vaping Use: Never used  Substance and Sexual Activity   Alcohol use: No    Alcohol/week: 0.0 standard drinks of alcohol   Drug use: No   Sexual activity: Yes  Other Topics Concern   Not on file  Social History Narrative   Not on file   Social Determinants of Health   Financial Resource Strain: Low Risk  (01/09/2021)   Overall Financial Resource Strain (CARDIA)    Difficulty of Paying Living Expenses: Not hard at all  Food Insecurity: No Food Insecurity (01/09/2021)   Hunger Vital Sign    Worried About Running Out of Food in the Last Year: Never true    Ran Out of Food in the Last Year: Never true  Transportation Needs: No Transportation Needs (01/09/2021)   PRAPARE - Hydrologist (Medical): No    Lack of Transportation (Non-Medical): No  Physical Activity: Sufficiently Active (01/09/2021)   Exercise Vital Sign    Days of Exercise per Week: 5 days    Minutes  of Exercise per Session: 30 min  Stress: No Stress Concern Present (01/09/2021)   [place]    Feeling of Stress : Not at all  Social Connections: Unknown (01/09/2021)   Social Connection and Isolation Panel [NHANES]    Frequency of Communication with Friends and Family: More than three times a week    Frequency of Social Gatherings with Friends and Family: More than three times a week    Attends Religious Services: Not on Advertising copywriter or Organizations: Not on file    Attends Archivist Meetings: Not on file    Marital Status: Not on file     Review of Systems  Constitutional:  Negative for appetite change and unexpected weight change.  HENT:  Negative for  congestion and sinus pressure.   Respiratory:  Positive for shortness of breath. Negative for cough and chest tightness.   Cardiovascular:  Negative for chest pain and palpitations.  Gastrointestinal:  Negative for abdominal pain, diarrhea, nausea and vomiting.  Genitourinary:  Negative for difficulty urinating and dysuria.  Musculoskeletal:  Negative for joint swelling and myalgias.  Skin:  Negative for color change and rash.  Neurological:  Negative for dizziness, light-headedness and headaches.  Psychiatric/Behavioral:  Negative for agitation and dysphoric mood.        Objective:     BP (!) 150/92 (BP Location: Left Arm, Patient Position: Sitting, Cuff Size: Large)   Pulse 80   Temp 98.4 F (36.9 C) (Temporal)   Resp 18   Ht '5\' 4"'$  (1.626 m)   Wt 240 lb 6.4 oz (109 kg)   LMP 04/06/2009   SpO2 96%   BMI 41.26 kg/m  Wt Readings from Last 3 Encounters:  07/18/21 240 lb 6.4 oz (109 kg)  03/05/21 230 lb (104.3 kg)  01/09/21 233 lb (105.7 kg)    Physical Exam Vitals reviewed.  Constitutional:      General: She is not in acute distress.    Appearance: Normal appearance.  HENT:     Head: Normocephalic and atraumatic.     Right Ear: External ear normal.     Left Ear: External ear normal.  Eyes:     General: No scleral icterus.       Right eye: No discharge.        Left eye: No discharge.     Conjunctiva/sclera: Conjunctivae normal.  Neck:     Thyroid: No thyromegaly.  Cardiovascular:     Rate and Rhythm: Normal rate and regular rhythm.  Pulmonary:     Effort: No respiratory distress.     Breath sounds: Normal breath sounds. No wheezing.  Abdominal:     General: Bowel sounds are normal.     Palpations: Abdomen is soft.     Tenderness: There is no abdominal tenderness.  Musculoskeletal:        General: No swelling or tenderness.     Cervical back: Neck supple. No tenderness.  Lymphadenopathy:     Cervical: No cervical adenopathy.  Skin:    Findings: No erythema  or rash.  Neurological:     Mental Status: She is alert.  Psychiatric:        Mood and Affect: Mood normal.        Behavior: Behavior normal.      Outpatient Encounter Medications as of 07/18/2021  Medication Sig   amLODipine (NORVASC) 5 MG tablet Take 1 tablet (5 mg total) by  mouth daily.   polyethylene glycol powder (GLYCOLAX/MIRALAX) 17 GM/SCOOP powder Take 17 g by mouth daily. (Patient taking differently: Take 17 g by mouth daily. PRN)   [DISCONTINUED] lisinopril (ZESTRIL) 10 MG tablet Take 1 tablet (10 mg total) by mouth daily.   No facility-administered encounter medications on file as of 07/18/2021.     Lab Results  Component Value Date   WBC 6.0 10/23/2020   HGB 13.0 10/23/2020   HCT 39.6 10/23/2020   PLT 214 10/23/2020   GLUCOSE 99 07/18/2021   CHOL 97 07/18/2021   TRIG 64.0 07/18/2021   HDL 49.60 07/18/2021   LDLCALC 35 07/18/2021   ALT 12 07/18/2021   AST 15 07/18/2021   NA 141 07/18/2021   K 3.6 07/18/2021   CL 108 07/18/2021   CREATININE 0.67 07/18/2021   BUN 12 07/18/2021   CO2 26 07/18/2021   TSH 1.58 07/18/2021   INR 1.14 02/21/2016   HGBA1C 5.6 03/25/2019    Mammogram 3D SCREEN BREAST BILATERAL  Result Date: 03/05/2021 CLINICAL DATA:  Screening. EXAM: DIGITAL SCREENING BILATERAL MAMMOGRAM WITH TOMOSYNTHESIS AND CAD TECHNIQUE: Bilateral screening digital craniocaudal and mediolateral oblique mammograms were obtained. Bilateral screening digital breast tomosynthesis was performed. The images were evaluated with computer-aided detection. COMPARISON:  Previous exam(s). ACR Breast Density Category b: There are scattered areas of fibroglandular density. FINDINGS: There are no findings suspicious for malignancy. IMPRESSION: No mammographic evidence of malignancy. A result letter of this screening mammogram will be mailed directly to the patient. RECOMMENDATION: Screening mammogram in one year. (Code:SM-B-01Y) BI-RADS CATEGORY  1: Negative. Electronically Signed    By: Lillia Mountain M.D.   On: 03/05/2021 12:33      Assessment & Plan:   Problem List Items Addressed This Visit     Anemia    Has seen GI.  Being followed by hematology.  Has had iron infusions previously.  Follow cbc.        B12 deficiency    Receiving b12 injections.       Elevated alkaline phosphatase level    Recheck liver panel.       Relevant Orders   Hepatic function panel (Completed)   Essential hypertension, benign - Primary    Not taking lisinopril on a regular basis.  Discussed taking amlodipine.  Will stop lisinopril.  Check metabolic panel.  Follow pressures.        Relevant Medications   amLODipine (NORVASC) 5 MG tablet   Other Relevant Orders   Basic metabolic panel (Completed)   Lipid panel (Completed)   TSH (Completed)   Iron deficiency    Seeing hematology.  Following cbc.       Personal history of colonic polyps    Colonoscopy 04/10/17 - Non-thrombosed external hemorrhoids found on perianal exam. The examined portion of the ileum was normal. One 5 mm polyp in the transverse colon, removed with a cold snare. Resected and retrieved. Non-bleeding external hemorrhoids. Recommended f/u in 5 years (Dr Marius Ditch).       SOB (shortness of breath) on exertion    SOB on exertion as outlined.  Has been walking.  SOB not improving.  EKG - SR no acute ischemic changes.  Given risk factors and given sob with exertion, will refer to cardiology for further w/up and evaluation       Relevant Orders   EKG 12-Lead (Completed)   Ambulatory referral to Cardiology   Thrombocytopenia ([place])    Check cbc.       Vitamin D  deficiency    Check vitamin D level.       Relevant Orders   VITAMIN D 25 Hydroxy (Vit-D Deficiency, Fractures) (Completed)     Einar Pheasant, MD

## 2021-07-19 ENCOUNTER — Telehealth: Payer: Self-pay

## 2021-07-19 NOTE — Telephone Encounter (Signed)
Patient called to state she is returning our call.  I transferred call to Denita Lung, [place].

## 2021-07-24 ENCOUNTER — Encounter: Payer: Self-pay | Admitting: Internal Medicine

## 2021-07-24 NOTE — Assessment & Plan Note (Signed)
Has seen GI.  Being followed by hematology.  Has had iron infusions previously.  Follow cbc.

## 2021-07-24 NOTE — Assessment & Plan Note (Signed)
Recheck liver panel.  

## 2021-07-24 NOTE — Assessment & Plan Note (Signed)
Colonoscopy 04/10/17 - Non-thrombosed external hemorrhoids found on perianal exam. The examined portion of the ileum was normal. One 5 mm polyp in the transverse colon, removed with a cold snare. Resected and retrieved. Non-bleeding external hemorrhoids. Recommended f/u in 5 years (Dr Marius Ditch).

## 2021-07-24 NOTE — Assessment & Plan Note (Signed)
SOB on exertion as outlined.  Has been walking.  SOB not improving.  EKG - SR no acute ischemic changes.  Given risk factors and given sob with exertion, will refer to cardiology for further w/up and evaluation

## 2021-07-24 NOTE — Assessment & Plan Note (Signed)
Check cbc 

## 2021-07-24 NOTE — Assessment & Plan Note (Signed)
Seeing hematology.  Following cbc.

## 2021-07-24 NOTE — Assessment & Plan Note (Signed)
Not taking lisinopril on a regular basis.  Discussed taking amlodipine.  Will stop lisinopril.  Check metabolic panel.  Follow pressures.

## 2021-07-24 NOTE — Assessment & Plan Note (Signed)
Check vitamin D level 

## 2021-07-24 NOTE — Assessment & Plan Note (Signed)
Receiving b12 injections.

## 2021-07-26 ENCOUNTER — Inpatient Hospital Stay: Payer: Medicare Other | Attending: Oncology

## 2021-07-26 DIAGNOSIS — E538 Deficiency of other specified B group vitamins: Secondary | ICD-10-CM | POA: Insufficient documentation

## 2021-07-26 DIAGNOSIS — E611 Iron deficiency: Secondary | ICD-10-CM

## 2021-07-26 MED ORDER — CYANOCOBALAMIN 1000 MCG/ML IJ SOLN
1000.0000 ug | Freq: Once | INTRAMUSCULAR | Status: AC
  Administered 2021-07-26: 1000 ug via INTRAMUSCULAR
  Filled 2021-07-26: qty 1

## 2021-08-21 ENCOUNTER — Other Ambulatory Visit: Payer: Self-pay

## 2021-08-21 NOTE — Patient Outreach (Signed)
  Care Coordination   Initial Visit Note   08/21/2021 Name: Kelly Wallace MRN: [id number] DOB: 11/20/1950  Kelly Wallace is a 71 y.o. year old female who sees Einar Pheasant, MD for primary care. I spoke with  [name] by phone today  What matters to the patients health and wellness today?  Patient states she is concerned about her blood pressure and weight.    Goals Addressed             This Visit's Progress    Patient Stated:  I'm concerned about my weight and blood pressure       Care Coordination Interventions: Evaluation of current treatment plan related to hypertension / weight loss self management and patient's adherence to plan as established by provider Reviewed medications with patient and discussed importance of compliance Discussed plans with patient for ongoing care management follow up and provided patient with direct contact information for care management team Advised patient, providing education and rationale, to monitor blood pressure daily and record, calling PCP for findings outside established parameters Reviewed scheduled/upcoming provider appointments  Mailed patient education article on High Blood pressure management and weight loss          SDOH assessments and interventions completed:   Yes SDOH Interventions Today    Flowsheet Row Most Recent Value  SDOH Interventions   Food Insecurity Interventions Intervention Not Indicated  Housing Interventions Intervention Not Indicated  Transportation Interventions Intervention Not Indicated       Care Coordination Interventions Activated:  Yes Care Coordination Interventions:  Yes, provided  Follow up plan: Follow up call scheduled for 09/04/21 at 2:30 pm  Encounter Outcome:  Pt. Scheduled  Quinn Plowman RN,BSN,CCM [place] Coordinator [phone]

## 2021-08-21 NOTE — Patient Instructions (Signed)
Visit Information  Thank you for taking time to visit with me today. Please don't hesitate to contact me if I can be of assistance to you.   Following are the goals we discussed today:   Goals Addressed             This Visit's Progress    Patient Stated:  I'm concerned about my weight and blood pressure       Care Coordination Interventions: Evaluation of current treatment plan related to hypertension / weight loss self management and patient's adherence to plan as established by provider Reviewed medications with patient and discussed importance of compliance Discussed plans with patient for ongoing care management follow up and provided patient with direct contact information for care management team Advised patient, providing education and rationale, to monitor blood pressure daily and record, calling PCP for findings outside established parameters Reviewed scheduled/upcoming provider appointments  Mailed patient education article on High Blood pressure management and weight loss          Our next appointment is by telephone on 09/04/21 at 2:30 PM  Please call the care guide team at [phone] if you need to cancel or reschedule your appointment.   If you are experiencing a Mental Health or [place] or need someone to talk to, please call the Suicide and Crisis Lifeline: 988 call 1-800-273-TALK (toll free, 24 hour hotline)  The patient verbalized understanding of instructions, educational materials, and care plan provided today and agreed to receive a mailed copy of patient instructions, educational materials, and care plan.   Quinn Plowman RN,BSN,CCM RN Care Manager Coordinator [phone]

## 2021-08-25 ENCOUNTER — Other Ambulatory Visit: Payer: Self-pay

## 2021-08-25 ENCOUNTER — Emergency Department: Payer: Medicare Other

## 2021-08-25 ENCOUNTER — Inpatient Hospital Stay
Admission: EM | Admit: 2021-08-25 | Discharge: 2021-08-27 | DRG: 872 | Disposition: A | Discharge status: Home / Self Care | Payer: Medicare Other | Attending: Internal Medicine | Admitting: Internal Medicine

## 2021-08-25 DIAGNOSIS — J101 Influenza due to other identified influenza virus with other respiratory manifestations: Secondary | ICD-10-CM | POA: Diagnosis not present

## 2021-08-25 DIAGNOSIS — Z20822 Contact with and (suspected) exposure to covid-19: Secondary | ICD-10-CM | POA: Diagnosis not present

## 2021-08-25 DIAGNOSIS — I872 Venous insufficiency (chronic) (peripheral): Secondary | ICD-10-CM | POA: Diagnosis present

## 2021-08-25 DIAGNOSIS — A4189 Other specified sepsis: Principal | ICD-10-CM | POA: Diagnosis present

## 2021-08-25 DIAGNOSIS — R6520 Severe sepsis without septic shock: Secondary | ICD-10-CM | POA: Diagnosis not present

## 2021-08-25 DIAGNOSIS — R059 Cough, unspecified: Secondary | ICD-10-CM | POA: Diagnosis not present

## 2021-08-25 DIAGNOSIS — Z8249 Family history of ischemic heart disease and other diseases of the circulatory system: Secondary | ICD-10-CM

## 2021-08-25 DIAGNOSIS — I10 Essential (primary) hypertension: Secondary | ICD-10-CM | POA: Diagnosis present

## 2021-08-25 DIAGNOSIS — R062 Wheezing: Secondary | ICD-10-CM | POA: Diagnosis not present

## 2021-08-25 DIAGNOSIS — A419 Sepsis, unspecified organism: Secondary | ICD-10-CM | POA: Diagnosis present

## 2021-08-25 DIAGNOSIS — E872 Acidosis, unspecified: Secondary | ICD-10-CM | POA: Diagnosis not present

## 2021-08-25 DIAGNOSIS — Z86010 Personal history of colonic polyps: Secondary | ICD-10-CM | POA: Diagnosis not present

## 2021-08-25 DIAGNOSIS — J209 Acute bronchitis, unspecified: Secondary | ICD-10-CM | POA: Diagnosis present

## 2021-08-25 DIAGNOSIS — Z96653 Presence of artificial knee joint, bilateral: Secondary | ICD-10-CM | POA: Diagnosis not present

## 2021-08-25 DIAGNOSIS — J208 Acute bronchitis due to other specified organisms: Secondary | ICD-10-CM | POA: Diagnosis present

## 2021-08-25 DIAGNOSIS — Z803 Family history of malignant neoplasm of breast: Secondary | ICD-10-CM

## 2021-08-25 DIAGNOSIS — Z79899 Other long term (current) drug therapy: Secondary | ICD-10-CM

## 2021-08-25 DIAGNOSIS — J40 Bronchitis, not specified as acute or chronic: Secondary | ICD-10-CM | POA: Diagnosis not present

## 2021-08-25 DIAGNOSIS — Z833 Family history of diabetes mellitus: Secondary | ICD-10-CM

## 2021-08-25 DIAGNOSIS — R0602 Shortness of breath: Secondary | ICD-10-CM | POA: Diagnosis not present

## 2021-08-25 DIAGNOSIS — Z9049 Acquired absence of other specified parts of digestive tract: Secondary | ICD-10-CM | POA: Diagnosis not present

## 2021-08-25 LAB — CBC WITH DIFFERENTIAL/PLATELET
Abs Immature Granulocytes: 0.03 10*3/uL (ref 0.00–0.07)
Basophils Absolute: 0 10*3/uL (ref 0.0–0.1)
Basophils Relative: 0 %
Eosinophils Absolute: 0.1 10*3/uL (ref 0.0–0.5)
Eosinophils Relative: 2 %
HCT: 40.7 % (ref 36.0–46.0)
Hemoglobin: 13.4 g/dL (ref 12.0–15.0)
Immature Granulocytes: 1 %
Lymphocytes Relative: 13 %
Lymphs Abs: 0.7 10*3/uL (ref 0.7–4.0)
MCH: 30.5 pg (ref 26.0–34.0)
MCHC: 32.9 g/dL (ref 30.0–36.0)
MCV: 92.5 fL (ref 80.0–100.0)
Monocytes Absolute: 0.4 10*3/uL (ref 0.1–1.0)
Monocytes Relative: 7 %
Neutro Abs: 4.5 10*3/uL (ref 1.7–7.7)
Neutrophils Relative %: 77 %
Platelets: 166 10*3/uL (ref 150–400)
RBC: 4.4 MIL/uL (ref 3.87–5.11)
RDW: 13.2 % (ref 11.5–15.5)
WBC: 5.8 10*3/uL (ref 4.0–10.5)
nRBC: 0 % (ref 0.0–0.2)

## 2021-08-25 LAB — COMPREHENSIVE METABOLIC PANEL
ALT: 22 U/L (ref 0–44)
AST: 30 U/L (ref 15–41)
Albumin: 3.9 g/dL (ref 3.5–5.0)
Alkaline Phosphatase: 126 U/L (ref 38–126)
Anion gap: 7 (ref 5–15)
BUN: 11 mg/dL (ref 8–23)
CO2: 23 mmol/L (ref 22–32)
Calcium: 8.4 mg/dL — ABNORMAL LOW (ref 8.9–10.3)
Chloride: 111 mmol/L (ref 98–111)
Creatinine, Ser: 0.79 mg/dL (ref 0.44–1.00)
GFR, Estimated: >60 mL/min (ref >60)
Glucose, Bld: 121 mg/dL — ABNORMAL HIGH (ref 70–99)
Potassium: 3.8 mmol/L (ref 3.5–5.1)
Sodium: 141 mmol/L (ref 135–145)
Total Bilirubin: 1.1 mg/dL (ref 0.3–1.2)
Total Protein: 7.6 g/dL (ref 6.5–8.1)

## 2021-08-25 LAB — PROCALCITONIN: Procalcitonin: <0.1 ng/mL

## 2021-08-25 LAB — TROPONIN I (HIGH SENSITIVITY)
Troponin I (High Sensitivity): 5 ng/L (ref <18)
Troponin I (High Sensitivity): 5 ng/L (ref <18)

## 2021-08-25 LAB — SARS CORONAVIRUS 2 BY RT PCR: SARS Coronavirus 2 by RT PCR: NEGATIVE

## 2021-08-25 LAB — BRAIN NATRIURETIC PEPTIDE: B Natriuretic Peptide: 86.7 pg/mL (ref 0.0–100.0)

## 2021-08-25 LAB — LACTIC ACID, PLASMA: Lactic Acid, Venous: 2.4 mmol/L (ref 0.5–1.9)

## 2021-08-25 MED ORDER — IPRATROPIUM-ALBUTEROL 0.5-2.5 (3) MG/3ML IN SOLN
3.0000 mL | RESPIRATORY_TRACT | Status: AC
  Administered 2021-08-25: 3 mL via RESPIRATORY_TRACT
  Filled 2021-08-25: qty 3

## 2021-08-25 MED ORDER — ACETAMINOPHEN 325 MG PO TABS
650.0000 mg | ORAL_TABLET | Freq: Once | ORAL | Status: AC | PRN
  Administered 2021-08-25: 650 mg via ORAL
  Filled 2021-08-25: qty 2

## 2021-08-25 MED ORDER — DOXYCYCLINE HYCLATE 100 MG PO TABS
100.0000 mg | ORAL_TABLET | Freq: Once | ORAL | Status: AC
Start: 2021-08-25 — End: 2021-08-25
  Administered 2021-08-25: 100 mg via ORAL
  Filled 2021-08-25: qty 1

## 2021-08-25 MED ORDER — IBUPROFEN 400 MG PO TABS
400.0000 mg | ORAL_TABLET | Freq: Once | ORAL | Status: AC
  Administered 2021-08-25: 400 mg via ORAL
  Filled 2021-08-25: qty 1

## 2021-08-25 MED ORDER — SODIUM CHLORIDE 0.9 % IV SOLN
1.0000 g | Freq: Once | INTRAVENOUS | Status: AC
  Administered 2021-08-25: 1 g via INTRAVENOUS
  Filled 2021-08-25: qty 10

## 2021-08-25 MED ORDER — IPRATROPIUM-ALBUTEROL 0.5-2.5 (3) MG/3ML IN SOLN
3.0000 mL | Freq: Once | RESPIRATORY_TRACT | Status: AC
  Administered 2021-08-25: 3 mL via RESPIRATORY_TRACT
  Filled 2021-08-25: qty 3

## 2021-08-25 MED ORDER — LACTATED RINGERS IV BOLUS
1000.0000 mL | Freq: Once | INTRAVENOUS | Status: AC
  Administered 2021-08-26: 1000 mL via INTRAVENOUS

## 2021-08-25 MED ORDER — LACTATED RINGERS IV BOLUS
1000.0000 mL | Freq: Once | INTRAVENOUS | Status: AC
  Administered 2021-08-25: 1000 mL via INTRAVENOUS

## 2021-08-25 MED ORDER — BENZONATATE 100 MG PO CAPS
100.0000 mg | ORAL_CAPSULE | Freq: Once | ORAL | Status: AC
  Administered 2021-08-25: 100 mg via ORAL
  Filled 2021-08-25: qty 1

## 2021-08-25 MED ORDER — LACTATED RINGERS IV SOLN: INTRAVENOUS | Status: AC

## 2021-08-25 MED ORDER — METHYLPREDNISOLONE SODIUM SUCC 125 MG IJ SOLR
125.0000 mg | Freq: Once | INTRAMUSCULAR | Status: AC
  Administered 2021-08-25: 125 mg via INTRAVENOUS
  Filled 2021-08-25: qty 2

## 2021-08-25 NOTE — Sepsis Progress Note (Signed)
Elink following for Sepsis Protocol 

## 2021-08-25 NOTE — ED Provider Notes (Signed)
[facility] Provider Note    Event Date/Time   First MD Initiated Contact with Patient 08/25/21 1959     (approximate)   History   Shortness of Breath and Cough   HPI  Kelly Wallace is a 71 y.o. female with a past history of HTN, arthritis and anemia who presents for evaluation of shortness of breath and cough starting yesterday.  She has some chest tightness on coughing but otherwise no chest pain.  She denies any headache, earache, sore throat, nausea, vomiting, diarrhea, abdominal pain, back pain or urinary symptoms.  No recent rashes or falls or injuries.  She denies any history of tobacco abuse, COPD, asthma or CHF.    Past Medical History:  Diagnosis Date   Anemia    not currently being treated for this   Arthritis    History of colon polyps    Hypertension      Physical Exam  Triage Vital Signs: ED Triage Vitals  Enc Vitals Group     BP 08/25/21 1952 (!) 181/92     Pulse Rate 08/25/21 1952 88     Resp 08/25/21 1952 (!) 24     Temp 08/25/21 1952 (!) 100.8 F (38.2 C)     Temp Source 08/25/21 1952 Oral     SpO2 08/25/21 1952 95 %     Weight 08/25/21 1951 225 lb (102.1 kg)     Height 08/25/21 1951 '5\' 5"'$  (1.651 m)     Head Circumference --      Peak Flow --      Pain Score 08/25/21 1951 0     Pain Loc --      Pain Edu? --      Excl. in [place]? --     Most recent vital signs: Vitals:   08/25/21 2200 08/25/21 2300  BP:  130/71  Pulse:  95  Resp:  19  Temp: (!) 100.5 F (38.1 C)   SpO2:  94%    General: Awake, no distress.  CV:  Good peripheral perfusion.  2+ radial pulse.  No significant murmur. Resp:  Normal effort.  Tachypneic with some wheezing and rhonchi bilaterally. Abd:  No distention.  Soft throughout. Other:  Minimal symmetric lower extremity edema   ED Results / Procedures / Treatments  Labs (all labs ordered are listed, but only abnormal results are displayed) Labs Reviewed  COMPREHENSIVE METABOLIC PANEL -  Abnormal; Notable for the following components:      Result Value   Glucose, Bld 121 (*)    Calcium 8.4 (*)    All other components within normal limits  LACTIC ACID, PLASMA - Abnormal; Notable for the following components:   Lactic Acid, Venous 2.4 (*)    All other components within normal limits  SARS CORONAVIRUS 2 BY RT PCR  CULTURE, BLOOD (ROUTINE X 2)  CULTURE, BLOOD (ROUTINE X 2)  CBC WITH DIFFERENTIAL/PLATELET  PROCALCITONIN  BRAIN NATRIURETIC PEPTIDE  LACTIC ACID, PLASMA  TROPONIN I (HIGH SENSITIVITY)  TROPONIN I (HIGH SENSITIVITY)     EKG  EKG is remarkable sinus rhythm with a ventricular rate of 86, normal axis, unremarkable intervals without evidence of acute ischemia or significant arrhythmia.   RADIOLOGY Chest reviewed by myself shows no focal consoidation, effusion, edema, pneumothorax or other clear acute thoracic process. I also reviewed radiology interpretation and agree with findings described.    PROCEDURES:  Critical Care performed: Yes, see critical care procedure note(s)  .Critical Care  Performed by:  Lucrezia Starch, MD Authorized by: Lucrezia Starch, MD   Critical care provider statement:    Critical care time (minutes):  30   Critical care was necessary to treat or prevent imminent or life-threatening deterioration of the following conditions:  Sepsis   Critical care was time spent personally by me on the following activities:  Development of treatment plan with patient or surrogate, discussions with consultants, evaluation of patient's response to treatment, examination of patient, ordering and review of laboratory studies, ordering and review of radiographic studies, ordering and performing treatments and interventions, pulse oximetry, re-evaluation of patient's condition and review of old charts     MEDICATIONS ORDERED IN ED: Medications  lactated ringers bolus 1,000 mL (has no administration in time range)  lactated ringers infusion (has  no administration in time range)  acetaminophen (TYLENOL) tablet 650 mg (650 mg Oral Given 08/25/21 2014)  benzonatate (TESSALON) capsule 100 mg (100 mg Oral Given 08/25/21 2105)  ipratropium-albuterol (DUONEB) 0.5-2.5 (3) MG/3ML nebulizer solution 3 mL (3 mLs Nebulization Given 08/25/21 2105)  doxycycline (VIBRA-TABS) tablet 100 mg (100 mg Oral Given 08/25/21 2105)  ipratropium-albuterol (DUONEB) 0.5-2.5 (3) MG/3ML nebulizer solution 3 mL (3 mLs Nebulization Given 08/25/21 2214)  methylPREDNISolone sodium succinate (SOLU-MEDROL) 125 mg/2 mL injection 125 mg (125 mg Intravenous Given 08/25/21 2207)  ibuprofen (ADVIL) tablet 400 mg (400 mg Oral Given 08/25/21 2207)  lactated ringers bolus 1,000 mL (1,000 mLs Intravenous New Bag/Given 08/25/21 2303)  cefTRIAXone (ROCEPHIN) 1 g in sodium chloride 0.9 % 100 mL IVPB (0 g Intravenous Stopped 08/25/21 2300)     IMPRESSION / MDM / [place] / ED COURSE  I reviewed the triage vital signs and the nursing notes. Patient's presentation is most consistent with acute presentation with potential threat to life or bodily function.                               Differential diagnosis includes, but is not limited to, bronchitis versus actual pneumonia, CHF, arrhythmia, electrolyte derangements, anemia.  She was ambulated on room air and became slightly tachycardic and tachypneic but not hypoxic.  No evidence of hypoxic restaurant failure at this time.  EKG is remarkable sinus rhythm with a ventricular rate of 86, normal axis, unremarkable intervals without evidence of acute ischemia or significant arrhythmia.  Nonelevated troponin x2 is not suggestive of ACS or myocarditis.  Chest reviewed by myself shows no focal consoidation, effusion, edema, pneumothorax or other clear acute thoracic process. I also reviewed radiology interpretation and agree with findings described.  CBC without leukocytosis or acute anemia.  CMP without any significant electrolyte or metabolic  derangements.  COVID PCR is negative.  Procalcitonin undetectable.  BNP 86 and not suggestive of CHF.  Initial lactic acid of 2.4  Mother no clear consolidations on patient's chest x-ray she does have wheezing and rhonchi as well as fever elevated lactic acid and tachypnea on arrival this is concerning for sepsis from primary pulmonary source.  I questioned her extensively about any tobacco use and she is adamant she denies this although her husband used to smoke and wondering if she could have some undiagnosed obstructive airway disease from secondhand smoke.  She did respond fairly well to some DuoNebs and steroids and I did start her on Rocephin and doxycycline for possible obstructive airway disease exacerbation and bacterial pneumonia.  She is not hypoxic but when I attempted to ambulate her she became  quite short of breath after 30 seconds with a respiratory rate of 40 and a heart rate of 115.  Given severity of her symptoms at this time and concern for possible sepsis she will be admitted to medicine service for further evaluation and management.     FINAL CLINICAL IMPRESSION(S) / ED DIAGNOSES   Final diagnoses:  Bronchitis  Sepsis, due to unspecified organism, unspecified whether acute organ dysfunction present [facility])  Wheezing     Rx / DC Orders   ED Discharge Orders     None        Note:  This document was prepared using Dragon voice recognition software and may include unintentional dictation errors.   Lucrezia Starch, MD 08/25/21 [phone]

## 2021-08-25 NOTE — Progress Notes (Signed)
CODE SEPSIS - PHARMACY COMMUNICATION  **Broad Spectrum Antibiotics should be administered within 1 hour of Sepsis diagnosis**  Time Code Sepsis Called/Page Received: 8/5 @ 2315   Antibiotics Ordered: Doxycycline , Ceftriaxone   Time of 1st antibiotic administration: Doxycycline 100 mg PO X 1   Additional action taken by pharmacy:   If necessary, Name of Provider/Nurse Contacted:     Otelia Hettinger D ,PharmD Clinical Pharmacist  08/25/2021  11:51 PM

## 2021-08-25 NOTE — ED Triage Notes (Signed)
Pt presents to ER c/o sob and cough that is intermittently productive.  Pt states symptoms started yesterday.  Pt endorses some chest pain when coughing.  Pt denies fevers or sick contacts.  Pt noted with cough in triage and occasional wheezing.  Pt is A&O x4.

## 2021-08-25 NOTE — ED Notes (Signed)
Pt given apple juice to drink

## 2021-08-26 ENCOUNTER — Encounter: Payer: Self-pay | Admitting: Internal Medicine

## 2021-08-26 DIAGNOSIS — J208 Acute bronchitis due to other specified organisms: Secondary | ICD-10-CM

## 2021-08-26 DIAGNOSIS — Z96653 Presence of artificial knee joint, bilateral: Secondary | ICD-10-CM | POA: Diagnosis present

## 2021-08-26 DIAGNOSIS — R6520 Severe sepsis without septic shock: Secondary | ICD-10-CM | POA: Diagnosis not present

## 2021-08-26 DIAGNOSIS — E872 Acidosis, unspecified: Secondary | ICD-10-CM

## 2021-08-26 DIAGNOSIS — Z833 Family history of diabetes mellitus: Secondary | ICD-10-CM | POA: Diagnosis not present

## 2021-08-26 DIAGNOSIS — Z20822 Contact with and (suspected) exposure to covid-19: Secondary | ICD-10-CM | POA: Diagnosis present

## 2021-08-26 DIAGNOSIS — J101 Influenza due to other identified influenza virus with other respiratory manifestations: Secondary | ICD-10-CM | POA: Diagnosis present

## 2021-08-26 DIAGNOSIS — I872 Venous insufficiency (chronic) (peripheral): Secondary | ICD-10-CM | POA: Diagnosis present

## 2021-08-26 DIAGNOSIS — I10 Essential (primary) hypertension: Secondary | ICD-10-CM | POA: Diagnosis present

## 2021-08-26 DIAGNOSIS — A419 Sepsis, unspecified organism: Secondary | ICD-10-CM

## 2021-08-26 DIAGNOSIS — Z86010 Personal history of colonic polyps: Secondary | ICD-10-CM | POA: Diagnosis not present

## 2021-08-26 DIAGNOSIS — Z803 Family history of malignant neoplasm of breast: Secondary | ICD-10-CM | POA: Diagnosis not present

## 2021-08-26 DIAGNOSIS — Z79899 Other long term (current) drug therapy: Secondary | ICD-10-CM | POA: Diagnosis not present

## 2021-08-26 DIAGNOSIS — J209 Acute bronchitis, unspecified: Secondary | ICD-10-CM

## 2021-08-26 DIAGNOSIS — Z9049 Acquired absence of other specified parts of digestive tract: Secondary | ICD-10-CM | POA: Diagnosis not present

## 2021-08-26 DIAGNOSIS — A4189 Other specified sepsis: Secondary | ICD-10-CM | POA: Diagnosis present

## 2021-08-26 DIAGNOSIS — Z8249 Family history of ischemic heart disease and other diseases of the circulatory system: Secondary | ICD-10-CM | POA: Diagnosis not present

## 2021-08-26 LAB — RESPIRATORY PANEL BY PCR

## 2021-08-26 LAB — CBC
HCT: 39.4 % (ref 36.0–46.0)
Hemoglobin: 12.6 g/dL (ref 12.0–15.0)
MCH: 29.6 pg (ref 26.0–34.0)
MCHC: 32 g/dL (ref 30.0–36.0)
MCV: 92.5 fL (ref 80.0–100.0)
Platelets: 152 10*3/uL (ref 150–400)
RBC: 4.26 MIL/uL (ref 3.87–5.11)
RDW: 13.5 % (ref 11.5–15.5)
WBC: 5.4 10*3/uL (ref 4.0–10.5)
nRBC: 0 % (ref 0.0–0.2)

## 2021-08-26 LAB — HIV ANTIBODY (ROUTINE TESTING W REFLEX): HIV Screen 4th Generation wRfx: NONREACTIVE

## 2021-08-26 LAB — CREATININE, SERUM
Creatinine, Ser: 0.78 mg/dL (ref 0.44–1.00)
GFR, Estimated: >60 mL/min (ref >60)

## 2021-08-26 LAB — LACTIC ACID, PLASMA: Lactic Acid, Venous: 2.4 mmol/L (ref 0.5–1.9)

## 2021-08-26 MED ORDER — IPRATROPIUM-ALBUTEROL 0.5-2.5 (3) MG/3ML IN SOLN
3.0000 mL | Freq: Four times a day (QID) | RESPIRATORY_TRACT | Status: DC
  Administered 2021-08-26 – 2021-08-27 (×6): 3 mL via RESPIRATORY_TRACT
  Filled 2021-08-26 (×6): qty 3

## 2021-08-26 MED ORDER — PREDNISONE 20 MG PO TABS
40.0000 mg | ORAL_TABLET | Freq: Every day | ORAL | Status: DC
  Administered 2021-08-27: 40 mg via ORAL
  Filled 2021-08-26: qty 2

## 2021-08-26 MED ORDER — ALBUTEROL SULFATE (2.5 MG/3ML) 0.083% IN NEBU: 2.5000 mg | INHALATION_SOLUTION | RESPIRATORY_TRACT | Status: DC | PRN

## 2021-08-26 MED ORDER — ONDANSETRON HCL 4 MG PO TABS: 4.0000 mg | ORAL_TABLET | Freq: Four times a day (QID) | ORAL | Status: DC | PRN

## 2021-08-26 MED ORDER — AMLODIPINE BESYLATE 5 MG PO TABS
5.0000 mg | ORAL_TABLET | Freq: Every day | ORAL | Status: DC
  Administered 2021-08-26 – 2021-08-27 (×2): 5 mg via ORAL
  Filled 2021-08-26 (×2): qty 1

## 2021-08-26 MED ORDER — OSELTAMIVIR PHOSPHATE 75 MG PO CAPS
75.0000 mg | ORAL_CAPSULE | Freq: Two times a day (BID) | ORAL | Status: DC
  Administered 2021-08-26 – 2021-08-27 (×3): 75 mg via ORAL
  Filled 2021-08-26 (×3): qty 1

## 2021-08-26 MED ORDER — METHYLPREDNISOLONE SODIUM SUCC 40 MG IJ SOLR
40.0000 mg | Freq: Two times a day (BID) | INTRAMUSCULAR | Status: AC
  Administered 2021-08-26 (×2): 40 mg via INTRAVENOUS
  Filled 2021-08-26 (×2): qty 1

## 2021-08-26 MED ORDER — GUAIFENESIN ER 600 MG PO TB12
600.0000 mg | ORAL_TABLET | Freq: Two times a day (BID) | ORAL | Status: DC
  Administered 2021-08-26 – 2021-08-27 (×4): 600 mg via ORAL
  Filled 2021-08-26 (×4): qty 1

## 2021-08-26 MED ORDER — ENOXAPARIN SODIUM 60 MG/0.6ML IJ SOSY
0.5000 mg/kg | PREFILLED_SYRINGE | INTRAMUSCULAR | Status: DC
  Administered 2021-08-26 – 2021-08-27 (×2): 50 mg via SUBCUTANEOUS
  Filled 2021-08-26 (×2): qty 0.6

## 2021-08-26 MED ORDER — ACETAMINOPHEN 325 MG PO TABS: 650.0000 mg | ORAL_TABLET | Freq: Four times a day (QID) | ORAL | Status: DC | PRN

## 2021-08-26 MED ORDER — LACTATED RINGERS IV SOLN: INTRAVENOUS | Status: AC

## 2021-08-26 MED ORDER — ONDANSETRON HCL 4 MG/2ML IJ SOLN: 4.0000 mg | Freq: Four times a day (QID) | INTRAMUSCULAR | Status: DC | PRN

## 2021-08-26 MED ORDER — ACETAMINOPHEN 650 MG RE SUPP: 650.0000 mg | Freq: Four times a day (QID) | RECTAL | Status: DC | PRN

## 2021-08-26 NOTE — Plan of Care (Signed)

## 2021-08-26 NOTE — Plan of Care (Signed)
  Problem: Education: Goal: Knowledge of disease or condition will improve Outcome: Progressing   Problem: Activity: Goal: Ability to tolerate increased activity will improve Outcome: Progressing   Problem: Respiratory: Goal: Ability to maintain a clear airway will improve Outcome: Progressing   Problem: Education: Goal: Knowledge of General Education information will improve Description: Including pain rating scale, medication(s)/side effects and non-pharmacologic comfort measures Outcome: Progressing   Problem: Activity: Goal: Risk for activity intolerance will decrease Outcome: Progressing   Problem: Pain Managment: Goal: General experience of comfort will improve Outcome: Progressing   Problem: Safety: Goal: Ability to remain free from injury will improve Outcome: Progressing

## 2021-08-26 NOTE — Progress Notes (Signed)
Interval events noted.  Patient was seen and examined at the bedside.  She complains of cough wheezing.  Breathing is a little better.  She tested positive for influenza A infection.  She has been started on Tamiflu.  Continue bronchodilators and steroids.  Plan to discharge home tomorrow if she improves.  Plan of care was discussed with patient and her husband at the bedside.

## 2021-08-26 NOTE — Assessment & Plan Note (Signed)
Possible sepsis Patient did have fever, tachypnea and lactic acidosis Received IV fluid boluses in the ED Low suspicion for sepsis at this time but will continue to monitor Given negative procalcitonin will hold off on antibiotics Respiratory viral panel ordered

## 2021-08-26 NOTE — H&P (Signed)
History and Physical    Patient: Kelly Wallace HGD:[id number] DOB: 02/12/1950 DOA: 08/25/2021 DOS: the patient was seen and examined on 08/26/2021 PCP: Einar Pheasant, MD  Patient coming from: Home  Chief Complaint:  Chief Complaint  Patient presents with   Shortness of Breath   Cough    HPI: Kelly Wallace is a 71 y.o. female with medical history significant for HTN, arthritis who presents to the ED with a 1 day history of  shortness of breath, cough and wheezing associated with chest tightness.  She denies fever or chills.  Has no affected contacts.  Has no abdominal pain, nausea vomiting, dysuria or diarrhea.  Patient states that she has been intermittently wheezing for several weeks and her doctor had planned to send her for testing prior to starting her on inhalers, but over the course of the past few days her symptoms became acutely worse prompting the visit to the ED. ED course and data review: Febrile to 100.8, pulse 95 respirations 22-24 with BP 181/92 O2 sat 95% on room air. Labs: WBC normal with lactic acid 2.4 and procalcitonin less than 0.10.  BNP 86.7.  CMP and CBC mostly unremarkable EKG, personally viewed and interpreted showing sinus at 96 with no acute ST-T wave changes Chest x-ray nonacute Patient was treated with IV fluid boluses, DuoNebs prednisone but continued to have wheezing and increased work of breathing.  Hospitalist consulted for admission.     Past Medical History:  Diagnosis Date   Anemia    not currently being treated for this   Arthritis    History of colon polyps    Hypertension    Past Surgical History:  Procedure Laterality Date   CHOLECYSTECTOMY  1989   COLONOSCOPY WITH PROPOFOL N/A 04/10/2017   Procedure: COLONOSCOPY WITH PROPOFOL;  Surgeon: Lin Landsman, MD;  Location: [facility] ENDOSCOPY;  Service: Gastroenterology;  Laterality: N/A;   ESOPHAGOGASTRODUODENOSCOPY (EGD) WITH PROPOFOL N/A 04/10/2017   Procedure: ESOPHAGOGASTRODUODENOSCOPY (EGD)  WITH PROPOFOL;  Surgeon: Lin Landsman, MD;  Location: [facility] ENDOSCOPY;  Service: Gastroenterology;  Laterality: N/A;   GASTRIC BYPASS  03/21/03   JOINT REPLACEMENT Right 04/2014   TOTAL KNEE REPLACEMENT, DR. Rudene Christians, [place]   TOTAL KNEE ARTHROPLASTY Left 02/27/2016   Procedure: TOTAL KNEE ARTHROPLASTY;  Surgeon: Hessie Knows, MD;  Location: [facility] ORS;  Service: Orthopedics;  Laterality: Left;   TUBAL LIGATION  1976   Social History:  reports that she has never smoked. She has never used smokeless tobacco. She reports that she does not drink alcohol and does not use drugs.  No Known Allergies  Family History  Problem Relation Age of Onset   Hypertension Mother    Diabetes Mother    Cancer Maternal Aunt        question of type   Cancer Maternal Grandmother        colon   Breast cancer Cousin 69       maternal side    Prior to Admission medications   Medication Sig Start Date End Date Taking? Authorizing Provider  amLODipine (NORVASC) 5 MG tablet Take 1 tablet (5 mg total) by mouth daily. 07/18/21   Einar Pheasant, MD  polyethylene glycol powder (GLYCOLAX/MIRALAX) 17 GM/SCOOP powder Take 17 g by mouth daily. Patient taking differently: Take 17 g by mouth daily. PRN 08/01/20   Kennyth Arnold, FNP    Physical Exam: Vitals:   08/25/21 2110 08/25/21 2200 08/25/21 2300 08/25/21 2328  BP: (!) 150/79  130/71  Pulse: 84  95   Resp: (!) 22  19   Temp:  (!) 100.5 F (38.1 C)  98.6 F (37 C)  TempSrc:  Oral  Oral  SpO2: 98%  94%   Weight:      Height:       Physical Exam Vitals and nursing note reviewed.  Constitutional:      General: She is not in acute distress. HENT:     Head: Normocephalic and atraumatic.  Cardiovascular:     Rate and Rhythm: Normal rate and regular rhythm.     Heart sounds: Normal heart sounds.  Pulmonary:     Effort: Pulmonary effort is normal. Tachypnea and prolonged expiration present.     Breath sounds: Normal breath sounds.     Comments: Mouth  breathing Abdominal:     Palpations: Abdomen is soft.     Tenderness: There is no abdominal tenderness.  Neurological:     Mental Status: Mental status is at baseline.     Labs on Admission: I have personally reviewed following labs and imaging studies  CBC: Recent Labs  Lab 08/25/21 1955  WBC 5.8  NEUTROABS 4.5  HGB 13.4  HCT 40.7  MCV 92.5  PLT 726   Basic Metabolic Panel: Recent Labs  Lab 08/25/21 1955  NA 141  K 3.8  CL 111  CO2 23  GLUCOSE 121*  BUN 11  CREATININE 0.79  CALCIUM 8.4*   GFR: Estimated Creatinine Clearance: 78.6 mL/min (by C-G formula based on SCr of 0.79 mg/dL). Liver Function Tests: Recent Labs  Lab 08/25/21 1955  AST 30  ALT 22  ALKPHOS 126  BILITOT 1.1  PROT 7.6  ALBUMIN 3.9   No results for input(s): "LIPASE", "AMYLASE" in the last 168 hours. No results for input(s): "AMMONIA" in the last 168 hours. Coagulation Profile: No results for input(s): "INR", "PROTIME" in the last 168 hours. Cardiac Enzymes: No results for input(s): "CKTOTAL", "CKMB", "CKMBINDEX", "TROPONINI" in the last 168 hours. BNP (last 3 results) No results for input(s): "PROBNP" in the last 8760 hours. HbA1C: No results for input(s): "HGBA1C" in the last 72 hours. CBG: No results for input(s): "GLUCAP" in the last 168 hours. Lipid Profile: No results for input(s): "CHOL", "HDL", "LDLCALC", "TRIG", "CHOLHDL", "LDLDIRECT" in the last 72 hours. Thyroid Function Tests: No results for input(s): "TSH", "T4TOTAL", "FREET4", "T3FREE", "THYROIDAB" in the last 72 hours. Anemia Panel: No results for input(s): "VITAMINB12", "FOLATE", "FERRITIN", "TIBC", "IRON", "RETICCTPCT" in the last 72 hours. Urine analysis:    Component Value Date/Time   COLORURINE YELLOW (A) 02/21/2016 0915   APPEARANCEUR CLEAR (A) 02/21/2016 0915   APPEARANCEUR Clear 05/05/2014 1421   LABSPEC 1.017 02/21/2016 0915   LABSPEC 1.016 05/05/2014 1421   PHURINE 7.0 02/21/2016 0915   GLUCOSEU  NEGATIVE 02/21/2016 0915   GLUCOSEU NEGATIVE 10/25/2014 1216   HGBUR NEGATIVE 02/21/2016 0915   BILIRUBINUR NEGATIVE 02/21/2016 0915   BILIRUBINUR Negative 05/05/2014 1421   KETONESUR NEGATIVE 02/21/2016 0915   PROTEINUR NEGATIVE 02/21/2016 0915   UROBILINOGEN 0.2 10/25/2014 1216   NITRITE NEGATIVE 02/21/2016 0915   LEUKOCYTESUR NEGATIVE 02/21/2016 0915   LEUKOCYTESUR Trace 05/05/2014 1421    Radiological Exams on Admission: DG Chest 2 View  Result Date: 08/25/2021 CLINICAL DATA:  Cough, shortness of breath EXAM: CHEST - 2 VIEW COMPARISON:  03/24/2017 FINDINGS: The heart size and mediastinal contours are within normal limits. Both lungs are clear. The visualized skeletal structures are unremarkable. IMPRESSION: No active cardiopulmonary disease. Electronically Signed  By: Rolm Baptise M.D.   On: 08/25/2021 20:19     Data Reviewed: Relevant notes from primary care and specialist visits, past discharge summaries as available in EHR, including Care Everywhere. Prior diagnostic testing as pertinent to current admission diagnoses Updated medications and problem lists for reconciliation ED course, including vitals, labs, imaging, treatment and response to treatment Triage notes, nursing and pharmacy notes and ED provider's notes Notable results as noted in HPI   Assessment and Plan: * Acute bronchitis Likely viral given normal procalcitonin Schedule and as needed bronchodilators Solu-Medrol, antitussives Supplemental oxygen if needed  Lactic acidosis Possible sepsis Patient did have fever, tachypnea and lactic acidosis Received IV fluid boluses in the ED Low suspicion for sepsis at this time but will continue to monitor Given negative procalcitonin will hold off on antibiotics Respiratory viral panel ordered   Chronic venous insufficiency No acute issues  Essential hypertension, benign Continue home amlodipine.  BP controlled        DVT prophylaxis:  Lovenox  Consults: none  Advance Care Planning:   Code Status: Prior   Family Communication: none  Disposition Plan: Back to previous home environment  Severity of Illness: The appropriate patient status for this patient is OBSERVATION. Observation status is judged to be reasonable and necessary in order to provide the required intensity of service to ensure the patient's safety. The patient's presenting symptoms, physical exam findings, and initial radiographic and laboratory data in the context of their medical condition is felt to place them at decreased risk for further clinical deterioration. Furthermore, it is anticipated that the patient will be medically stable for discharge from the hospital within 2 midnights of admission.   Author: Athena Masse, MD 08/26/2021 12:22 AM  For on call review [email or web address].

## 2021-08-26 NOTE — Assessment & Plan Note (Signed)
Continue home amlodipine.  BP controlled

## 2021-08-26 NOTE — Progress Notes (Signed)
Anticoagulation monitoring(Lovenox):  71 yo female ordered Lovenox 40 mg Q24h    Filed Weights   08/25/21 1951  Weight: 102.1 kg (225 lb)   BMI 37.44    Lab Results  Component Value Date   CREATININE 0.79 08/25/2021   CREATININE 0.67 07/18/2021   CREATININE 0.65 06/22/2019   Estimated Creatinine Clearance: 78.6 mL/min (by C-G formula based on SCr of 0.79 mg/dL). Hemoglobin & Hematocrit     Component Value Date/Time   HGB 13.4 08/25/2021 1955   HGB 10.6 (L) 05/07/2014 0350   HCT 40.7 08/25/2021 1955   HCT 35.9 04/20/2014 0837     Per Protocol for Patient with estCrcl > 30 ml/min and BMI > 30, will transition to Lovenox 50 mg Q24h.

## 2021-08-26 NOTE — Assessment & Plan Note (Signed)
Likely viral given normal procalcitonin Schedule and as needed bronchodilators Solu-Medrol, antitussives Supplemental oxygen if needed

## 2021-08-26 NOTE — Assessment & Plan Note (Signed)
No acute issues.

## 2021-08-27 ENCOUNTER — Other Ambulatory Visit: Payer: Self-pay

## 2021-08-27 DIAGNOSIS — A419 Sepsis, unspecified organism: Secondary | ICD-10-CM | POA: Diagnosis not present

## 2021-08-27 DIAGNOSIS — J101 Influenza due to other identified influenza virus with other respiratory manifestations: Secondary | ICD-10-CM | POA: Diagnosis not present

## 2021-08-27 DIAGNOSIS — E872 Acidosis, unspecified: Secondary | ICD-10-CM | POA: Diagnosis not present

## 2021-08-27 DIAGNOSIS — J208 Acute bronchitis due to other specified organisms: Secondary | ICD-10-CM | POA: Diagnosis not present

## 2021-08-27 MED ORDER — POLYETHYLENE GLYCOL 3350 17 GM/SCOOP PO POWD: 17.0000 g | Freq: Every day | ORAL | Status: AC

## 2021-08-27 MED ORDER — OSELTAMIVIR PHOSPHATE 75 MG PO CAPS
75.0000 mg | ORAL_CAPSULE | Freq: Two times a day (BID) | ORAL | 0 refills | Status: AC
  Filled 2021-08-27: qty 7, 4d supply, fill #0

## 2021-08-27 NOTE — Plan of Care (Signed)

## 2021-08-27 NOTE — TOC Initial Note (Signed)
Transition of Care [facility]) - Initial/Assessment Note    Patient Details  Name: Kelly Wallace MRN: [id number] Date of Birth: 10-Mar-1950  [facility] of [facility]) CM/SW Contact:    Conception Oms, RN Phone Number: 08/27/2021, 9:42 AM  Clinical Narrative:                  Transition of Care [facility]) Screening Note   Patient Details  Name: Kelly Wallace Date of Birth: Oct 19, 1950   Transition of Care [facility]) CM/SW Contact:    Conception Oms, RN Phone Number: 08/27/2021, 9:42 AM    Transition of Care Department [facility]) has reviewed patient and no TOC needs have been identified at this time. We will continue to monitor patient advancement through interdisciplinary progression rounds. If new patient transition needs arise, please place a TOC consult.          Patient Goals and CMS Choice        Expected Discharge Plan and Services           Expected Discharge Date: 08/27/21                                    Prior Living Arrangements/Services                       Activities of Daily Living Home Assistive Devices/Equipment: Kasandra Knudsen (specify quad or straight) ADL Screening (condition at time of admission) Patient's cognitive ability adequate to safely complete daily activities?: Yes Is the patient deaf or have difficulty hearing?: No Does the patient have difficulty seeing, even when wearing glasses/contacts?: No Does the patient have difficulty concentrating, remembering, or making decisions?: No Patient able to express need for assistance with ADLs?: Yes Does the patient have difficulty dressing or bathing?: No Independently performs ADLs?: Yes (appropriate for developmental age) Does the patient have difficulty walking or climbing stairs?: No Weakness of Legs: None Weakness of Arms/Hands: None  Permission Sought/Granted                  Emotional Assessment              Admission diagnosis:  Acute bronchitis [J20.9] Wheezing  [R06.2] Bronchitis [J40] Sepsis, due to unspecified organism, unspecified whether acute organ dysfunction present ([place]) [A41.9] Influenza A [J10.1] Patient Active Problem List   Diagnosis Date Noted   Lactic acidosis 08/26/2021   Influenza A 08/26/2021   Severe sepsis ([place]) 08/26/2021   Acute bronchitis 08/25/2021   SOB (shortness of breath) on exertion 07/18/2021   B12 deficiency 08/02/2019   Thrombocytopenia ([place]) 02/21/2019   COVID-19 virus infection 02/21/2019   Chronic venous insufficiency 09/24/2017   Leg pain, bilateral 09/22/2017   Vitamin D deficiency 09/22/2017   Pain in limb 08/01/2017   Bilateral leg numbness 02/26/2017   Primary localized osteoarthritis of left knee 02/27/2016   Left knee pain 04/03/2015   Health care maintenance 11/04/2014   Knee pain, bilateral 02/27/2014   Iron deficiency 03/01/2013   Nipple discharge 12/26/2012   Essential hypertension, benign 04/07/2012   Elevated alkaline phosphatase level 04/07/2012   Anemia 04/07/2012   Personal history of colonic polyps 04/07/2012   PCP:  Einar Pheasant, MD Pharmacy:   [facility] [place] [address] Phone: [phone] Fax: [phone]     Social Determinants of Health (SDOH) Interventions    Readmission Risk Interventions  No data to display

## 2021-08-27 NOTE — Progress Notes (Signed)
Reviewed discharge instructions with pt. Pt verbalized understanding. Iv intact upon removal. Pt discharged with all personal belongings. Staff wheeled pt out. Pt transported to home via family vehicle.

## 2021-08-27 NOTE — Discharge Summary (Signed)
Physician Discharge Summary   Patient: Kelly Wallace MRN: [id number] DOB: 11-25-1950  Admit date:     08/25/2021  Discharge date: 08/27/21  Discharge Physician: Jennye Boroughs   PCP: Einar Pheasant, MD   Recommendations at discharge:   Follow-up with PCP in 1 to 2 weeks  Discharge Diagnoses: Principal Problem:   Severe sepsis ([place]) Active Problems:   Acute bronchitis   Lactic acidosis   Essential hypertension, benign   Chronic venous insufficiency   Influenza A  Resolved Problems:   * No resolved hospital problems. [facility] Course:  Kelly Wallace is a 71 year old woman with medical history significant for hypertension, osteoarthritis, chronic venous insufficiency, who presented to the hospital with cough, wheezing, chest tightness and shortness of breath.  Se was febrile in the emergency room with temperature of 100.8 F.  She tested positive for influenza A.  She was admitted to the hospital for acute bronchitis and severe sepsis secondary to influenza A infection.  She was treated with IV fluids, steroids, bronchodilators and Tamiflu.  Her condition has improved and she is deemed stable for discharge to home today.     Consultants: None Procedures performed: None Disposition: Home Diet recommendation:  Discharge Diet Orders (From admission, onward)     Start     Ordered   08/27/21 0000  Diet - low sodium heart healthy        08/27/21 0912           Cardiac diet DISCHARGE MEDICATION: Allergies as of 08/27/2021   No Known Allergies      Medication List     TAKE these medications    amLODipine 5 MG tablet Commonly known as: NORVASC Take 1 tablet (5 mg total) by mouth daily.   oseltamivir 75 MG capsule Commonly known as: TAMIFLU Take 1 capsule (75 mg total) by mouth 2 (two) times daily for 7 doses.   polyethylene glycol powder 17 GM/SCOOP powder Commonly known as: GLYCOLAX/MIRALAX Take 17 g by mouth daily. PRN        Discharge Exam: Danley Danker  Weights   08/25/21 1951  Weight: 102.1 kg   GEN: NAD SKIN: Warm and dry EYES: No pallor or icterus ENT: MMM CV: RRR PULM: CTA B ABD: soft, obese, NT, +BS CNS: AAO x 3, non focal EXT: No edema or tenderness   Condition at discharge: good  The results of significant diagnostics from this hospitalization (including imaging, microbiology, ancillary and laboratory) are listed below for reference.   Imaging Studies: DG Chest 2 View  Result Date: 08/25/2021 CLINICAL DATA:  Cough, shortness of breath EXAM: CHEST - 2 VIEW COMPARISON:  03/24/2017 FINDINGS: The heart size and mediastinal contours are within normal limits. Both lungs are clear. The visualized skeletal structures are unremarkable. IMPRESSION: No active cardiopulmonary disease. Electronically Signed   By: Rolm Baptise M.D.   On: 08/25/2021 20:19    Microbiology: Results for orders placed or performed during the hospital encounter of 08/25/21  SARS Coronavirus 2 by RT PCR (hospital order, performed in [facility] hospital lab) *cepheid single result test* Anterior Nasal Swab     Status: None   Collection Time: 08/25/21  7:55 PM   Specimen: Anterior Nasal Swab  Result Value Ref Range Status   SARS Coronavirus 2 by RT PCR NEGATIVE NEGATIVE Final    Comment: (NOTE) SARS-CoV-2 target nucleic acids are NOT DETECTED.  The SARS-CoV-2 RNA is generally detectable in upper and lower respiratory specimens during the acute phase of  infection. The lowest concentration of SARS-CoV-2 viral copies this assay can detect is 250 copies / mL. A negative result does not preclude SARS-CoV-2 infection and should not be used as the sole basis for treatment or other patient management decisions.  A negative result may occur with improper specimen collection / handling, submission of specimen other than nasopharyngeal swab, presence of viral mutation(s) within the areas targeted by this assay, and inadequate number of viral copies (<250 copies /  mL). A negative result must be combined with clinical observations, patient history, and epidemiological information.  Fact Sheet for Patients:   https://www.patel.info/  Fact Sheet for Healthcare Providers: https://hall.com/  This test is not yet approved or  cleared by the [place] FDA and has been authorized for detection and/or diagnosis of SARS-CoV-2 by FDA under an Emergency Use Authorization (EUA).  This EUA will remain in effect (meaning this test can be used) for the duration of the COVID-19 declaration under Section 564(b)(1) of the Act, 21 U.S.C. section 360bbb-3(b)(1), unless the authorization is terminated or revoked sooner.  Performed at [facility], [address] [place], [place] [address]   Blood culture (routine x 2)     Status: None (Preliminary result)   Collection Time: 08/25/21  9:57 PM   Specimen: BLOOD  Result Value Ref Range Status   Specimen Description BLOOD LEFT ANTECUBITAL  Final   Special Requests   Final    BOTTLES DRAWN AEROBIC AND ANAEROBIC Blood Culture results may not be optimal due to an inadequate volume of blood received in culture bottles   Culture   Final    NO GROWTH 2 DAYS Performed at [facility], [address] [place], [place] [address]    Report Status PENDING  Incomplete  Blood culture (routine x 2)     Status: None (Preliminary result)   Collection Time: 08/25/21  9:57 PM   Specimen: BLOOD  Result Value Ref Range Status   Specimen Description BLOOD BLOOD LEFT FOREARM  Final   Special Requests   Final    BOTTLES DRAWN AEROBIC AND ANAEROBIC Blood Culture results may not be optimal due to an inadequate volume of blood received in culture bottles   Culture   Final    NO GROWTH 2 DAYS Performed at [facility], [address] [place], [place] [address]    Report Status PENDING  Incomplete  Respiratory (~20 pathogens) panel by PCR     Status: Abnormal    Collection Time: 08/26/21  2:14 AM   Specimen: Nasopharyngeal Swab; Respiratory  Result Value Ref Range Status   Adenovirus NOT DETECTED NOT DETECTED Final   Coronavirus 229E NOT DETECTED NOT DETECTED Final    Comment: (NOTE) The Coronavirus on the Respiratory Panel, DOES NOT test for the novel  Coronavirus (2019 nCoV)    Coronavirus HKU1 NOT DETECTED NOT DETECTED Final   Coronavirus NL63 NOT DETECTED NOT DETECTED Final   Coronavirus OC43 NOT DETECTED NOT DETECTED Final   Metapneumovirus NOT DETECTED NOT DETECTED Final   Rhinovirus / Enterovirus NOT DETECTED NOT DETECTED Final   Influenza A H1 2009 DETECTED (A) NOT DETECTED Final   Influenza B NOT DETECTED NOT DETECTED Final   Parainfluenza Virus 1 NOT DETECTED NOT DETECTED Final   Parainfluenza Virus 2 NOT DETECTED NOT DETECTED Final   Parainfluenza Virus 3 NOT DETECTED NOT DETECTED Final   Parainfluenza Virus 4 NOT DETECTED NOT DETECTED Final   Respiratory Syncytial Virus NOT DETECTED NOT DETECTED Final  Bordetella pertussis NOT DETECTED NOT DETECTED Final   Bordetella Parapertussis NOT DETECTED NOT DETECTED Final   Chlamydophila pneumoniae NOT DETECTED NOT DETECTED Final   Mycoplasma pneumoniae NOT DETECTED NOT DETECTED Final    Comment: Performed at [facility], [place] [address] [place], [place] [address]    Labs: CBC: Recent Labs  Lab 08/25/21 1955 08/26/21 0113  WBC 5.8 5.4  NEUTROABS 4.5  --   HGB 13.4 12.6  HCT 40.7 39.4  MCV 92.5 92.5  PLT 166 030   Basic Metabolic Panel: Recent Labs  Lab 08/25/21 1955 08/26/21 0113  NA 141  --   K 3.8  --   CL 111  --   CO2 23  --   GLUCOSE 121*  --   BUN 11  --   CREATININE 0.79 0.78  CALCIUM 8.4*  --    Liver Function Tests: Recent Labs  Lab 08/25/21 1955  AST 30  ALT 22  ALKPHOS 126  BILITOT 1.1  PROT 7.6  ALBUMIN 3.9   CBG: No results for input(s): "GLUCAP" in the last 168 hours.  Discharge time spent: greater than 30  minutes.  Signed: Jennye Boroughs, MD [facility] 08/27/2021

## 2021-08-27 NOTE — Plan of Care (Signed)
Problem: Education: Goal: Knowledge of disease or condition will improve 08/27/2021 1007 by Antinio Sanderfer Bet, LPN Outcome: Adequate for Discharge 08/27/2021 1007 by Dejan Angert Bet, LPN Outcome: Progressing Goal: Knowledge of the prescribed therapeutic regimen will improve 08/27/2021 1007 by Lanier Millon Bet, LPN Outcome: Adequate for Discharge 08/27/2021 1007 by Malissia Rabbani Bet, LPN Outcome: Progressing Goal: Individualized Educational Video(s) 08/27/2021 1007 by Jamarkis Branam Bet, LPN Outcome: Adequate for Discharge 08/27/2021 1007 by Jaan Fischel Bet, LPN Outcome: Progressing   Problem: Activity: Goal: Ability to tolerate increased activity will improve 08/27/2021 1007 by Roczen Waymire Bet, LPN Outcome: Adequate for Discharge 08/27/2021 1007 by Pihu Basil Bet, LPN Outcome: Progressing Goal: Will verbalize the importance of balancing activity with adequate rest periods 08/27/2021 1007 by Brion Sossamon Bet, LPN Outcome: Adequate for Discharge 08/27/2021 1007 by Shakil Dirk Bet, LPN Outcome: Progressing   Problem: Respiratory: Goal: Ability to maintain a clear airway will improve 08/27/2021 1007 by Laurinda Carreno Bet, LPN Outcome: Adequate for Discharge 08/27/2021 1007 by Brittany Amirault Bet, LPN Outcome: Progressing Goal: Levels of oxygenation will improve 08/27/2021 1007 by Faizan Geraci Bet, LPN Outcome: Adequate for Discharge 08/27/2021 1007 by Marysa Wessner Bet, LPN Outcome: Progressing Goal: Ability to maintain adequate ventilation will improve 08/27/2021 1007 by Shuntell Foody Bet, LPN Outcome: Adequate for Discharge 08/27/2021 1007 by Enrique Weiss Bet, LPN Outcome: Progressing   Problem: Education: Goal: Knowledge of General Education information will improve Description: Including pain rating scale, medication(s)/side effects and non-pharmacologic comfort measures 08/27/2021 1007 by Amazing Cowman Bet, LPN Outcome: Adequate for Discharge 08/27/2021 1007 by Dheeraj Hail Bet, LPN Outcome: Progressing   Problem: Health  Behavior/Discharge Planning: Goal: Ability to manage health-related needs will improve 08/27/2021 1007 by Jakevion Arney Bet, LPN Outcome: Adequate for Discharge 08/27/2021 1007 by Aaylah Pokorny, Shakaya Bhullar Bet, LPN Outcome: Progressing   Problem: Clinical Measurements: Goal: Ability to maintain clinical measurements within normal limits will improve 08/27/2021 1007 by Darshawn Boateng Bet, LPN Outcome: Adequate for Discharge 08/27/2021 1007 by Parrish Bonn Bet, LPN Outcome: Progressing Goal: Will remain free from infection 08/27/2021 1007 by Zlatan Hornback Bet, LPN Outcome: Adequate for Discharge 08/27/2021 1007 by Nickson Middlesworth Bet, LPN Outcome: Progressing Goal: Diagnostic test results will improve 08/27/2021 1007 by Lexa Coronado Bet, LPN Outcome: Adequate for Discharge 08/27/2021 1007 by Saige Busby Bet, LPN Outcome: Progressing Goal: Respiratory complications will improve 08/27/2021 1007 by Canna Nickelson Bet, LPN Outcome: Adequate for Discharge 08/27/2021 1007 by Dartanyon Frankowski Bet, LPN Outcome: Progressing Goal: Cardiovascular complication will be avoided 08/27/2021 1007 by Deva Ron Bet, LPN Outcome: Adequate for Discharge 08/27/2021 1007 by Tamaiya Bump Bet, LPN Outcome: Progressing   Problem: Activity: Goal: Risk for activity intolerance will decrease 08/27/2021 1007 by Maximum Reiland Bet, LPN Outcome: Adequate for Discharge 08/27/2021 1007 by Chyane Greer Bet, LPN Outcome: Progressing   Problem: Nutrition: Goal: Adequate nutrition will be maintained 08/27/2021 1007 by Lelan Cush Bet, LPN Outcome: Adequate for Discharge 08/27/2021 1007 by Takoya Jonas Bet, LPN Outcome: Progressing   Problem: Coping: Goal: Level of anxiety will decrease 08/27/2021 1007 by Dontavion Noxon Bet, LPN Outcome: Adequate for Discharge 08/27/2021 1007 by Emmalyne Giacomo Bet, LPN Outcome: Progressing   Problem: Elimination: Goal: Will not experience complications related to bowel motility 08/27/2021 1007 by Tru Rana Bet, LPN Outcome: Adequate for Discharge 08/27/2021 1007 by  Tyrez Berrios Bet, LPN Outcome: Progressing Goal: Will not experience complications related to urinary retention 08/27/2021 1007 by Topacio Cella Bet, LPN Outcome: Adequate for Discharge 08/27/2021 1007 by Arcenio Mullaly,  Audiel Scheiber Bet, LPN Outcome: Progressing   Problem: Pain Managment: Goal: General experience of comfort will improve 08/27/2021 1007 by Torie Towle Bet, LPN Outcome: Adequate for Discharge 08/27/2021 1007 by Yumiko Alkins Bet, LPN Outcome: Progressing   Problem: Safety: Goal: Ability to remain free from injury will improve 08/27/2021 1007 by Betheny Suchecki Bet, LPN Outcome: Adequate for Discharge 08/27/2021 1007 by Mckynleigh Mussell Bet, LPN Outcome: Progressing   Problem: Skin Integrity: Goal: Risk for impaired skin integrity will decrease 08/27/2021 1007 by Bodie Abernethy Bet, LPN Outcome: Adequate for Discharge 08/27/2021 1007 by Tyeasha Ebbs Bet, LPN Outcome: Progressing

## 2021-08-28 ENCOUNTER — Telehealth: Payer: Self-pay

## 2021-08-28 NOTE — Telephone Encounter (Signed)
Returned patient call, no answer. Please schedule hospital follow up with PCP.

## 2021-08-28 NOTE — Telephone Encounter (Signed)
Transition Care Management Unsuccessful Follow-up Telephone Call  Date of discharge and from where:  08/27/21 [facility]  Attempts:  1st Attempt  Reason for unsuccessful TCM follow-up call:  No answer/busy. Will follow.

## 2021-08-28 NOTE — Telephone Encounter (Signed)
Patient returned our call.  Please call.

## 2021-08-29 ENCOUNTER — Inpatient Hospital Stay: Payer: Medicare Other | Attending: Oncology

## 2021-08-29 DIAGNOSIS — E538 Deficiency of other specified B group vitamins: Secondary | ICD-10-CM | POA: Diagnosis not present

## 2021-08-29 DIAGNOSIS — E611 Iron deficiency: Secondary | ICD-10-CM

## 2021-08-29 MED ORDER — CYANOCOBALAMIN 1000 MCG/ML IJ SOLN
1000.0000 ug | Freq: Once | INTRAMUSCULAR | Status: AC
  Administered 2021-08-29: 1000 ug via INTRAMUSCULAR
  Filled 2021-08-29: qty 1

## 2021-08-29 NOTE — Telephone Encounter (Signed)
Transition Care Management Unsuccessful Follow-up Telephone Call  Date of discharge and from where:  08/27/21 [facility]  Attempts:  2nd Attempt  Reason for unsuccessful TCM follow-up call:  Unable to leave message. Will follow.

## 2021-08-30 LAB — CULTURE, BLOOD (ROUTINE X 2)
Culture: NO GROWTH
Culture: NO GROWTH

## 2021-08-30 NOTE — Telephone Encounter (Signed)
Transition Care Management Unsuccessful Follow-up Telephone Call  Date of discharge and from where:  08/27/21 [facility]  Attempts:  3rd Attempt  Reason for unsuccessful TCM follow-up call:  Unable to leave message. No answer, voicemail full. No further TCM attempts made at this time.

## 2021-09-06 ENCOUNTER — Other Ambulatory Visit: Payer: Self-pay

## 2021-09-06 NOTE — Patient Outreach (Signed)
  Care Coordination   09/06/2021 Name: KYLIE SIMMONDS MRN: [id number] DOB: [birth year]   Care Coordination Outreach Attempts:  Contact was made with the patient today to offer care coordination services as a benefit of their health plan. The patient requested a return call on a later date.   Follow Up Plan:  Additional outreach attempts will be made to offer the patient care coordination information and services.   Encounter Outcome:  Pt. Request to Call Back  Care Coordination Interventions Activated:  No   Care Coordination Interventions:  No, not indicated    Quinn Plowman RN,BSN,CCM RN Care Manager Coordinator [phone]

## 2021-09-06 NOTE — Telephone Encounter (Signed)
This encounter was created in error - please disregard.

## 2021-09-12 ENCOUNTER — Ambulatory Visit: Payer: Self-pay

## 2021-09-12 NOTE — Patient Outreach (Signed)
  Care Coordination   09/12/2021 Name: Kelly Wallace MRN: [id number] DOB: 1950/09/27   Care Coordination Outreach Attempts:  A second unsuccessful outreach was attempted today to offer the patient with information about available care coordination services as a benefit of their health plan.   Unable to reach patient or leave voice message.  Phone only rang.  Attempted call x 2.   Follow Up Plan:  Additional outreach attempts will be made to offer the patient care coordination information and services.   Encounter Outcome:  No Answer  Care Coordination Interventions Activated:  No   Care Coordination Interventions:  No, not indicated    Quinn Plowman RN,BSN,CCM RN Care Manager Coordinator [phone]

## 2021-09-13 ENCOUNTER — Encounter: Payer: Self-pay | Admitting: Oncology

## 2021-09-14 ENCOUNTER — Ambulatory Visit (INDEPENDENT_AMBULATORY_CARE_PROVIDER_SITE_OTHER): Payer: Medicare Other | Admitting: Internal Medicine

## 2021-09-14 ENCOUNTER — Encounter: Payer: Self-pay | Admitting: Internal Medicine

## 2021-09-14 DIAGNOSIS — Z860100 Personal history of colon polyps, unspecified: Secondary | ICD-10-CM

## 2021-09-14 DIAGNOSIS — I10 Essential (primary) hypertension: Secondary | ICD-10-CM

## 2021-09-14 DIAGNOSIS — E611 Iron deficiency: Secondary | ICD-10-CM

## 2021-09-14 DIAGNOSIS — J101 Influenza due to other identified influenza virus with other respiratory manifestations: Secondary | ICD-10-CM | POA: Diagnosis not present

## 2021-09-14 DIAGNOSIS — H9192 Unspecified hearing loss, left ear: Secondary | ICD-10-CM | POA: Diagnosis not present

## 2021-09-14 NOTE — Progress Notes (Signed)
[name], female   DOB: 05-Aug-1950, 71 y.o.   MRN: [id number]   Subjective:    [name], female    DOB: 09-09-1950, 71 y.o.   MRN: [id number]   Patient here for  Chief Complaint  Patient presents with   Hospitalization Follow-up   .   HPI Admitted 08/25/21 - 08/27/21 - after presenting with cough, wheezing, chest tightness and sob.  Tested positive for influenza A.  Admitted with acute bronchitis and severe sepsis.  Treated with IVFs, steroids, bronchodilators and tamiflu.  Reports she is feeling better.  No persistent cough or congestion.  Breathing better.  Eating.  No vomiting.  No abdominal pain or bowel issues reported.  Reports decreased hearing - left ear - since out of the hospital.  No pain.  States blood pressures 130-140.  States taking amlodipine.  Uses cane for long walks.     Past Medical History:  Diagnosis Date   Anemia    not currently being treated for this   Arthritis    History of colon polyps    Hypertension    Past Surgical History:  Procedure Laterality Date   CHOLECYSTECTOMY  1989   COLONOSCOPY WITH PROPOFOL N/A 04/10/2017   Procedure: COLONOSCOPY WITH PROPOFOL;  Surgeon: Lin Landsman, MD;  Location: [facility] ENDOSCOPY;  Service: Gastroenterology;  Laterality: N/A;   ESOPHAGOGASTRODUODENOSCOPY (EGD) WITH PROPOFOL N/A 04/10/2017   Procedure: ESOPHAGOGASTRODUODENOSCOPY (EGD) WITH PROPOFOL;  Surgeon: Lin Landsman, MD;  Location: [facility] ENDOSCOPY;  Service: Gastroenterology;  Laterality: N/A;   GASTRIC BYPASS  03/21/03   JOINT REPLACEMENT Right 04/2014   TOTAL KNEE REPLACEMENT, DR. Rudene Christians, [place]   TOTAL KNEE ARTHROPLASTY Left 02/27/2016   Procedure: TOTAL KNEE ARTHROPLASTY;  Surgeon: Hessie Knows, MD;  Location: [facility] ORS;  Service: Orthopedics;  Laterality: Left;   TUBAL LIGATION  1976   Family History  Problem Relation Age of Onset   Hypertension Mother    Diabetes Mother    Heart disease Father    Cancer Maternal Aunt         question of type   Cancer Maternal Grandmother        colon   Breast cancer Cousin 21       maternal side   Social History   Socioeconomic History   Marital status: Married    Spouse name: Not on file   Number of children: 2   Years of education: Not on file   Highest education level: Not on file  Occupational History    Employer: [employer]  Tobacco Use   Smoking status: Never   Smokeless tobacco: Never  Vaping Use   Vaping Use: Never used  Substance and Sexual Activity   Alcohol use: No    Alcohol/week: 0.0 standard drinks of alcohol   Drug use: No   Sexual activity: Yes  Other Topics Concern   Not on file  Social History Narrative   Not on file   Social Determinants of Health   Financial Resource Strain: Low Risk  (01/09/2021)   Overall Financial Resource Strain (CARDIA)    Difficulty of Paying Living Expenses: Not hard at all  Food Insecurity: No Food Insecurity (08/21/2021)   Hunger Vital Sign    Worried About Running Out of Food in the Last Year: Never true    Ran Out of Food in the Last Year: Never true  Transportation Needs: No Transportation Needs (08/21/2021)   PRAPARE - Transportation    Lack  of Transportation (Medical): No    Lack of Transportation (Non-Medical): No  Physical Activity: Sufficiently Active (01/09/2021)   Exercise Vital Sign    Days of Exercise per Week: 5 days    Minutes of Exercise per Session: 30 min  Stress: No Stress Concern Present (01/09/2021)   [place]    Feeling of Stress : Not at all  Social Connections: Unknown (01/09/2021)   Social Connection and Isolation Panel [NHANES]    Frequency of Communication with Friends and Family: More than three times a week    Frequency of Social Gatherings with Friends and Family: More than three times a week    Attends Religious Services: Not on Advertising copywriter or Organizations: Not on file    Attends Theatre manager Meetings: Not on file    Marital Status: Not on file     Review of Systems  Constitutional:  Negative for appetite change and unexpected weight change.  HENT:  Negative for congestion and sinus pressure.        Hearing change - left ear as outlined.   Respiratory:  Negative for cough, chest tightness and shortness of breath.   Cardiovascular:  Negative for chest pain and palpitations.  Gastrointestinal:  Negative for abdominal pain, diarrhea, nausea and vomiting.  Genitourinary:  Negative for difficulty urinating and dysuria.  Musculoskeletal:  Negative for joint swelling and myalgias.  Skin:  Negative for color change and rash.  Neurological:  Negative for dizziness, light-headedness and headaches.  Psychiatric/Behavioral:  Negative for agitation and dysphoric mood.        Objective:     BP 132/84   Pulse 85   Temp 98.6 F (37 C) (Oral)   Ht '5\' 4"'$  (1.626 m)   Wt 232 lb 3.2 oz (105.3 kg)   LMP 04/06/2009   SpO2 98%   BMI 39.86 kg/m  Wt Readings from Last 3 Encounters:  09/18/21 236 lb 2 oz (107.1 kg)  09/14/21 232 lb 3.2 oz (105.3 kg)  08/25/21 225 lb (102.1 kg)    Physical Exam Vitals reviewed.  Constitutional:      General: She is not in acute distress.    Appearance: Normal appearance.  HENT:     Head: Normocephalic and atraumatic.     Right Ear: External ear normal.     Left Ear: External ear normal.     Ears:     Comments: No cerumen impaction.  TM - no erythema.   Eyes:     General: No scleral icterus.       Right eye: No discharge.        Left eye: No discharge.     Conjunctiva/sclera: Conjunctivae normal.  Neck:     Thyroid: No thyromegaly.  Cardiovascular:     Rate and Rhythm: Normal rate and regular rhythm.  Pulmonary:     Effort: No respiratory distress.     Breath sounds: Normal breath sounds. No wheezing.  Abdominal:     General: Bowel sounds are normal.     Palpations: Abdomen is soft.     Tenderness: There is no abdominal  tenderness.  Musculoskeletal:        General: No swelling or tenderness.     Cervical back: Neck supple. No tenderness.  Lymphadenopathy:     Cervical: No cervical adenopathy.  Skin:    Findings: No erythema or rash.  Neurological:     Mental Status:  She is alert.  Psychiatric:        Mood and Affect: Mood normal.        Behavior: Behavior normal.      Outpatient Encounter Medications as of 09/14/2021  Medication Sig   amLODipine (NORVASC) 5 MG tablet Take 1 tablet (5 mg total) by mouth daily.   polyethylene glycol powder (GLYCOLAX/MIRALAX) 17 GM/SCOOP powder Take 17 g by mouth daily. PRN   No facility-administered encounter medications on file as of 09/14/2021.     Lab Results  Component Value Date   WBC 5.4 08/26/2021   HGB 12.6 08/26/2021   HCT 39.4 08/26/2021   PLT 152 08/26/2021   GLUCOSE 121 (H) 08/25/2021   CHOL 97 07/18/2021   TRIG 64.0 07/18/2021   HDL 49.60 07/18/2021   LDLCALC 35 07/18/2021   ALT 22 08/25/2021   AST 30 08/25/2021   NA 141 08/25/2021   K 3.8 08/25/2021   CL 111 08/25/2021   CREATININE 0.78 08/26/2021   BUN 11 08/25/2021   CO2 23 08/25/2021   TSH 1.58 07/18/2021   INR 1.14 02/21/2016   HGBA1C 5.6 03/25/2019    DG Chest 2 View  Result Date: 08/25/2021 CLINICAL DATA:  Cough, shortness of breath EXAM: CHEST - 2 VIEW COMPARISON:  03/24/2017 FINDINGS: The heart size and mediastinal contours are within normal limits. Both lungs are clear. The visualized skeletal structures are unremarkable. IMPRESSION: No active cardiopulmonary disease. Electronically Signed   By: Rolm Baptise M.D.   On: 08/25/2021 20:19       Assessment & Plan:   Problem List Items Addressed This Visit     Essential hypertension, benign    On amlodipine.  Blood pressure as outlined.  Hold on changing medication.  Follow metabolic panel.  Follow pressures.        Relevant Orders   Basic metabolic panel   Hepatic function panel   Lipid panel   Hearing loss     Discussed.  Consider ENT referral.       Influenza A    Hospitalized recently as outlined.  Treated with IVFs, bronchodilators and steroids.  Doing better.  Breathing better.        Iron deficiency    Has seen hematology.  Follow cbc.       Personal history of colonic polyps    Colonoscopy 04/10/17 - Non-thrombosed external hemorrhoids found on perianal exam. The examined portion of the ileum was normal. One 5 mm polyp in the transverse colon, removed with a cold snare. Resected and retrieved. Non-bleeding external hemorrhoids. Recommended f/u in 5 years (Dr Marius Ditch).         Einar Pheasant, MD

## 2021-09-18 ENCOUNTER — Encounter: Payer: Self-pay | Admitting: Cardiovascular Disease

## 2021-09-18 ENCOUNTER — Ambulatory Visit: Payer: Medicare Other | Attending: Cardiovascular Disease | Admitting: Cardiovascular Disease

## 2021-09-18 VITALS — BP 122/80 | HR 78 | Ht 64.0 in | Wt 236.1 lb

## 2021-09-18 DIAGNOSIS — I10 Essential (primary) hypertension: Secondary | ICD-10-CM

## 2021-09-18 DIAGNOSIS — R0602 Shortness of breath: Secondary | ICD-10-CM | POA: Diagnosis not present

## 2021-09-18 DIAGNOSIS — M7989 Other specified soft tissue disorders: Secondary | ICD-10-CM | POA: Diagnosis not present

## 2021-09-18 NOTE — Patient Instructions (Addendum)
Medication Instructions:  No changes  If you need a refill on your cardiac medications before your next appointment, please call your pharmacy.   Lab work: No new labs needed  Testing/Procedures: Echocardiogram for shortness of breath, leg swelling  Follow-Up: At [facility], you and your health needs are our priority.  As part of our continuing mission to provide you with exceptional heart care, we have created designated Provider Care Teams.  These Care Teams include your primary Cardiologist (physician) and Advanced Practice Providers (APPs -  Physician Assistants and Nurse Practitioners) who all work together to provide you with the care you need, when you need it.  You will need a follow up appointment as needed  Providers on your designated Care Team:   Murray Hodgkins, NP Christell Faith, PA-C Cadence Kathlen Mody, [place]  COVID-19 Vaccine Information can be found at: [email or web address].uk For questions related to vaccine distribution or appointments, please email [email or web address] or call [phone].

## 2021-09-18 NOTE — Progress Notes (Signed)
Cardiology Office Note  Date:  09/18/2021   ID:  Mandisa, Persinger September 12, 1950, MRN [id number]  PCP:  Einar Pheasant, MD   Chief Complaint  Patient presents with   New Patient (Initial Visit)    Ref by Einar Pheasant, MD for shortness of breath on exertion. Medications reviewed by the patient verbally.     HPI:  Ms. Kelly Wallace is a 71 year old woman with past medical history of HTN Morbid Obesity Referred by Dr. Nicki Reaper for consultation of her shortness of breath  Seen by Dr. Nicki Reaper July 18, 2021 On that visit was not taking blood pressure medication regularly, had increased shortness of breath on exertion Blood pressure 150/90  In the hospital early August 2023 chest tightness and shortness of breath, flu positive.  treated with IV fluids, steroids, bronchodilators and Tamiflu BNP normal  Non smoker Normal renal function  Gets sob with walking, reports this has been going on for many years, " likely my weight" Has a buggy she leans on when walking No chest pain on assertion Some ankle swelling  Lab work reviewed Total chol <100, not on a statin LDL 16  Mother with CHF  EKG personally reviewed by myself on todays visit Normal sinus rhythm rate 78 bpm no significant ST-T wave changes   PMH:   has a past medical history of Anemia, Arthritis, History of colon polyps, and Hypertension.  PSH:    Past Surgical History:  Procedure Laterality Date   CHOLECYSTECTOMY  1989   COLONOSCOPY WITH PROPOFOL N/A 04/10/2017   Procedure: COLONOSCOPY WITH PROPOFOL;  Surgeon: Lin Landsman, MD;  Location: [facility] ENDOSCOPY;  Service: Gastroenterology;  Laterality: N/A;   ESOPHAGOGASTRODUODENOSCOPY (EGD) WITH PROPOFOL N/A 04/10/2017   Procedure: ESOPHAGOGASTRODUODENOSCOPY (EGD) WITH PROPOFOL;  Surgeon: Lin Landsman, MD;  Location: [facility] ENDOSCOPY;  Service: Gastroenterology;  Laterality: N/A;   GASTRIC BYPASS  03/21/03   JOINT REPLACEMENT Right 04/2014   TOTAL KNEE  REPLACEMENT, DR. Rudene Christians, [place]   TOTAL KNEE ARTHROPLASTY Left 02/27/2016   Procedure: TOTAL KNEE ARTHROPLASTY;  Surgeon: Hessie Knows, MD;  Location: [facility] ORS;  Service: Orthopedics;  Laterality: Left;   TUBAL LIGATION  1976    Current Outpatient Medications  Medication Sig Dispense Refill   amLODipine (NORVASC) 5 MG tablet Take 1 tablet (5 mg total) by mouth daily. 90 tablet 1   polyethylene glycol powder (GLYCOLAX/MIRALAX) 17 GM/SCOOP powder Take 17 g by mouth daily. PRN     No current facility-administered medications for this visit.    Allergies:   Patient has no known allergies.   Social History:  The patient  reports that she has never smoked. She has never used smokeless tobacco. She reports that she does not drink alcohol and does not use drugs.   Family History:   family history includes Breast cancer (age of onset: 64) in her cousin; Cancer in her maternal aunt and maternal grandmother; Diabetes in her mother; Heart disease in her father; Hypertension in her mother.    Review of Systems: Review of Systems  Constitutional: Negative.   HENT: Negative.    Respiratory:  Positive for shortness of breath.   Cardiovascular: Negative.   Gastrointestinal: Negative.   Musculoskeletal: Negative.   Neurological: Negative.   Psychiatric/Behavioral: Negative.    All other systems reviewed and are negative.   PHYSICAL EXAM: VS:  BP 122/80 (BP Location: Right Arm, Patient Position: Sitting, Cuff Size: Large)   Pulse 78   Ht '5\' 4"'$  (1.626 m)  Wt 236 lb 2 oz (107.1 kg)   LMP 04/06/2009   SpO2 97%   BMI 40.53 kg/m  , BMI Body mass index is 40.53 kg/m. GEN: Well nourished, well developed, in no acute distress, obese  HEENT: normal Neck: no JVD, carotid bruits, or masses Cardiac: RRR; no murmurs, rubs, or gallops, trace minimally pitting lower extremity edema Respiratory:  clear to auscultation bilaterally, normal work of breathing GI: soft, nontender, nondistended, + BS MS: no  deformity or atrophy Skin: warm and dry, no rash Neuro:  Strength and sensation are intact Psych: euthymic mood, full affect   Recent Labs: 07/18/2021: TSH 1.58 08/25/2021: ALT 22; B Natriuretic Peptide 86.7; BUN 11; Potassium 3.8; Sodium 141 08/26/2021: Creatinine, Ser 0.78; Hemoglobin 12.6; Platelets 152    Lipid Panel Lab Results  Component Value Date   CHOL 97 07/18/2021   HDL 49.60 07/18/2021   LDLCALC 35 07/18/2021   TRIG 64.0 07/18/2021      Wt Readings from Last 3 Encounters:  09/18/21 236 lb 2 oz (107.1 kg)  09/14/21 232 lb 3.2 oz (105.3 kg)  08/25/21 225 lb (102.1 kg)     ASSESSMENT AND PLAN:  Problem List Items Addressed This Visit       Cardiology Problems   Essential hypertension, benign   Relevant Orders   EKG 12-Lead   Other Visit Diagnoses     SOB (shortness of breath)    -  Primary   Relevant Orders   EKG 12-Lead      Shortness of breath Low risk for underlying ischemia given no smoking, very well-controlled cholesterol, no diabetes Unable to exclude diastolic CHF versus obesity, deconditioning Discussed with her in detail, we have ordered echocardiogram No immediate plan for ischemic work-up  Morbid obesity BMI greater than 40 We have encouraged continued exercise, careful diet management in an effort to lose weight.  Bronchitis Resolving symptoms, still with residual cough, recent hospitalization  Leg swelling Reports chronic issue Echo pending Possibly exacerbated by amlodipine   Total encounter time more than 50 minutes  Greater than 50% was spent in counseling and coordination of care with the patient    Signed, Esmond Plants, M.D., Ph.D. [place], [place]

## 2021-09-19 ENCOUNTER — Telehealth: Payer: Self-pay | Admitting: *Deleted

## 2021-09-19 NOTE — Chronic Care Management (AMB) (Signed)
  Care Coordination  Outreach Note  09/19/2021 Name: Kelly Wallace MRN: [id number] DOB: 05-05-1950   Care Coordination Outreach Attempts  An unsuccessful telephone outreach was attempted today to offer the patient information about available care coordination services as a benefit of their health plan.   Follow Up Plan:  Additional outreach attempts will be made to offer the patient care coordination information and services.   Encounter Outcome:  No Answer  Julian Hy, [place] Direct Dial: [phone]

## 2021-09-24 ENCOUNTER — Encounter: Payer: Self-pay | Admitting: Internal Medicine

## 2021-09-24 DIAGNOSIS — H9190 Unspecified hearing loss, unspecified ear: Secondary | ICD-10-CM | POA: Insufficient documentation

## 2021-09-24 NOTE — Assessment & Plan Note (Signed)
Discussed.  Consider ENT referral.

## 2021-09-24 NOTE — Assessment & Plan Note (Signed)
Hospitalized recently as outlined.  Treated with IVFs, bronchodilators and steroids.  Doing better.  Breathing better.

## 2021-09-24 NOTE — Assessment & Plan Note (Signed)
Has seen hematology.  Follow cbc.  

## 2021-09-24 NOTE — Assessment & Plan Note (Signed)
Colonoscopy 04/10/17 - Non-thrombosed external hemorrhoids found on perianal exam. The examined portion of the ileum was normal. One 5 mm polyp in the transverse colon, removed with a cold snare. Resected and retrieved. Non-bleeding external hemorrhoids. Recommended f/u in 5 years (Dr Marius Ditch).

## 2021-09-24 NOTE — Assessment & Plan Note (Signed)
On amlodipine.  Blood pressure as outlined.  Hold on changing medication.  Follow metabolic panel.  Follow pressures.

## 2021-09-25 ENCOUNTER — Inpatient Hospital Stay: Payer: Medicare Other | Attending: Oncology

## 2021-09-25 DIAGNOSIS — E611 Iron deficiency: Secondary | ICD-10-CM

## 2021-09-25 DIAGNOSIS — E538 Deficiency of other specified B group vitamins: Secondary | ICD-10-CM | POA: Insufficient documentation

## 2021-09-25 MED ORDER — CYANOCOBALAMIN 1000 MCG/ML IJ SOLN
1000.0000 ug | Freq: Once | INTRAMUSCULAR | Status: AC
  Administered 2021-09-25: 1000 ug via INTRAMUSCULAR
  Filled 2021-09-25: qty 1

## 2021-10-10 ENCOUNTER — Other Ambulatory Visit: Payer: Medicare Other

## 2021-10-10 NOTE — Chronic Care Management (AMB) (Signed)
  Care Coordination   Note   10/10/2021 Name: Kelly Wallace MRN: [id number] DOB: 11-13-1950  Kelly Wallace is a 71 y.o. year old female who sees Einar Pheasant, MD for primary care. I reached out to [name] by phone today to offer care coordination services.  Rescheduling   [name] was given information about Care Coordination services today including:   The Care Coordination services include support from the care team which includes your Nurse Coordinator, Clinical Social Worker, or Pharmacist.  The Care Coordination team is here to help remove barriers to the health concerns and goals most important to you. Care Coordination services are voluntary, and the patient may decline or stop services at any time by request to their care team member.   Care Coordination Consent Status: Patient agreed to services and verbal consent obtained.   Follow up plan:  Telephone appointment with care coordination team member scheduled for:  10/12/2021  Encounter Outcome:  Pt. Scheduled  Julian Hy, [place] Direct Dial: [phone]

## 2021-10-12 ENCOUNTER — Ambulatory Visit: Payer: Self-pay

## 2021-10-12 NOTE — Patient Outreach (Signed)
  Care Coordination   10/12/2021 Name: TANISIA YOKLEY MRN: [id number] DOB: 1950-03-31   Care Coordination Outreach Attempts:  An unsuccessful telephone outreach was attempted for a scheduled appointment today.  Telephone call to listed home number.  Unable to reach patient or leave voice message due phone only ringing.   Telephone call attempted to patients listed mobile number.  Unable to reach.  HIPAA compliant voice message left with call back phone number.   Follow Up Plan:  Additional outreach attempts will be made to offer the patient care coordination information and services.   Encounter Outcome:  No Answer  Care Coordination Interventions Activated:  No   Care Coordination Interventions:  No, not indicated    Quinn Plowman RN,BSN,CCM [place] [phone] direct line

## 2021-10-18 ENCOUNTER — Other Ambulatory Visit: Payer: Self-pay | Admitting: Oncology

## 2021-10-18 DIAGNOSIS — D509 Iron deficiency anemia, unspecified: Secondary | ICD-10-CM

## 2021-10-19 ENCOUNTER — Other Ambulatory Visit: Payer: Self-pay

## 2021-10-19 DIAGNOSIS — E611 Iron deficiency: Secondary | ICD-10-CM

## 2021-10-19 DIAGNOSIS — E538 Deficiency of other specified B group vitamins: Secondary | ICD-10-CM

## 2021-10-22 ENCOUNTER — Inpatient Hospital Stay: Payer: Medicare Other | Attending: Oncology

## 2021-10-22 DIAGNOSIS — D509 Iron deficiency anemia, unspecified: Secondary | ICD-10-CM | POA: Insufficient documentation

## 2021-10-23 ENCOUNTER — Inpatient Hospital Stay: Payer: Medicare Other

## 2021-10-23 ENCOUNTER — Encounter: Payer: Self-pay | Admitting: Oncology

## 2021-10-23 ENCOUNTER — Inpatient Hospital Stay (HOSPITAL_BASED_OUTPATIENT_CLINIC_OR_DEPARTMENT_OTHER): Payer: Medicare Other | Admitting: Oncology

## 2021-10-23 VITALS — BP 153/80 | HR 74 | Temp 98.1°F | Resp 20 | Wt 232.0 lb

## 2021-10-23 DIAGNOSIS — E538 Deficiency of other specified B group vitamins: Secondary | ICD-10-CM

## 2021-10-23 DIAGNOSIS — E611 Iron deficiency: Secondary | ICD-10-CM

## 2021-10-23 DIAGNOSIS — D509 Iron deficiency anemia, unspecified: Secondary | ICD-10-CM | POA: Diagnosis not present

## 2021-10-23 LAB — CBC WITH DIFFERENTIAL/PLATELET
Abs Immature Granulocytes: 0.01 10*3/uL (ref 0.00–0.07)
Basophils Absolute: 0 10*3/uL (ref 0.0–0.1)
Basophils Relative: 0 %
Eosinophils Absolute: 0.2 10*3/uL (ref 0.0–0.5)
Eosinophils Relative: 3 %
HCT: 40.4 % (ref 36.0–46.0)
Hemoglobin: 13.3 g/dL (ref 12.0–15.0)
Immature Granulocytes: 0 %
Lymphocytes Relative: 40 %
Lymphs Abs: 2.3 10*3/uL (ref 0.7–4.0)
MCH: 30 pg (ref 26.0–34.0)
MCHC: 32.9 g/dL (ref 30.0–36.0)
MCV: 91 fL (ref 80.0–100.0)
Monocytes Absolute: 0.4 10*3/uL (ref 0.1–1.0)
Monocytes Relative: 7 %
Neutro Abs: 2.8 10*3/uL (ref 1.7–7.7)
Neutrophils Relative %: 50 %
Platelets: 186 10*3/uL (ref 150–400)
RBC: 4.44 MIL/uL (ref 3.87–5.11)
RDW: 13 % (ref 11.5–15.5)
WBC: 5.7 10*3/uL (ref 4.0–10.5)
nRBC: 0 % (ref 0.0–0.2)

## 2021-10-23 LAB — IRON AND TIBC
Iron: 90 ug/dL (ref 28–170)
Saturation Ratios: 27 % (ref 10.4–31.8)
TIBC: 336 ug/dL (ref 250–450)
UIBC: 246 ug/dL

## 2021-10-23 LAB — VITAMIN B12: Vitamin B-12: 455 pg/mL (ref 180–914)

## 2021-10-23 LAB — FERRITIN: Ferritin: 70 ng/mL (ref 11–307)

## 2021-10-23 MED ORDER — CYANOCOBALAMIN 1000 MCG/ML IJ SOLN
1000.0000 ug | Freq: Once | INTRAMUSCULAR | Status: AC
  Administered 2021-10-23: 1000 ug via INTRAMUSCULAR
  Filled 2021-10-23: qty 1

## 2021-10-23 NOTE — Progress Notes (Signed)
Hematology/Oncology Consult note [facility]  [phone] [phone]  Patient Care Team: Einar Pheasant, MD as PCP - General (Internal Medicine)   Name of the patient: Kelly Wallace  [id number]  March 23, 1950   Date of visit: 10/23/21  Diagnosis-iron deficiency anemia  Chief complaint/ Reason for visit-routine follow-up of iron deficiency anemia  Heme/Onc history: patient is a 71 year old obese African-American female  who has been referred to [place] for evaluation and management of anemia.  She has a history of gastric bypass surgery back in 2005.  She is not currently taking any iron supplements.    Recent CBC from 02/26/2017 showed white count of 5, H&H of 9.5/30.4 with an MCV of 78.7 and a platelet count of 232.  Ferritin levels were low at 4.9.  Of note ferritin was low at 7.45 months ago as well.  B12 levels were low at 204.  TSH was normal.  On review of her prior CBCs her hemoglobin has been between 9-10 for the last 1 year.  Borderline low MCV levels.  No other cytopenias.  Last colonoscopy in June 2013 showed internal hemorrhoids but no other abnormal findings. pateint had repeat EGD and colonoscopy inmarch 2019 which did not reveal any bleeding.s he received 2 doses of feraheme in march 2019  Interval history-patient reports baseline fatigue but denies any new complaints at this time.  Denies any recent hospitalizations or infections  ECOG PS- 1 Pain scale- 0   Review of systems- Review of Systems  Constitutional:  Positive for malaise/fatigue. Negative for chills, fever and weight loss.  HENT:  Negative for congestion, ear discharge and nosebleeds.   Eyes:  Negative for blurred vision.  Respiratory:  Negative for cough, hemoptysis, sputum production, shortness of breath and wheezing.   Cardiovascular:  Negative for chest pain, palpitations, orthopnea and claudication.  Gastrointestinal:  Negative for abdominal pain, blood in stool,  constipation, diarrhea, heartburn, melena, nausea and vomiting.  Genitourinary:  Negative for dysuria, flank pain, frequency, hematuria and urgency.  Musculoskeletal:  Negative for back pain, joint pain and myalgias.  Skin:  Negative for rash.  Neurological:  Negative for dizziness, tingling, focal weakness, seizures, weakness and headaches.  Endo/Heme/Allergies:  Does not bruise/bleed easily.  Psychiatric/Behavioral:  Negative for depression and suicidal ideas. The patient does not have insomnia.       No Known Allergies   Past Medical History:  Diagnosis Date   Anemia    not currently being treated for this   Arthritis    History of colon polyps    Hypertension      Past Surgical History:  Procedure Laterality Date   CHOLECYSTECTOMY  1989   COLONOSCOPY WITH PROPOFOL N/A 04/10/2017   Procedure: COLONOSCOPY WITH PROPOFOL;  Surgeon: Lin Landsman, MD;  Location[facility] ENDOSCOPY;  Service: Gastroenterology;  Laterality: N/A;   ESOPHAGOGASTRODUODENOSCOPY (EGD) WITH PROPOFOL N/A 04/10/2017   Procedure: ESOPHAGOGASTRODUODENOSCOPY (EGD) WITH PROPOFOL;  Surgeon: Lin Landsman, MD;  Location: [facility] ENDOSCOPY;  Service: Gastroenterology;  Laterality: N/A;   GASTRIC BYPASS  03/21/03   JOINT REPLACEMENT Right 04/2014   TOTAL KNEE REPLACEMENT, DR. Rudene Christians, [place]   TOTAL KNEE ARTHROPLASTY Left 02/27/2016   Procedure: TOTAL KNEE ARTHROPLASTY;  Surgeon: Hessie Knows, MD;  Location: [facility] ORS;  Service: Orthopedics;  Laterality: Left;   TUBAL LIGATION  1976    Social History   Socioeconomic History   Marital status: Married    Spouse name: Not on file   Number of  children: 2   Years of education: Not on file   Highest education level: Not on file  Occupational History    Employer: [employer]  Tobacco Use   Smoking status: Never   Smokeless tobacco: Never  Vaping Use   Vaping Use: Never used  Substance and Sexual Activity   Alcohol use: No    Alcohol/week: 0.0 standard drinks of  alcohol   Drug use: No   Sexual activity: Yes  Other Topics Concern   Not on file  Social History Narrative   Not on file   Social Determinants of Health   Financial Resource Strain: Low Risk  (01/09/2021)   Overall Financial Resource Strain (CARDIA)    Difficulty of Paying Living Expenses: Not hard at all  Food Insecurity: No Food Insecurity (08/21/2021)   Hunger Vital Sign    Worried About Running Out of Food in the Last Year: Never true    Ran Out of Food in the Last Year: Never true  Transportation Needs: No Transportation Needs (08/21/2021)   PRAPARE - Hydrologist (Medical): No    Lack of Transportation (Non-Medical): No  Physical Activity: Sufficiently Active (01/09/2021)   Exercise Vital Sign    Days of Exercise per Week: 5 days    Minutes of Exercise per Session: 30 min  Stress: No Stress Concern Present (01/09/2021)   [place]    Feeling of Stress : Not at all  Social Connections: Unknown (01/09/2021)   Social Connection and Isolation Panel [NHANES]    Frequency of Communication with Friends and Family: More than three times a week    Frequency of Social Gatherings with Friends and Family: More than three times a week    Attends Religious Services: Not on file    Active Member of [place] or Organizations: Not on file    Attends Archivist Meetings: Not on file    Marital Status: Not on file  Intimate Partner Violence: Not At Risk (08/21/2021)   Humiliation, Afraid, Rape, and Kick questionnaire    Fear of Current or Ex-Partner: No    Emotionally Abused: No    Physically Abused: No    Sexually Abused: No    Family History  Problem Relation Age of Onset   Hypertension Mother    Diabetes Mother    Heart disease Father    Cancer Maternal Aunt        question of type   Cancer Maternal Grandmother        colon   Breast cancer Cousin 79       maternal side      Current Outpatient Medications:    amLODipine (NORVASC) 5 MG tablet, Take 1 tablet (5 mg total) by mouth daily., Disp: 90 tablet, Rfl: 1   polyethylene glycol powder (GLYCOLAX/MIRALAX) 17 GM/SCOOP powder, Take 17 g by mouth daily. PRN, Disp: , Rfl:   Physical exam:  Vitals:   10/23/21 1309  BP: (!) 153/80  Pulse: 74  Resp: 20  Temp: 98.1 F (36.7 C)  SpO2: 98%  Weight: 232 lb (105.2 kg)   Physical Exam Constitutional:      General: She is not in acute distress. Cardiovascular:     Rate and Rhythm: Normal rate and regular rhythm.     Heart sounds: Normal heart sounds.  Pulmonary:     Effort: Pulmonary effort is normal.     Breath sounds: Normal breath sounds.  Abdominal:  General: Bowel sounds are normal.     Palpations: Abdomen is soft.  Skin:    General: Skin is warm and dry.  Neurological:     Mental Status: She is alert and oriented to person, place, and time.         Latest Ref Rng & Units 08/26/2021    1:13 AM  CMP  Creatinine 0.44 - 1.00 mg/dL 0.78       Latest Ref Rng & Units 10/23/2021    1:34 PM  CBC  WBC 4.0 - 10.5 K/uL 5.7   Hemoglobin 12.0 - 15.0 g/dL 13.3   Hematocrit 36.0 - 46.0 % 40.4   Platelets 150 - 400 K/uL 186      Assessment and plan- Patient is a 71 y.o. female here for routine follow-up of iron deficiency anemia  Patient has not had any recent labs checked.  I will plan to get her CBC ferritin and iron studies and B12 levels today.  Overall her hemoglobin has remained between 12-13 up until last month.  I doubt that she requires any IV iron at this time.  Patient will also continue to receive monthly B12 injections at the cancer center.  I will repeat labs in 6 months in 1 year and see her back in 1 year   Visit Diagnosis 1. Iron deficiency      Dr. Randa Evens, MD, MPH [facility] at [facility] [phone] 10/23/2021 2:41 PM

## 2021-10-25 ENCOUNTER — Ambulatory Visit: Payer: Medicare Other

## 2021-11-07 ENCOUNTER — Encounter: Payer: Self-pay | Admitting: Oncology

## 2021-11-07 NOTE — Telephone Encounter (Signed)
Signing encounter, See previous note 06/22/19

## 2021-11-23 ENCOUNTER — Inpatient Hospital Stay: Payer: Medicare Other | Attending: Oncology

## 2021-12-07 ENCOUNTER — Ambulatory Visit: Payer: Medicare Other | Attending: Cardiovascular Disease

## 2021-12-07 DIAGNOSIS — R0602 Shortness of breath: Secondary | ICD-10-CM | POA: Diagnosis not present

## 2021-12-07 LAB — ECHOCARDIOGRAM COMPLETE
AR max vel: 3.48 cm2
AV Area VTI: 3.64 cm2
AV Area mean vel: 3.51 cm2
AV Mean grad: 3 mmHg
AV Peak grad: 6.7 mmHg
AV Vena cont: 0.4 cm
Ao pk vel: 1.29 m/s
Area-P 1/2: 3.03 cm2
Calc EF: 55.5 %
P 1/2 time: 369 msec
S' Lateral: 2.9 cm
Single Plane A2C EF: 57.3 %
Single Plane A4C EF: 55.5 %

## 2021-12-10 ENCOUNTER — Other Ambulatory Visit: Payer: Self-pay

## 2021-12-10 DIAGNOSIS — Z79899 Other long term (current) drug therapy: Secondary | ICD-10-CM

## 2021-12-10 MED ORDER — POTASSIUM CHLORIDE ER 10 MEQ PO TBCR
10.0000 meq | EXTENDED_RELEASE_TABLET | ORAL | 3 refills | Status: DC
  Filled 2021-12-10: qty 36, 84d supply, fill #0
  Filled 2022-09-16: qty 36, 84d supply, fill #1

## 2021-12-10 MED ORDER — FUROSEMIDE 20 MG PO TABS
20.0000 mg | ORAL_TABLET | ORAL | 3 refills | Status: DC
  Filled 2021-12-10: qty 36, 84d supply, fill #0
  Filled 2022-09-16: qty 36, 84d supply, fill #1

## 2021-12-24 ENCOUNTER — Inpatient Hospital Stay: Payer: Medicare Other

## 2021-12-25 ENCOUNTER — Inpatient Hospital Stay: Payer: Medicare Other | Attending: Oncology

## 2021-12-25 DIAGNOSIS — E538 Deficiency of other specified B group vitamins: Secondary | ICD-10-CM | POA: Diagnosis not present

## 2021-12-25 DIAGNOSIS — E611 Iron deficiency: Secondary | ICD-10-CM

## 2021-12-25 MED ORDER — CYANOCOBALAMIN 1000 MCG/ML IJ SOLN
1000.0000 ug | Freq: Once | INTRAMUSCULAR | Status: AC
  Administered 2021-12-25: 1000 ug via INTRAMUSCULAR
  Filled 2021-12-25: qty 1

## 2022-01-24 ENCOUNTER — Inpatient Hospital Stay: Payer: Medicare Other

## 2022-01-25 ENCOUNTER — Inpatient Hospital Stay: Payer: Medicare Other | Attending: Oncology

## 2022-01-25 DIAGNOSIS — E538 Deficiency of other specified B group vitamins: Secondary | ICD-10-CM | POA: Insufficient documentation

## 2022-01-28 ENCOUNTER — Inpatient Hospital Stay: Payer: Medicare Other

## 2022-01-28 DIAGNOSIS — E538 Deficiency of other specified B group vitamins: Secondary | ICD-10-CM

## 2022-01-28 DIAGNOSIS — E611 Iron deficiency: Secondary | ICD-10-CM

## 2022-01-28 MED ORDER — CYANOCOBALAMIN 1000 MCG/ML IJ SOLN
1000.0000 ug | Freq: Once | INTRAMUSCULAR | Status: AC
  Administered 2022-01-28: 1000 ug via INTRAMUSCULAR
  Filled 2022-01-28: qty 1

## 2022-02-01 ENCOUNTER — Telehealth: Payer: Self-pay

## 2022-02-01 NOTE — Telephone Encounter (Signed)
Unable to reach patient for scheduled AWV. No answer, no voicemail. Okay to reschedule.

## 2022-02-25 ENCOUNTER — Inpatient Hospital Stay: Payer: Medicare Other | Attending: Oncology

## 2022-02-25 ENCOUNTER — Inpatient Hospital Stay: Payer: Medicare Other

## 2022-02-25 DIAGNOSIS — E538 Deficiency of other specified B group vitamins: Secondary | ICD-10-CM | POA: Insufficient documentation

## 2022-02-25 DIAGNOSIS — E611 Iron deficiency: Secondary | ICD-10-CM

## 2022-02-25 MED ORDER — CYANOCOBALAMIN 1000 MCG/ML IJ SOLN
1000.0000 ug | Freq: Once | INTRAMUSCULAR | Status: AC
  Administered 2022-02-25: 1000 ug via INTRAMUSCULAR
  Filled 2022-02-25: qty 1

## 2022-03-26 ENCOUNTER — Inpatient Hospital Stay: Payer: Medicare Other

## 2022-03-26 ENCOUNTER — Inpatient Hospital Stay: Payer: Medicare Other | Attending: Oncology

## 2022-03-26 DIAGNOSIS — E538 Deficiency of other specified B group vitamins: Secondary | ICD-10-CM | POA: Diagnosis not present

## 2022-03-26 DIAGNOSIS — E611 Iron deficiency: Secondary | ICD-10-CM

## 2022-03-26 MED ORDER — CYANOCOBALAMIN 1000 MCG/ML IJ SOLN
1000.0000 ug | Freq: Once | INTRAMUSCULAR | Status: AC
  Administered 2022-03-26: 1000 ug via INTRAMUSCULAR
  Filled 2022-03-26: qty 1

## 2022-03-28 ENCOUNTER — Telehealth: Payer: Self-pay | Admitting: Internal Medicine

## 2022-03-28 NOTE — Telephone Encounter (Signed)
Contacted Kelly Wallace to schedule their annual wellness visit. Appointment made for 04/05/2022.  Thank you,  [place] Direct dial  [phone]

## 2022-03-29 ENCOUNTER — Telehealth: Payer: Self-pay

## 2022-03-29 NOTE — Patient Outreach (Signed)
  Care Coordination   Initial Visit Note   03/29/2022 Name: Kelly Wallace MRN: [id number] DOB: 1950/10/20  Kelly Wallace is a 72 y.o. year old female who sees Einar Pheasant, MD for primary care. I spoke with  Kelly Wallace by phone today.  What matters to the patients health and wellness today?  Patient verbalizes no nursing or community resource needs.      Goals Addressed             This Visit's Progress    COMPLETED: Care coordination activities - no follow up required       Interventions Today    Flowsheet Row Most Recent Value  General Interventions   General Interventions Discussed/Reviewed General Interventions Discussed  [Care coordination services discussed and offered. SDOH assessment completed. Discussed annual wellness visit. Patient advised to contact provider if care coordination services needed in the future.]              SDOH assessments and interventions completed:  Yes  SDOH Interventions Today    Flowsheet Row Most Recent Value  SDOH Interventions   Food Insecurity Interventions Intervention Not Indicated  Housing Interventions Intervention Not Indicated  Transportation Interventions Intervention Not Indicated        Care Coordination Interventions:  Yes, provided   Follow up plan: No further intervention required.   Encounter Outcome:  Pt. Visit Completed   Quinn Plowman RN,BSN,CCM [place] [phone] direct line

## 2022-04-05 ENCOUNTER — Ambulatory Visit (INDEPENDENT_AMBULATORY_CARE_PROVIDER_SITE_OTHER): Payer: Medicare Other

## 2022-04-05 VITALS — Ht 64.0 in | Wt 232.0 lb

## 2022-04-05 DIAGNOSIS — Z1231 Encounter for screening mammogram for malignant neoplasm of breast: Secondary | ICD-10-CM

## 2022-04-05 DIAGNOSIS — Z780 Asymptomatic menopausal state: Secondary | ICD-10-CM

## 2022-04-05 DIAGNOSIS — Z0000 Encounter for general adult medical examination without abnormal findings: Secondary | ICD-10-CM | POA: Diagnosis not present

## 2022-04-05 NOTE — Progress Notes (Signed)
Subjective:   Kelly Wallace is a 72 y.o. female who presents for Medicare Annual (Subsequent) preventive examination.  Review of Systems    No ROS.  Medicare Wellness Virtual Visit.  Visual/audio telehealth visit, UTA vital signs.   See social history for additional risk factors.   Cardiac Risk Factors include: advanced age (>97men, >29 women)     Objective:    Today's Vitals   04/05/22 1311  Weight: 232 lb (105.2 kg)   Body mass index is 39.82 kg/m.     10/23/2021    1:05 PM 08/26/2021    3:26 AM 08/25/2021    7:53 PM 01/09/2021   10:38 AM 10/23/2020   12:52 PM 01/07/2020   10:46 AM 10/21/2019    1:27 PM  Advanced Directives  Does Patient Have a Medical Advance Directive? No  No No No No No  Would patient like information on creating a medical advance directive? No - Patient declined No - Patient declined  No - Patient declined No - Patient declined No - Patient declined No - Patient declined    Current Medications (verified) Outpatient Encounter Medications as of 04/05/2022  Medication Sig   amLODipine (NORVASC) 5 MG tablet Take 1 tablet (5 mg total) by mouth daily.   furosemide (LASIX) 20 MG tablet Take 1 tablet (20 mg total) by mouth every Monday, Wednesday, and Friday.   polyethylene glycol powder (GLYCOLAX/MIRALAX) 17 GM/SCOOP powder Take 17 g by mouth daily. PRN   potassium chloride (KLOR-CON) 10 MEQ tablet Take 1 tablet (10 mEq total) by mouth every Monday, Wednesday, and Friday.   No facility-administered encounter medications on file as of 04/05/2022.    Allergies (verified) Patient has no known allergies.   History: Past Medical History:  Diagnosis Date   Anemia    not currently being treated for this   Arthritis    History of colon polyps    Hypertension    Past Surgical History:  Procedure Laterality Date   CHOLECYSTECTOMY  1989   COLONOSCOPY WITH PROPOFOL N/A 04/10/2017   Procedure: COLONOSCOPY WITH PROPOFOL;  Surgeon: Lin Landsman, MD;   Location: [facility] ENDOSCOPY;  Service: Gastroenterology;  Laterality: N/A;   ESOPHAGOGASTRODUODENOSCOPY (EGD) WITH PROPOFOL N/A 04/10/2017   Procedure: ESOPHAGOGASTRODUODENOSCOPY (EGD) WITH PROPOFOL;  Surgeon: Lin Landsman, MD;  Location: [facility] ENDOSCOPY;  Service: Gastroenterology;  Laterality: N/A;   GASTRIC BYPASS  03/21/03   JOINT REPLACEMENT Right 04/2014   TOTAL KNEE REPLACEMENT, DR. Rudene Christians, [place]   TOTAL KNEE ARTHROPLASTY Left 02/27/2016   Procedure: TOTAL KNEE ARTHROPLASTY;  Surgeon: Hessie Knows, MD;  Location: [facility] ORS;  Service: Orthopedics;  Laterality: Left;   TUBAL LIGATION  1976   Family History  Problem Relation Age of Onset   Hypertension Mother    Diabetes Mother    Heart disease Father    Cancer Maternal Aunt        question of type   Cancer Maternal Grandmother        colon   Breast cancer Cousin 38       maternal side   Social History   Socioeconomic History   Marital status: Married    Spouse name: Not on file   Number of children: 2   Years of education: Not on file   Highest education level: Not on file  Occupational History    Employer: [employer]  Tobacco Use   Smoking status: Never   Smokeless tobacco: Never  Vaping Use   Vaping Use: Never  used  Substance and Sexual Activity   Alcohol use: No    Alcohol/week: 0.0 standard drinks of alcohol   Drug use: No   Sexual activity: Yes  Other Topics Concern   Not on file  Social History Narrative   Not on file   Social Determinants of Health   Financial Resource Strain: Low Risk  (04/05/2022)   Overall Financial Resource Strain (CARDIA)    Difficulty of Paying Living Expenses: Not hard at all  Food Insecurity: No Food Insecurity (04/05/2022)   Hunger Vital Sign    Worried About Running Out of Food in the Last Year: Never true    Ran Out of Food in the Last Year: Never true  Transportation Needs: No Transportation Needs (04/05/2022)   PRAPARE - Hydrologist (Medical): No     Lack of Transportation (Non-Medical): No  Physical Activity: Sufficiently Active (04/05/2022)   Exercise Vital Sign    Days of Exercise per Week: 5 days    Minutes of Exercise per Session: 30 min  Stress: No Stress Concern Present (04/05/2022)   [place]    Feeling of Stress : Not at all  Social Connections: Unknown (04/05/2022)   Social Connection and Isolation Panel [NHANES]    Frequency of Communication with Friends and Family: More than three times a week    Frequency of Social Gatherings with Friends and Family: More than three times a week    Attends Religious Services: More than 4 times per year    Active Member of [employer] or Organizations: Yes    Attends Music therapist: More than 4 times per year    Marital Status: Not on file    Tobacco Counseling Counseling given: Not Answered   Clinical Intake:  Pre-visit preparation completed: Yes        Diabetes: No  How often do you need to have someone help you when you read instructions, pamphlets, or other written materials from your doctor or pharmacy?: 1 - Never    Interpreter Needed?: No      Activities of Daily Living    04/05/2022    1:17 PM 08/26/2021    2:09 AM  In your present state of health, do you have any difficulty performing the following activities:  Hearing? 1   Comment Dx Hearing loss   Vision? 0   Difficulty concentrating or making decisions? 0   Walking or climbing stairs? 0   Dressing or bathing? 0   Doing errands, shopping? 0 0  Preparing Food and eating ? N   Using the Toilet? N   In the past six months, have you accidently leaked urine? N   Do you have problems with loss of bowel control? N   Managing your Medications? N   Managing your Finances? N   Housekeeping or managing your Housekeeping? N     Patient Care Team: Einar Pheasant, MD as PCP - General (Internal Medicine) Sindy Guadeloupe, MD as Consulting  Physician (Oncology)  Indicate any recent Medical Services you may have received from other than [facility] providers in the past year (date may be approximate).     Assessment:   This is a routine wellness examination for Kelly Wallace.  I connected with  Kelly Wallace on 04/05/22 by a audio enabled telemedicine application and verified that I am speaking with the correct person using two identifiers.  Patient Location: Home  Provider Location: Office/Clinic  I discussed the limitations of evaluation and management by telemedicine. The patient expressed understanding and agreed to proceed.   Hearing/Vision screen Hearing Screening - Comments:: Hearing loss Vision Screening - Comments:: Wears corrective lenses They have seen their ophthalmologist in the last 12 months.  Dietary issues and exercise activities discussed: Current Exercise Habits: Home exercise routine, Type of exercise: walking, Time (Minutes): 30, Frequency (Times/Week): 5, Weekly Exercise (Minutes/Week): 150, Intensity: Mild   Goals Addressed               This Visit's Progress     Patient Stated     Follow up with Provider (pt-stated)        As scheduled       Depression Screen    04/05/2022    1:15 PM 09/14/2021    1:11 PM 07/18/2021   12:32 PM 01/09/2021   10:36 AM 08/01/2020    1:07 PM 01/07/2020   10:45 AM 01/06/2019   10:43 AM  PHQ 2/9 Scores  PHQ - 2 Score 0 0 0 0 0 0 0    Fall Risk    04/05/2022    1:17 PM 09/14/2021    1:11 PM 07/18/2021   12:32 PM 01/09/2021   10:39 AM 08/01/2020    1:07 PM  [place] in the past year? 0 0 0 0 0  Number falls in past yr: 0 0 0 0 0  Injury with Fall? 0 0 0  0  Risk for fall due to :  No Fall Risks No Fall Risks    Follow up Falls evaluation completed;Falls prevention discussed Falls evaluation completed Falls evaluation completed Falls evaluation completed Falls evaluation completed    FALL RISK PREVENTION PERTAINING TO THE HOME: Home free of loose  throw rugs in walkways, pet beds, electrical cords, etc? Yes  Adequate lighting in your home to reduce risk of falls? Yes   ASSISTIVE DEVICES UTILIZED TO PREVENT FALLS: Life alert? No  Use of a cane, walker or w/c? Yes  Grab bars in the bathroom? No  Shower chair or bench in shower? No  Elevated toilet seat or a handicapped toilet? No   TIMED UP AND GO: Was the test performed? No .   Cognitive Function:        04/05/2022    1:19 PM 01/06/2019   10:40 AM  6CIT Screen  What Year? 0 points 0 points  What month? 0 points 0 points  What time? 0 points 0 points  Count back from 20 0 points 0 points  Months in reverse 0 points 2 points  Repeat phrase 0 points 2 points  Total Score 0 points 4 points    Immunizations Immunization History  Administered Date(s) Administered   Fluad Quad(high Dose 65+) 10/22/2019, 11/04/2021   Influenza Split 10/27/2012, 10/21/2013   Influenza, High Dose Seasonal PF 09/09/2020   Moderna Sars-Covid-2 Vaccination 02/24/2019, 03/25/2019, 12/06/2019   TDAP status: Due, Education has been provided regarding the importance of this vaccine. Advised may receive this vaccine at local pharmacy or Health Dept. Aware to provide a copy of the vaccination record if obtained from local pharmacy or Health Dept. Verbalized acceptance and understanding.  Flu Vaccine status: Declined, Education has been provided regarding the importance of this vaccine but patient still declined. Advised may receive this vaccine at local pharmacy or Health Dept. Aware to provide a copy of the vaccination record if obtained from local pharmacy or Health Dept. Verbalized acceptance and understanding.  Pneumococcal vaccine status: Due, Education has been provided regarding the importance of this vaccine. Advised may receive this vaccine at local pharmacy or Health Dept. Aware to provide a copy of the vaccination record if obtained from local pharmacy or Health Dept. Verbalized acceptance and  understanding.  Covid-19 vaccine status: Completed vaccines x3.    Shingrix Completed?: No.    Education has been provided regarding the importance of this vaccine. Patient has been advised to call insurance company to determine out of pocket expense if they have not yet received this vaccine. Advised may also receive vaccine at local pharmacy or Health Dept. Verbalized acceptance and understanding.  Screening Tests Health Maintenance  Topic Date Due   DTaP/Tdap/Td (1 - Tdap) Never done   Zoster Vaccines- Shingrix (1 of 2) Never done   DEXA SCAN  Never done   MAMMOGRAM  03/05/2022   COVID-19 Vaccine (4 - 2023-24 season) 04/21/2022 (Originally 09/21/2021)   Pneumonia Vaccine 52+ Years old (1 of 1 - PCV) 04/05/2023 (Originally 05/28/2017)   COLONOSCOPY (Pts 45-20yrs Insurance coverage will need to be confirmed)  04/11/2022   Medicare Annual Wellness (AWV)  04/05/2023   INFLUENZA VACCINE  Completed   Hepatitis C Screening  Completed   HPV VACCINES  Aged Out    Health Maintenance Health Maintenance Due  Topic Date Due   DTaP/Tdap/Td (1 - Tdap) Never done   Zoster Vaccines- Shingrix (1 of 2) Never done   DEXA SCAN  Never done   MAMMOGRAM  03/05/2022   Mammogram- ordered per consent. Number provided for scheduling [phone].  Bone density- ordered per consent.   Lung Cancer Screening: (Low Dose CT Chest recommended if Age 30-80 years, 30 pack-year currently smoking OR have quit w/in 15years.) does not qualify.   Hepatitis C Screening: Completed 03/2019.  Vision Screening: Recommended annual ophthalmology exams for early detection of glaucoma and other disorders of the eye.  Dental Screening: Recommended annual dental exams for proper oral hygiene.  Community Resource Referral / Chronic Care Management: CRR required this visit?  No   CCM required this visit?  No      Plan:     I have personally reviewed and noted the following in the patient's chart:   Medical and social  history Use of alcohol, tobacco or illicit drugs  Current medications and supplements including opioid prescriptions. Patient is not currently taking opioid prescriptions. Functional ability and status Nutritional status Physical activity Advanced directives List of other physicians Hospitalizations, surgeries, and ER visits in previous 12 months Vitals Screenings to include cognitive, depression, and falls Referrals and appointments  In addition, I have reviewed and discussed with patient certain preventive protocols, quality metrics, and best practice recommendations. A written personalized care plan for preventive services as well as general preventive health recommendations were provided to patient.     Leta Jungling, LPN   [id number]

## 2022-04-05 NOTE — Patient Instructions (Addendum)
Kelly Wallace , Thank you for taking time to come for your Medicare Wellness Visit. I appreciate your ongoing commitment to your health goals. Please review the following plan we discussed and let me know if I can assist you in the future.   These are the goals we discussed:  Goals Addressed               This Visit's Progress     Patient Stated     Follow up with Provider (pt-stated)        As scheduled         This is a list of the screening recommended for you and due dates:  Health Maintenance  Topic Date Due   DTaP/Tdap/Td vaccine (1 - Tdap) Never done   Zoster (Shingles) Vaccine (1 of 2) Never done   DEXA scan (bone density measurement)  Never done   Mammogram  03/05/2022   COVID-19 Vaccine (4 - 2023-24 season) 04/21/2022*   Pneumonia Vaccine (1 of 1 - PCV) 04/05/2023*   Colon Cancer Screening  04/11/2022   Medicare Annual Wellness Visit  04/05/2023   Flu Shot  Completed   Hepatitis C Screening: USPSTF Recommendation to screen - Ages 18-79 yo.  Completed   HPV Vaccine  Aged Out  *Topic was postponed. The date shown is not the original due date.    Conditions/risks identified: none new  Next appointment: Follow up in one year for your annual wellness visit    Preventive Care 65 Years and Older, Female Preventive care refers to lifestyle choices and visits with your health care provider that can promote health and wellness. What does preventive care include? A yearly physical exam. This is also called an annual well check. Dental exams once or twice a year. Routine eye exams. Ask your health care provider how often you should have your eyes checked. Personal lifestyle choices, including: Daily care of your teeth and gums. Regular physical activity. Eating a healthy diet. Avoiding tobacco and drug use. Limiting alcohol use. Practicing safe sex. Taking low-dose aspirin every day. Taking vitamin and mineral supplements as recommended by your health care  provider. What happens during an annual well check? The services and screenings done by your health care provider during your annual well check will depend on your age, overall health, lifestyle risk factors, and family history of disease. Counseling  Your health care provider may ask you questions about your: Alcohol use. Tobacco use. Drug use. Emotional well-being. Home and relationship well-being. Sexual activity. Eating habits. History of falls. Memory and ability to understand (cognition). Work and work Statistician. Reproductive health. Screening  You may have the following tests or measurements: Height, weight, and BMI. Blood pressure. Lipid and cholesterol levels. These may be checked every 5 years, or more frequently if you are over 8 years old. Skin check. Lung cancer screening. You may have this screening every year starting at age 66 if you have a 30-pack-year history of smoking and currently smoke or have quit within the past 15 years. Fecal occult blood test (FOBT) of the stool. You may have this test every year starting at age 55. Flexible sigmoidoscopy or colonoscopy. You may have a sigmoidoscopy every 5 years or a colonoscopy every 10 years starting at age 69. Hepatitis C blood test. Hepatitis B blood test. Sexually transmitted disease (STD) testing. Diabetes screening. This is done by checking your blood sugar (glucose) after you have not eaten for a while (fasting). You may have this done every  1-3 years. Bone density scan. This is done to screen for osteoporosis. You may have this done starting at age 46. Mammogram. This may be done every 1-2 years. Talk to your health care provider about how often you should have regular mammograms. Talk with your health care provider about your test results, treatment options, and if necessary, the need for more tests. Vaccines  Your health care provider may recommend certain vaccines, such as: Influenza vaccine. This is  recommended every year. Tetanus, diphtheria, and acellular pertussis (Tdap, Td) vaccine. You may need a Td booster every 10 years. Zoster vaccine. You may need this after age 61. Pneumococcal 13-valent conjugate (PCV13) vaccine. One dose is recommended after age 50. Pneumococcal polysaccharide (PPSV23) vaccine. One dose is recommended after age 47. Talk to your health care provider about which screenings and vaccines you need and how often you need them. This information is not intended to replace advice given to you by your health care provider. Make sure you discuss any questions you have with your health care provider. Document Released: 02/03/2015 Document Revised: 09/27/2015 Document Reviewed: 11/08/2014 Elsevier Interactive Patient Education  2017 [place] Prevention in the Home Falls can cause injuries. They can happen to people of all ages. There are many things you can do to make your home safe and to help prevent falls. What can I do on the outside of my home? Regularly fix the edges of walkways and driveways and fix any cracks. Remove anything that might make you trip as you walk through a door, such as a raised step or threshold. Trim any bushes or trees on the path to your home. Use bright outdoor lighting. Clear any walking paths of anything that might make someone trip, such as rocks or tools. Regularly check to see if handrails are loose or broken. Make sure that both sides of any steps have handrails. Any raised decks and porches should have guardrails on the edges. Have any leaves, snow, or ice cleared regularly. Use sand or salt on walking paths during winter. Clean up any spills in your garage right away. This includes oil or grease spills. What can I do in the bathroom? Use night lights. Install grab bars by the toilet and in the tub and shower. Do not use towel bars as grab bars. Use non-skid mats or decals in the tub or shower. If you need to sit down in  the shower, use a plastic, non-slip stool. Keep the floor dry. Clean up any water that spills on the floor as soon as it happens. Remove soap buildup in the tub or shower regularly. Attach bath mats securely with double-sided non-slip rug tape. Do not have throw rugs and other things on the floor that can make you trip. What can I do in the bedroom? Use night lights. Make sure that you have a light by your bed that is easy to reach. Do not use any sheets or blankets that are too big for your bed. They should not hang down onto the floor. Have a firm chair that has side arms. You can use this for support while you get dressed. Do not have throw rugs and other things on the floor that can make you trip. What can I do in the kitchen? Clean up any spills right away. Avoid walking on wet floors. Keep items that you use a lot in easy-to-reach places. If you need to reach something above you, use a strong step stool that has a  grab bar. Keep electrical cords out of the way. Do not use floor polish or wax that makes floors slippery. If you must use wax, use non-skid floor wax. Do not have throw rugs and other things on the floor that can make you trip. What can I do with my stairs? Do not leave any items on the stairs. Make sure that there are handrails on both sides of the stairs and use them. Fix handrails that are broken or loose. Make sure that handrails are as long as the stairways. Check any carpeting to make sure that it is firmly attached to the stairs. Fix any carpet that is loose or worn. Avoid having throw rugs at the top or bottom of the stairs. If you do have throw rugs, attach them to the floor with carpet tape. Make sure that you have a light switch at the top of the stairs and the bottom of the stairs. If you do not have them, ask someone to add them for you. What else can I do to help prevent falls? Wear shoes that: Do not have high heels. Have rubber bottoms. Are comfortable  and fit you well. Are closed at the toe. Do not wear sandals. If you use a stepladder: Make sure that it is fully opened. Do not climb a closed stepladder. Make sure that both sides of the stepladder are locked into place. Ask someone to hold it for you, if possible. Clearly mark and make sure that you can see: Any grab bars or handrails. First and last steps. Where the edge of each step is. Use tools that help you move around (mobility aids) if they are needed. These include: Canes. Walkers. Scooters. Crutches. Turn on the lights when you go into a dark area. Replace any light bulbs as soon as they burn out. Set up your furniture so you have a clear path. Avoid moving your furniture around. If any of your floors are uneven, fix them. If there are any pets around you, be aware of where they are. Review your medicines with your doctor. Some medicines can make you feel dizzy. This can increase your chance of falling. Ask your doctor what other things that you can do to help prevent falls. This information is not intended to replace advice given to you by your health care provider. Make sure you discuss any questions you have with your health care provider. Document Released: 11/03/2008 Document Revised: 06/15/2015 Document Reviewed: 02/11/2014 Elsevier Interactive Patient Education  2017 [employer].

## 2022-04-26 ENCOUNTER — Inpatient Hospital Stay: Payer: Medicare Other

## 2022-04-26 ENCOUNTER — Inpatient Hospital Stay: Payer: Medicare Other | Attending: Oncology

## 2022-04-26 DIAGNOSIS — E611 Iron deficiency: Secondary | ICD-10-CM

## 2022-04-26 DIAGNOSIS — E538 Deficiency of other specified B group vitamins: Secondary | ICD-10-CM | POA: Diagnosis not present

## 2022-04-26 DIAGNOSIS — D509 Iron deficiency anemia, unspecified: Secondary | ICD-10-CM | POA: Insufficient documentation

## 2022-04-26 LAB — CBC WITH DIFFERENTIAL/PLATELET
Abs Immature Granulocytes: 0.01 10*3/uL (ref 0.00–0.07)
Basophils Absolute: 0 10*3/uL (ref 0.0–0.1)
Basophils Relative: 0 %
Eosinophils Absolute: 0.2 10*3/uL (ref 0.0–0.5)
Eosinophils Relative: 2 %
HCT: 40.6 % (ref 36.0–46.0)
Hemoglobin: 13.1 g/dL (ref 12.0–15.0)
Immature Granulocytes: 0 %
Lymphocytes Relative: 31 %
Lymphs Abs: 2.4 10*3/uL (ref 0.7–4.0)
MCH: 30 pg (ref 26.0–34.0)
MCHC: 32.3 g/dL (ref 30.0–36.0)
MCV: 92.9 fL (ref 80.0–100.0)
Monocytes Absolute: 0.4 10*3/uL (ref 0.1–1.0)
Monocytes Relative: 6 %
Neutro Abs: 4.7 10*3/uL (ref 1.7–7.7)
Neutrophils Relative %: 61 %
Platelets: 187 10*3/uL (ref 150–400)
RBC: 4.37 MIL/uL (ref 3.87–5.11)
RDW: 13 % (ref 11.5–15.5)
WBC: 7.8 10*3/uL (ref 4.0–10.5)
nRBC: 0 % (ref 0.0–0.2)

## 2022-04-26 LAB — FERRITIN: Ferritin: 35 ng/mL (ref 11–307)

## 2022-04-26 LAB — IRON AND TIBC
Iron: 74 ug/dL (ref 28–170)
Saturation Ratios: 21 % (ref 10.4–31.8)
TIBC: 361 ug/dL (ref 250–450)
UIBC: 287 ug/dL

## 2022-04-26 LAB — VITAMIN B12: Vitamin B-12: 769 pg/mL (ref 180–914)

## 2022-04-26 MED ORDER — CYANOCOBALAMIN 1000 MCG/ML IJ SOLN
1000.0000 ug | Freq: Once | INTRAMUSCULAR | Status: AC
  Administered 2022-04-26: 1000 ug via INTRAMUSCULAR
  Filled 2022-04-26: qty 1

## 2022-05-13 ENCOUNTER — Other Ambulatory Visit: Payer: Self-pay

## 2022-05-14 ENCOUNTER — Emergency Department: Payer: Medicare Other

## 2022-05-14 ENCOUNTER — Emergency Department
Admission: EM | Admit: 2022-05-14 | Discharge: 2022-05-14 | Disposition: A | Discharge status: Home / Self Care | Payer: Medicare Other | Attending: Emergency Medicine | Admitting: Emergency Medicine

## 2022-05-14 ENCOUNTER — Other Ambulatory Visit: Payer: Self-pay

## 2022-05-14 DIAGNOSIS — J209 Acute bronchitis, unspecified: Secondary | ICD-10-CM | POA: Insufficient documentation

## 2022-05-14 DIAGNOSIS — I10 Essential (primary) hypertension: Secondary | ICD-10-CM | POA: Insufficient documentation

## 2022-05-14 DIAGNOSIS — R0602 Shortness of breath: Secondary | ICD-10-CM | POA: Diagnosis not present

## 2022-05-14 DIAGNOSIS — Z1152 Encounter for screening for COVID-19: Secondary | ICD-10-CM | POA: Insufficient documentation

## 2022-05-14 LAB — CBC
HCT: 41.4 % (ref 36.0–46.0)
Hemoglobin: 13.6 g/dL (ref 12.0–15.0)
MCH: 29.6 pg (ref 26.0–34.0)
MCHC: 32.9 g/dL (ref 30.0–36.0)
MCV: 90 fL (ref 80.0–100.0)
Platelets: 278 10*3/uL (ref 150–400)
RBC: 4.6 MIL/uL (ref 3.87–5.11)
RDW: 12.5 % (ref 11.5–15.5)
WBC: 11 10*3/uL — ABNORMAL HIGH (ref 4.0–10.5)
nRBC: 0 % (ref 0.0–0.2)

## 2022-05-14 LAB — BASIC METABOLIC PANEL
Anion gap: 10 (ref 5–15)
BUN: 10 mg/dL (ref 8–23)
CO2: 22 mmol/L (ref 22–32)
Calcium: 8.4 mg/dL — ABNORMAL LOW (ref 8.9–10.3)
Chloride: 105 mmol/L (ref 98–111)
Creatinine, Ser: 0.75 mg/dL (ref 0.44–1.00)
GFR, Estimated: >60 mL/min (ref >60)
Glucose, Bld: 120 mg/dL — ABNORMAL HIGH (ref 70–99)
Potassium: 3.1 mmol/L — ABNORMAL LOW (ref 3.5–5.1)
Sodium: 137 mmol/L (ref 135–145)

## 2022-05-14 LAB — SARS CORONAVIRUS 2 BY RT PCR: SARS Coronavirus 2 by RT PCR: NEGATIVE

## 2022-05-14 MED ORDER — AZITHROMYCIN 250 MG PO TABS
ORAL_TABLET | ORAL | 0 refills | Status: AC
  Filled 2022-05-14: qty 6, 5d supply, fill #0

## 2022-05-14 MED ORDER — PREDNISONE 20 MG PO TABS
40.0000 mg | ORAL_TABLET | Freq: Every day | ORAL | 0 refills | Status: AC
  Filled 2022-05-14: qty 10, 5d supply, fill #0

## 2022-05-14 MED ORDER — ALBUTEROL SULFATE HFA 108 (90 BASE) MCG/ACT IN AERS
2.0000 | INHALATION_SPRAY | Freq: Four times a day (QID) | RESPIRATORY_TRACT | 0 refills | Status: DC | PRN
  Filled 2022-05-14: qty 6.7, 25d supply, fill #0

## 2022-05-14 MED ORDER — IPRATROPIUM-ALBUTEROL 0.5-2.5 (3) MG/3ML IN SOLN
3.0000 mL | Freq: Once | RESPIRATORY_TRACT | Status: AC
  Administered 2022-05-14: 3 mL via RESPIRATORY_TRACT
  Filled 2022-05-14: qty 3

## 2022-05-14 NOTE — ED Provider Notes (Signed)
[facility] Provider Note    Event Date/Time   First MD Initiated Contact with Patient 05/14/22 1249     (approximate)  History   Chief Complaint: Shortness of Breath  HPI  Kelly Wallace is a 72 y.o. female with a past medical history of anemia, arthritis, hypertension, presents to the emergency department for shortness of breath and cough.  According to the patient over the past 10 days or so she has had a frequent cough with occasional sputum production as well as feeling mildly short of breath.  Patient states subjective fever last week but does not believe she is having a fever currently.  Denies any chest pain.  Physical Exam   Triage Vital Signs: ED Triage Vitals  Enc Vitals Group     BP 05/14/22 1059 139/71     Pulse Rate 05/14/22 1059 90     Resp 05/14/22 1059 20     Temp 05/14/22 1059 98.3 F (36.8 C)     Temp Source 05/14/22 1059 Oral     SpO2 05/14/22 1059 100 %     Weight 05/14/22 1059 220 lb (99.8 kg)     Height 05/14/22 1059  (1.626 m)     Head Circumference --      Peak Flow --      Pain Score 05/14/22 1101 7     Pain Loc --      Pain Edu? --      Excl. in GC? --     Most recent vital signs: Vitals:   05/14/22 1059 05/14/22 1253  BP: 139/71 (!) 162/90  Pulse: 90 82  Resp: 20 (!) 23  Temp: 98.3 F (36.8 C)   SpO2: 100% 96%    General: Awake, no distress.  CV:  Good peripheral perfusion.  Regular rate and rhythm  Resp:  Normal effort.  Equal breath sounds bilaterally.  Minimal wheeze no rales or rhonchi. Abd:  No distention.  Soft, nontender.  No rebound or guarding. Other:  No significant lower extremity edema.   ED Results / Procedures / Treatments   EKG  EKG viewed and interpreted by myself shows a normal sinus rhythm at 92 bpm with a narrow QRS, normal axis, normal intervals, no concerning ST changes.  RADIOLOGY  I have reviewed and interpreted the chest x-ray images.  No obvious consolidation seen on my  evaluation. Radiology is read the x-ray as negative for acute abnormality.   MEDICATIONS ORDERED IN ED: Medications  ipratropium-albuterol (DUONEB) 0.5-2.5 (3) MG/3ML nebulizer solution 3 mL (has no administration in time range)     IMPRESSION / MDM / ASSESSMENT AND PLAN / ED COURSE  I reviewed the triage vital signs and the nursing notes.  Patient's presentation is most consistent with acute presentation with potential threat to life or bodily function.  Patient presents emergency department for 10 days of cough congestion mild shortness of breath.  Overall patient appears well, speaking in full sentences without issue.  Satting 96% on room air no baseline O2 requirement.  Afebrile.  Patient does have an occasional wet sounding cough.  Reassuringly normal CBC with a white blood cell count of 11, reassuring chemistry.  Chest x-ray shows no consolidation.  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  We will dose a DuoNeb and continue to closely monitor.  If the patient's COVID is negative anticipate likely discharge home with steroids and antibiotics to cover for acute bronchitis.  Patient agreeable to plan of care.  Patient's COVID test is negative.  Patient is feeling better after breathing treatment.  Will discharge with prednisone, Zithromax and an albuterol inhaler.  Patient agreeable to plan of care.  FINAL CLINICAL IMPRESSION(S) / ED DIAGNOSES   Acute bronchitis  Rx / DC Orders   Prednisone Zithromax  Note:  This document was prepared using Dragon voice recognition software and may include unintentional dictation errors.   Minna Antis, MD 05/14/22 1418

## 2022-05-14 NOTE — ED Triage Notes (Addendum)
Pt comes with c/o increased sob. Pt sob when walking and has labored breathing noted. Pt states this all started week ago. Pt states no cp. Pt states cough and cold symptoms.

## 2022-05-17 ENCOUNTER — Telehealth: Payer: Self-pay

## 2022-05-17 NOTE — Telephone Encounter (Signed)
     Patient  visit on 05/14/2022  at [facility] was for shortness of breath.  Have you been able to follow up with your primary care physician? Patient has upcoming appointment with her PCP.  The patient was or was not able to obtain any needed medicine or equipment. Patient was able to obtain medication.  Are there diet recommendations that you are having difficulty following? No  Patient expresses understanding of discharge instructions and education provided has no other needs at this time. Yes   Dakarai Mcglocklin [facility]  [facility] Community Resource Care Guide   [email or web address]  ?? [phone]   Website: [email or web address]  [email or web address]

## 2022-05-27 ENCOUNTER — Inpatient Hospital Stay: Payer: Medicare Other

## 2022-05-27 ENCOUNTER — Inpatient Hospital Stay: Payer: Medicare Other | Attending: Oncology

## 2022-05-27 DIAGNOSIS — E538 Deficiency of other specified B group vitamins: Secondary | ICD-10-CM | POA: Insufficient documentation

## 2022-05-27 DIAGNOSIS — E611 Iron deficiency: Secondary | ICD-10-CM

## 2022-05-27 MED ORDER — CYANOCOBALAMIN 1000 MCG/ML IJ SOLN
1000.0000 ug | Freq: Once | INTRAMUSCULAR | Status: AC
  Administered 2022-05-27: 1000 ug via INTRAMUSCULAR
  Filled 2022-05-27: qty 1

## 2022-05-28 ENCOUNTER — Ambulatory Visit
Admission: RE | Admit: 2022-05-28 | Discharge: 2022-05-28 | Disposition: A | Discharge status: Home / Self Care | Payer: Medicare Other | Source: Ambulatory Visit | Attending: Family Medicine | Admitting: Family Medicine

## 2022-05-28 DIAGNOSIS — Z780 Asymptomatic menopausal state: Secondary | ICD-10-CM | POA: Diagnosis not present

## 2022-05-28 DIAGNOSIS — Z1231 Encounter for screening mammogram for malignant neoplasm of breast: Secondary | ICD-10-CM | POA: Diagnosis not present

## 2022-05-28 DIAGNOSIS — M810 Age-related osteoporosis without current pathological fracture: Secondary | ICD-10-CM | POA: Diagnosis not present

## 2022-05-30 ENCOUNTER — Telehealth: Payer: Self-pay

## 2022-06-04 NOTE — Telephone Encounter (Signed)
error 

## 2022-06-27 ENCOUNTER — Inpatient Hospital Stay: Payer: Medicare Other

## 2022-06-27 ENCOUNTER — Inpatient Hospital Stay: Payer: Medicare Other | Attending: Oncology

## 2022-06-27 DIAGNOSIS — E611 Iron deficiency: Secondary | ICD-10-CM

## 2022-06-27 DIAGNOSIS — E538 Deficiency of other specified B group vitamins: Secondary | ICD-10-CM | POA: Diagnosis not present

## 2022-06-27 MED ORDER — CYANOCOBALAMIN 1000 MCG/ML IJ SOLN
1000.0000 ug | Freq: Once | INTRAMUSCULAR | Status: AC
  Administered 2022-06-27: 1000 ug via INTRAMUSCULAR
  Filled 2022-06-27: qty 1

## 2022-07-12 ENCOUNTER — Telehealth: Payer: Self-pay

## 2022-07-12 NOTE — Telephone Encounter (Signed)
FYI.  Hold paperwork for appt.

## 2022-07-12 NOTE — Telephone Encounter (Signed)
Per Dr. Lorin Picket paperwork will be discussed during her appointment 07/17/22.  Pt is aware and in agreement.

## 2022-07-17 ENCOUNTER — Encounter: Payer: Self-pay | Admitting: Internal Medicine

## 2022-07-17 ENCOUNTER — Ambulatory Visit (INDEPENDENT_AMBULATORY_CARE_PROVIDER_SITE_OTHER): Payer: Medicare Other | Admitting: Internal Medicine

## 2022-07-17 ENCOUNTER — Other Ambulatory Visit (INDEPENDENT_AMBULATORY_CARE_PROVIDER_SITE_OTHER): Payer: Medicare Other

## 2022-07-17 VITALS — BP 138/78 | HR 87 | Temp 97.9°F | Resp 16 | Ht 64.0 in | Wt 226.0 lb

## 2022-07-17 DIAGNOSIS — D649 Anemia, unspecified: Secondary | ICD-10-CM | POA: Diagnosis not present

## 2022-07-17 DIAGNOSIS — H9193 Unspecified hearing loss, bilateral: Secondary | ICD-10-CM | POA: Diagnosis not present

## 2022-07-17 DIAGNOSIS — M810 Age-related osteoporosis without current pathological fracture: Secondary | ICD-10-CM

## 2022-07-17 DIAGNOSIS — E559 Vitamin D deficiency, unspecified: Secondary | ICD-10-CM

## 2022-07-17 DIAGNOSIS — Z0000 Encounter for general adult medical examination without abnormal findings: Secondary | ICD-10-CM

## 2022-07-17 DIAGNOSIS — H9192 Unspecified hearing loss, left ear: Secondary | ICD-10-CM

## 2022-07-17 DIAGNOSIS — R0602 Shortness of breath: Secondary | ICD-10-CM | POA: Diagnosis not present

## 2022-07-17 DIAGNOSIS — D696 Thrombocytopenia, unspecified: Secondary | ICD-10-CM | POA: Diagnosis not present

## 2022-07-17 DIAGNOSIS — I10 Essential (primary) hypertension: Secondary | ICD-10-CM | POA: Diagnosis not present

## 2022-07-17 LAB — LIPID PANEL
Cholesterol: 102 mg/dL (ref 0–200)
HDL: 50.5 mg/dL (ref >39.00)
LDL Cholesterol: 41 mg/dL (ref 0–99)
NonHDL: 51.68
Total CHOL/HDL Ratio: 2
Triglycerides: 54 mg/dL (ref 0.0–149.0)
VLDL: 10.8 mg/dL (ref 0.0–40.0)

## 2022-07-17 LAB — HEPATIC FUNCTION PANEL
ALT: 14 U/L (ref 0–35)
AST: 18 U/L (ref 0–37)
Albumin: 4.1 g/dL (ref 3.5–5.2)
Alkaline Phosphatase: 142 U/L — ABNORMAL HIGH (ref 39–117)
Bilirubin, Direct: 0.1 mg/dL (ref 0.0–0.3)
Total Bilirubin: 0.5 mg/dL (ref 0.2–1.2)
Total Protein: 7.2 g/dL (ref 6.0–8.3)

## 2022-07-17 LAB — BASIC METABOLIC PANEL
BUN: 13 mg/dL (ref 6–23)
CO2: 26 mEq/L (ref 19–32)
Calcium: 9.3 mg/dL (ref 8.4–10.5)
Chloride: 105 mEq/L (ref 96–112)
Creatinine, Ser: 0.68 mg/dL (ref 0.40–1.20)
GFR: 88.42 mL/min (ref >60.00)
Glucose, Bld: 98 mg/dL (ref 70–99)
Potassium: 4.3 mEq/L (ref 3.5–5.1)
Sodium: 138 mEq/L (ref 135–145)

## 2022-07-17 LAB — TSH: TSH: 1.71 u[IU]/mL (ref 0.35–5.50)

## 2022-07-17 LAB — VITAMIN D 25 HYDROXY (VIT D DEFICIENCY, FRACTURES): VITD: 8.74 ng/mL — ABNORMAL LOW (ref 30.00–100.00)

## 2022-07-17 NOTE — Patient Instructions (Signed)
Calcium - oscal or caltrate - one tablet twice a day.

## 2022-07-17 NOTE — Progress Notes (Signed)
Subjective:    [name], female    DOB: November 30, 1950, 72 y.o.   MRN: [id number]   HPI Was scheduled for physical exam.  Had some other issues to discuss  and also has FLMA paperwork to be completed.  Appt changed to f/u appt. Continues on amlodipine for her blood pressure. Saw Dr Mariah Milling 08/2021 for evaluation - sob with exertion.  ECHO 11/2021 - EF 60-65% with G1DD. Mild to moderate regurgitation of tricuspid valve.  Mildly elevated right heart pressures.  Recommended lasix 20mg  three times per week with potassium.  Also noted borderline dilatation of the ascending aorta. Seeing Dr Smith Robert for iron deficient anemia.  Receiving B12 injections. Also had recent bone density.  Bone density - revealed osteoporosis.  Discussed osteoporosis.  Discussed possible treatment options, including oral bisphosphonates,, reclast and prolia.  No dysphagia.  No increased acid reflux.  Does report noticing some increased sob.  Had cardiac w/up as outlined.  Mild elevated right heart pressures.  Denies snoring.  Discussed pulmonary evaluation for further w/up.  Also reports decreased hearing.  Request further evaluation.  Was seen in ER 05/14/22 - diagnosed with persistent cough, congestion and sob.  Treated with prednisone, zpak and albuterol.  Had to be out of work during this time.  Needs FMLA paperwork completed.     Past Medical History:  Diagnosis Date   Anemia    not currently being treated for this   Arthritis    History of colon polyps    Hypertension    Past Surgical History:  Procedure Laterality Date   CHOLECYSTECTOMY  1989   COLONOSCOPY WITH PROPOFOL N/A 04/10/2017   Procedure: COLONOSCOPY WITH PROPOFOL;  Surgeon: Toney Reil, MD;  Location: [facility] ENDOSCOPY;  Service: Gastroenterology;  Laterality: N/A;   ESOPHAGOGASTRODUODENOSCOPY (EGD) WITH PROPOFOL N/A 04/10/2017   Procedure: ESOPHAGOGASTRODUODENOSCOPY (EGD) WITH PROPOFOL;  Surgeon: Toney Reil, MD;  Location: [facility]  ENDOSCOPY;  Service: Gastroenterology;  Laterality: N/A;   GASTRIC BYPASS  03/21/03   JOINT REPLACEMENT Right 04/2014   TOTAL KNEE REPLACEMENT, DR. Rosita Kea, [facility]   TOTAL KNEE ARTHROPLASTY Left 02/27/2016   Procedure: TOTAL KNEE ARTHROPLASTY;  Surgeon: Kennedy Bucker, MD;  Location: [facility] ORS;  Service: Orthopedics;  Laterality: Left;   TUBAL LIGATION  1976   Family History  Problem Relation Age of Onset   Hypertension Mother    Diabetes Mother    Heart disease Father    Cancer Maternal Aunt        question of type   Cancer Maternal Grandmother        colon   Breast cancer Cousin 61       maternal side   Social History   Socioeconomic History   Marital status: Married    Spouse name: Not on file   Number of children: 2   Years of education: Not on file   Highest education level: Not on file  Occupational History    Employer: [employer]  Tobacco Use   Smoking status: Never   Smokeless tobacco: Never  Vaping Use   Vaping Use: Never used  Substance and Sexual Activity   Alcohol use: No    Alcohol/week: 0.0 standard drinks of alcohol   Drug use: No   Sexual activity: Yes  Other Topics Concern   Not on file  Social History Narrative   Not on file   Social Determinants of Health   Financial Resource Strain: Low Risk  (04/05/2022)   Overall Financial  Resource Strain (CARDIA)    Difficulty of Paying Living Expenses: Not hard at all  Food Insecurity: No Food Insecurity (04/05/2022)   Hunger Vital Sign    Worried About Running Out of Food in the Last Year: Never true    Ran Out of Food in the Last Year: Never true  Transportation Needs: No Transportation Needs (04/05/2022)   PRAPARE - Administrator, Civil Service (Medical): No    Lack of Transportation (Non-Medical): No  Physical Activity: Sufficiently Active (04/05/2022)   Exercise Vital Sign    Days of Exercise per Week: 5 days    Minutes of Exercise per Session: 30 min  Stress: No Stress Concern Present (04/05/2022)    [employer] of Occupational Health - Occupational Stress Questionnaire    Feeling of Stress : Not at all  Social Connections: Unknown (04/05/2022)   Social Connection and Isolation Panel [NHANES]    Frequency of Communication with Friends and Family: More than three times a week    Frequency of Social Gatherings with Friends and Family: More than three times a week    Attends Religious Services: More than 4 times per year    Active Member of [employer] or Organizations: Yes    Attends Engineer, structural: More than 4 times per year    Marital Status: Not on file     Review of Systems  Constitutional:  Negative for appetite change and unexpected weight change.  HENT:  Negative for congestion and sinus pressure.   Respiratory:  Negative for cough and chest tightness.        Some sob with exertion.   Cardiovascular:  Negative for chest pain and palpitations.       No increased swelling.   Gastrointestinal:  Negative for abdominal pain, diarrhea, nausea and vomiting.  Genitourinary:  Negative for difficulty urinating and dysuria.  Musculoskeletal:  Negative for joint swelling and myalgias.  Skin:  Negative for color change and rash.  Neurological:  Negative for dizziness and headaches.  Psychiatric/Behavioral:  Negative for agitation and dysphoric mood.        Objective:     BP 138/78   Pulse 87   Temp 97.9 F (36.6 C)   Resp 16   Ht 5\' 4"  (1.626 m)   Wt 226 lb (102.5 kg)   LMP 04/06/2009   SpO2 99%   BMI 38.79 kg/m  Wt Readings from Last 3 Encounters:  07/17/22 226 lb (102.5 kg)  05/14/22 220 lb (99.8 kg)  04/05/22 232 lb (105.2 kg)    Physical Exam Vitals reviewed.  Constitutional:      General: She is not in acute distress.    Appearance: Normal appearance.  HENT:     Head: Normocephalic and atraumatic.     Right Ear: External ear normal.     Left Ear: External ear normal.  Eyes:     General: No scleral icterus.       Right eye: No discharge.         Left eye: No discharge.     Conjunctiva/sclera: Conjunctivae normal.  Neck:     Thyroid: No thyromegaly.  Cardiovascular:     Rate and Rhythm: Normal rate and regular rhythm.  Pulmonary:     Effort: No respiratory distress.     Breath sounds: Normal breath sounds. No wheezing.  Abdominal:     General: Bowel sounds are normal.     Palpations: Abdomen is soft.     Tenderness: There  is no abdominal tenderness.  Musculoskeletal:        General: No swelling or tenderness.     Cervical back: Neck supple. No tenderness.  Lymphadenopathy:     Cervical: No cervical adenopathy.  Skin:    Findings: No erythema or rash.  Neurological:     Mental Status: She is alert.  Psychiatric:        Mood and Affect: Mood normal.        Behavior: Behavior normal.      Outpatient Encounter Medications as of 07/17/2022  Medication Sig   alendronate (FOSAMAX) 70 MG tablet Take 1 tablet (70 mg total) by mouth every 7 (seven) days. Take with a full glass of water on an empty stomach.   Vitamin D, Ergocalciferol, (DRISDOL) 1.25 MG (50000 UNIT) CAPS capsule Take 1 capsule (50,000 Units total) by mouth every 7 (seven) days.   albuterol (VENTOLIN HFA) 108 (90 Base) MCG/ACT inhaler Inhale 2 puffs into the lungs every 6 (six) hours as needed for wheezing or shortness of breath.   amLODipine (NORVASC) 5 MG tablet Take 1 tablet (5 mg total) by mouth daily.   furosemide (LASIX) 20 MG tablet Take 1 tablet (20 mg total) by mouth every Monday, Wednesday, and Friday.   polyethylene glycol powder (GLYCOLAX/MIRALAX) 17 GM/SCOOP powder Take 17 g by mouth daily. PRN   potassium chloride (KLOR-CON) 10 MEQ tablet Take 1 tablet (10 mEq total) by mouth every Monday, Wednesday, and Friday.   No facility-administered encounter medications on file as of 07/17/2022.     Lab Results  Component Value Date   WBC 11.0 (H) 05/14/2022   HGB 13.6 05/14/2022   HCT 41.4 05/14/2022   PLT 278 05/14/2022   GLUCOSE 98 07/17/2022    CHOL 102 07/17/2022   TRIG 54.0 07/17/2022   HDL 50.50 07/17/2022   LDLCALC 41 07/17/2022   ALT 14 07/17/2022   AST 18 07/17/2022   NA 138 07/17/2022   K 4.3 07/17/2022   CL 105 07/17/2022   CREATININE 0.68 07/17/2022   BUN 13 07/17/2022   CO2 26 07/17/2022   TSH 1.71 07/17/2022   INR 1.14 02/21/2016   HGBA1C 5.6 03/25/2019    Mammogram 3D SCREEN BREAST BILATERAL  Result Date: 05/29/2022 CLINICAL DATA:  Screening. EXAM: DIGITAL SCREENING BILATERAL MAMMOGRAM WITH TOMOSYNTHESIS AND CAD TECHNIQUE: Bilateral screening digital craniocaudal and mediolateral oblique mammograms were obtained. Bilateral screening digital breast tomosynthesis was performed. The images were evaluated with computer-aided detection. COMPARISON:  Previous exam(s). ACR Breast Density Category a: The breasts are almost entirely fatty. FINDINGS: There are no findings suspicious for malignancy. IMPRESSION: No mammographic evidence of malignancy. A result letter of this screening mammogram will be mailed directly to the patient. RECOMMENDATION: Screening mammogram in one year. (Code:SM-B-01Y) BI-RADS CATEGORY  1: Negative. Electronically Signed   By: Hulan Saas M.D.   On: 05/29/2022 13:07   DEXAScan  Result Date: 05/28/2022 EXAM: DUAL X-RAY ABSORPTIOMETRY (DXA) FOR BONE MINERAL DENSITY IMPRESSION: Your patient Kelly Wallace completed a BMD test on 05/28/2022 using the [employer] iDXA DXA System (software version: 14.10) manufactured by [employer]. The following summarizes the results of our evaluation. Technologist:VLM PATIENT BIOGRAPHICAL: Name: Amarra, Aeschliman Patient ID: [id number] Birth Date: 1950-07-16 Height: 64.0 in. Gender: Female Exam Date: 05/28/2022 Weight: 220.0 lbs. Indications: Postmenopausal Fractures: Treatments: Albuterol DENSITOMETRY RESULTS: Site      Region    Measured Date Measured Age WHO Classification Young Adult T-score BMD         %  Change vs. Previous Significant Change (*) AP Spine L1-L4  05/28/2024 69.9 Normal -0.4 1.147 g/cm2 - - DualFemur Neck Left 05/28/2022 69.9 Osteoporosis -2.5 0.690 g/cm2 - - ASSESSMENT: The BMD measured at Femur Neck Left is 0.690 g/cm2 with a T-score of -2.5. This patient is considered osteoporotic according to World Health Organization [facility]) criteria. The scan quality is good. World Science writer [facility]) criteria for post-menopausal, Caucasian Women: Normal:                   T-score at or above -1 SD Osteopenia/low bone mass: T-score between -1 and -2.5 SD Osteoporosis:             T-score at or below -2.5 SD RECOMMENDATIONS: 1. All patients should optimize calcium and vitamin D intake. 2. Consider FDA-approved medical therapies in postmenopausal women and men aged 14 years and older, based on the following: a. A hip or vertebral(clinical or morphometric) fracture b. T-score < -2.5 at the femoral neck or spine after appropriate evaluation to exclude secondary causes c. Low bone mass (T-score between -1.0 and -2.5 at the femoral neck or spine) and a 10-year probability of a hip fracture > 3% or a 10-year probability of a major osteoporosis-related fracture > 20% based on the [place]-adapted WHO algorithm 3. Clinician judgment and/or patient preferences may indicate treatment for people with 10-year fracture probabilities above or below these levels FOLLOW-UP: People with diagnosed cases of osteoporosis or at high risk for fracture should have regular bone mineral density tests. For patients eligible for Medicare, routine testing is allowed once every 2 years. The testing frequency can be increased to one year for patients who have rapidly progressing disease, those who are receiving or discontinuing medical therapy to restore bone mass, or have additional risk factors. I have reviewed this report, and agree with the above findings. [facility] Electronically Signed   By: Frederico Hamman M.D.   On: 05/28/2022 10:08       Assessment & Plan:  Anemia,  unspecified type Assessment & Plan: Has seen GI.  Being followed by hematology.  Has had iron infusions previously.  Follow cbc.     Essential hypertension, benign Assessment & Plan: On amlodipine.  Blood pressure as outlined.  Has not been taking regularly.  Discussed importance of taking her blood pressure medication on a regular basis.  Hold on changing medication.  Follow metabolic panel.  Follow pressures.    Orders: -     TSH  Vitamin D deficiency Assessment & Plan: Check vitamin D level.   Orders: -     VITAMIN D 25 Hydroxy (Vit-D Deficiency, Fractures)  SOB (shortness of breath) on exertion Assessment & Plan:  Saw Dr Mariah Milling 08/2021 for evaluation - sob with exertion.  ECHO 11/2021 - EF 60-65% with G1DD. Mild to moderate regurgitation of tricuspid valve.  Mildly elevated right heart pressures.  Recommended lasix 20mg  three times per week with potassium. Given echo findings and given persistent sob on exertion, discussed further pulmonary w/up.  Agreeable for referral.  Denies sleep issues.    Orders: -     TSH -     Ambulatory referral to Pulmonology  Health care maintenance Assessment & Plan: Physical not performed today as outlined. Colonoscopy 04/10/17 - tubular adenomatous polyp (transverse colon).  Recommended f/u in 5 years - Dr Allegra Lai.  Due. Mammogram 05/28/22 - Birads I.    Decreased hearing of both ears -     Ambulatory referral to ENT  Hearing loss of left ear, unspecified hearing loss type Assessment & Plan: Discussed.  Decreased hearing.  Requested ENT evaluation.    Thrombocytopenia (HCC) Assessment & Plan: Check cbc.    Osteoporosis without current pathological fracture, unspecified osteoporosis type Assessment & Plan: Bone density revealed osteoporosis.  Discussed osteoporosis.  Discussed possible treatment options, including oral bisphosphonates,, reclast and prolia.  No dysphagia.  No increased acid reflux.  Agreed to start fosamax.  Follow.     Other orders -     Vitamin D (Ergocalciferol); Take 1 capsule (50,000 Units total) by mouth every 7 (seven) days.  Dispense: 12 capsule; Refill: 1 -     Alendronate Sodium; Take 1 tablet (70 mg total) by mouth every 7 (seven) days. Take with a full glass of water on an empty stomach.  Dispense: 12 tablet; Refill: 3     [place], MD

## 2022-07-17 NOTE — Assessment & Plan Note (Addendum)
Physical not performed today as outlined. Colonoscopy 04/10/17 - tubular adenomatous polyp (transverse colon).  Recommended f/u in 5 years - Dr Allegra Lai.  Due. Mammogram 05/28/22 - Birads I.

## 2022-07-19 ENCOUNTER — Other Ambulatory Visit: Payer: Self-pay

## 2022-07-19 MED ORDER — VITAMIN D (ERGOCALCIFEROL) 1.25 MG (50000 UNIT) PO CAPS
50000.0000 [IU] | ORAL_CAPSULE | ORAL | 1 refills | Status: DC
  Filled 2022-07-19 – 2022-09-16 (×3): qty 12, 84d supply, fill #0

## 2022-07-21 ENCOUNTER — Other Ambulatory Visit: Payer: Self-pay

## 2022-07-21 DIAGNOSIS — M810 Age-related osteoporosis without current pathological fracture: Secondary | ICD-10-CM | POA: Insufficient documentation

## 2022-07-21 MED ORDER — ALENDRONATE SODIUM 70 MG PO TABS
70.0000 mg | ORAL_TABLET | ORAL | 3 refills | Status: DC
  Filled 2022-07-21 – 2022-09-16 (×4): qty 12, 84d supply, fill #0
  Filled 2023-02-27: qty 12, 84d supply, fill #1

## 2022-07-21 NOTE — Assessment & Plan Note (Signed)
Check cbc 

## 2022-07-21 NOTE — Assessment & Plan Note (Signed)
Bone density revealed osteoporosis.  Discussed osteoporosis.  Discussed possible treatment options, including oral bisphosphonates,, reclast and prolia.  No dysphagia.  No increased acid reflux.  Agreed to start fosamax.  Follow.

## 2022-07-21 NOTE — Assessment & Plan Note (Signed)
Check vitamin D level 

## 2022-07-21 NOTE — Assessment & Plan Note (Signed)
Saw Dr Mariah Milling 08/2021 for evaluation - sob with exertion.  ECHO 11/2021 - EF 60-65% with G1DD. Mild to moderate regurgitation of tricuspid valve.  Mildly elevated right heart pressures.  Recommended lasix 20mg  three times per week with potassium. Given echo findings and given persistent sob on exertion, discussed further pulmonary w/up.  Agreeable for referral.  Denies sleep issues.

## 2022-07-21 NOTE — Assessment & Plan Note (Signed)
On amlodipine.  Blood pressure as outlined.  Has not been taking regularly.  Discussed importance of taking her blood pressure medication on a regular basis.  Hold on changing medication.  Follow metabolic panel.  Follow pressures.

## 2022-07-21 NOTE — Assessment & Plan Note (Signed)
Has seen GI.  Being followed by hematology.  Has had iron infusions previously.  Follow cbc.   

## 2022-07-21 NOTE — Assessment & Plan Note (Signed)
Discussed.  Decreased hearing.  Requested ENT evaluation.

## 2022-07-26 ENCOUNTER — Inpatient Hospital Stay: Payer: Medicare Other

## 2022-07-29 ENCOUNTER — Inpatient Hospital Stay: Payer: Medicare Other | Attending: Oncology

## 2022-07-29 DIAGNOSIS — E611 Iron deficiency: Secondary | ICD-10-CM

## 2022-07-29 DIAGNOSIS — E538 Deficiency of other specified B group vitamins: Secondary | ICD-10-CM | POA: Diagnosis not present

## 2022-07-29 MED ORDER — CYANOCOBALAMIN 1000 MCG/ML IJ SOLN
1000.0000 ug | Freq: Once | INTRAMUSCULAR | Status: AC
  Administered 2022-07-29: 1000 ug via INTRAMUSCULAR
  Filled 2022-07-29: qty 1

## 2022-08-02 ENCOUNTER — Other Ambulatory Visit: Payer: Self-pay

## 2022-08-23 DIAGNOSIS — H90A31 Mixed conductive and sensorineural hearing loss, unilateral, right ear with restricted hearing on the contralateral side: Secondary | ICD-10-CM | POA: Diagnosis not present

## 2022-08-26 ENCOUNTER — Inpatient Hospital Stay: Payer: Medicare Other

## 2022-08-26 ENCOUNTER — Ambulatory Visit (INDEPENDENT_AMBULATORY_CARE_PROVIDER_SITE_OTHER): Payer: Medicare Other | Admitting: Pulmonary Disease

## 2022-08-26 VITALS — BP 132/70 | HR 77 | Temp 97.8°F | Ht 64.0 in | Wt 225.0 lb

## 2022-08-26 DIAGNOSIS — R0602 Shortness of breath: Secondary | ICD-10-CM | POA: Diagnosis not present

## 2022-08-26 NOTE — Progress Notes (Unsigned)
Synopsis: Referred in by Dale [place], MD   Subjective:   [name]: female DOB: 1950/12/30, MRN: [id number]  Chief Complaint  Patient presents with   pulmonary consult    SOB with exertion and occ dry cough.     HPI Kelly Wallace is a pleasant 72 y.o female patient with a past medical history of hypertension, HFpEF, morbid obesity, presenting to the  pulmonary clinic for exertional shortness of breath.   She reports that its been going on for years and gradually worsening. She denies any chest wheezing or tightness. Does report dry cough. Denies any hemoptysis or chest pain. Denies any weight changes and GERD symptoms. She was prescribed an albuterol inhaler for an event of acute bronchitis iso viral infection and has been using it on and off with some relief.   FH: Mother with Diabetes and Father was a smoker with COPD.   SH:  Never smoker, denies alcohol use and illicit drug use. She works here at [employer] no environmental exposures. Has 2 dogs and 1 cat at home no birds.   ROS All systems were reviewed and are negative except for the above.   Objective:   Vitals:   08/26/22 1138  BP: 132/70  Pulse: 77  Temp: 97.8 F (36.6 C)  TempSrc: Temporal  SpO2: 100%  Weight: 225 lb (102.1 kg)  Height: 5\' 4"  (1.626 m)   100% on RA BMI Readings from Last 3 Encounters:  08/26/22 38.62 kg/m  07/17/22 38.79 kg/m  05/14/22 37.76 kg/m   Wt Readings from Last 3 Encounters:  08/26/22 225 lb (102.1 kg)  07/17/22 226 lb (102.5 kg)  05/14/22 220 lb (99.8 kg)    Physical Exam GEN: NAD HEENT: Supple Neck, Reactive Pupils, EOMI  CVS: Normal S1, Normal S2, RRR, No murmurs or ES appreciated  Lungs: Diminished breath sounds at the bases with faint crackles. No wheezing.  Abdomen: Soft, non tender, non distended, + BS  Extremities: Warm and well perfused, trace edema  Skin: No suspicious lesions appreciated  Psych: Normal Affect  Ancillary Information    CBC    Component Value Date/Time   WBC 11.0 (H) 05/14/2022 1100   RBC 4.60 05/14/2022 1100   HGB 13.6 05/14/2022 1100   HGB 10.6 (L) 05/07/2014 0350   HCT 41.4 05/14/2022 1100   HCT 35.9 04/20/2014 0837   PLT 278 05/14/2022 1100   PLT 172 05/06/2014 0711   MCV 90.0 05/14/2022 1100   MCV 89 04/20/2014 0837   MCH 29.6 05/14/2022 1100   MCHC 32.9 05/14/2022 1100   RDW 12.5 05/14/2022 1100   RDW 14.8 (H) 04/20/2014 0837   LYMPHSABS 2.4 04/26/2022 1300   MONOABS 0.4 04/26/2022 1300   EOSABS 0.2 04/26/2022 1300   BASOSABS 0.0 04/26/2022 1300    Imaging  CXR 05/14/2022: mild increased lung markings   Echocardiogram 11/2021: Normal LV function, Grade 1 diastolic dysfunction with RVSP 39.3 mmHg.       No data to display           Assessment & Plan:  Kelly Wallace is a pleasant 72 y.o female patient with a past medical history of hypertension, HFpEF, morbid obesity, presenting to the  pulmonary clinic for exertional shortness of breath.   #Shortness of breath  #Morbid Obesity  #Grade 1 Diastolic dysfunction on echocardiogram 11/2021  Likely multifactorial in the setting of obesity and HFpEF. Possibly parenchymal lung disease as seen with reticular markings on CXR. Obnstructive disease less  likely as she is a never smoker.  []  Schedule PFTs.  []  CT chest wo Contrast to evaluate for parenchymal lung disease.  []  Continue with Albuterol as needed pending above results.  []  Advised on weight loss []  Diuretic therapy per cardiology   Return in about 3 months (around 11/26/2022).  I spent 40 minutes caring for this patient today, including {EM billing:[id number]}  Janann Colonel, MD [facility] 08/26/2022 12:00 PM

## 2022-08-27 ENCOUNTER — Inpatient Hospital Stay: Payer: Medicare Other | Attending: Oncology

## 2022-08-27 DIAGNOSIS — E611 Iron deficiency: Secondary | ICD-10-CM

## 2022-08-27 DIAGNOSIS — E538 Deficiency of other specified B group vitamins: Secondary | ICD-10-CM | POA: Diagnosis not present

## 2022-08-27 MED ORDER — CYANOCOBALAMIN 1000 MCG/ML IJ SOLN
1000.0000 ug | Freq: Once | INTRAMUSCULAR | Status: AC
  Administered 2022-08-27: 1000 ug via INTRAMUSCULAR
  Filled 2022-08-27: qty 1

## 2022-08-30 ENCOUNTER — Ambulatory Visit
Admission: RE | Admit: 2022-08-30 | Discharge: 2022-08-30 | Disposition: A | Discharge status: Home / Self Care | Payer: Medicare Other | Source: Ambulatory Visit | Attending: Pulmonary Disease | Admitting: Pulmonary Disease

## 2022-08-30 ENCOUNTER — Other Ambulatory Visit: Payer: Self-pay

## 2022-08-30 DIAGNOSIS — R918 Other nonspecific abnormal finding of lung field: Secondary | ICD-10-CM | POA: Diagnosis not present

## 2022-08-30 DIAGNOSIS — R0602 Shortness of breath: Secondary | ICD-10-CM | POA: Insufficient documentation

## 2022-08-30 DIAGNOSIS — H722X1 Other marginal perforations of tympanic membrane, right ear: Secondary | ICD-10-CM | POA: Diagnosis not present

## 2022-08-30 DIAGNOSIS — I77810 Thoracic aortic ectasia: Secondary | ICD-10-CM | POA: Diagnosis not present

## 2022-08-30 DIAGNOSIS — H90A31 Mixed conductive and sensorineural hearing loss, unilateral, right ear with restricted hearing on the contralateral side: Secondary | ICD-10-CM | POA: Diagnosis not present

## 2022-08-30 DIAGNOSIS — R590 Localized enlarged lymph nodes: Secondary | ICD-10-CM | POA: Diagnosis not present

## 2022-09-06 ENCOUNTER — Other Ambulatory Visit: Payer: Self-pay

## 2022-09-11 ENCOUNTER — Encounter: Payer: Medicare Other | Admitting: Internal Medicine

## 2022-09-16 ENCOUNTER — Other Ambulatory Visit: Payer: Self-pay

## 2022-09-16 ENCOUNTER — Other Ambulatory Visit: Payer: Self-pay | Admitting: Internal Medicine

## 2022-09-16 MED FILL — Amlodipine Besylate Tab 5 MG (Base Equivalent): ORAL | 90 days supply | Qty: 90 | Fill #0 | Status: CN

## 2022-09-24 ENCOUNTER — Other Ambulatory Visit: Payer: Self-pay

## 2022-09-26 ENCOUNTER — Inpatient Hospital Stay: Payer: Medicare Other | Attending: Oncology

## 2022-09-26 ENCOUNTER — Inpatient Hospital Stay: Payer: Medicare Other

## 2022-09-26 DIAGNOSIS — E538 Deficiency of other specified B group vitamins: Secondary | ICD-10-CM | POA: Insufficient documentation

## 2022-09-26 DIAGNOSIS — E611 Iron deficiency: Secondary | ICD-10-CM

## 2022-09-26 MED ORDER — CYANOCOBALAMIN 1000 MCG/ML IJ SOLN
1000.0000 ug | Freq: Once | INTRAMUSCULAR | Status: AC
  Administered 2022-09-26: 1000 ug via INTRAMUSCULAR
  Filled 2022-09-26: qty 1

## 2022-10-26 ENCOUNTER — Other Ambulatory Visit: Payer: Self-pay | Admitting: *Deleted

## 2022-10-26 DIAGNOSIS — E611 Iron deficiency: Secondary | ICD-10-CM

## 2022-10-26 DIAGNOSIS — E538 Deficiency of other specified B group vitamins: Secondary | ICD-10-CM

## 2022-10-28 ENCOUNTER — Inpatient Hospital Stay: Payer: Medicare Other | Attending: Oncology

## 2022-10-28 ENCOUNTER — Inpatient Hospital Stay: Payer: Medicare Other

## 2022-10-28 ENCOUNTER — Encounter: Payer: Self-pay | Admitting: Oncology

## 2022-10-28 ENCOUNTER — Inpatient Hospital Stay (HOSPITAL_BASED_OUTPATIENT_CLINIC_OR_DEPARTMENT_OTHER): Payer: Medicare Other | Admitting: Oncology

## 2022-10-28 VITALS — BP 141/83 | HR 70 | Temp 96.5°F | Resp 16 | Ht 63.0 in | Wt 217.7 lb

## 2022-10-28 DIAGNOSIS — D509 Iron deficiency anemia, unspecified: Secondary | ICD-10-CM | POA: Insufficient documentation

## 2022-10-28 DIAGNOSIS — Z8249 Family history of ischemic heart disease and other diseases of the circulatory system: Secondary | ICD-10-CM | POA: Insufficient documentation

## 2022-10-28 DIAGNOSIS — Z800 Family history of malignant neoplasm of digestive organs: Secondary | ICD-10-CM | POA: Diagnosis not present

## 2022-10-28 DIAGNOSIS — E538 Deficiency of other specified B group vitamins: Secondary | ICD-10-CM

## 2022-10-28 DIAGNOSIS — Z803 Family history of malignant neoplasm of breast: Secondary | ICD-10-CM | POA: Diagnosis not present

## 2022-10-28 DIAGNOSIS — E611 Iron deficiency: Secondary | ICD-10-CM | POA: Diagnosis not present

## 2022-10-28 DIAGNOSIS — R5383 Other fatigue: Secondary | ICD-10-CM | POA: Insufficient documentation

## 2022-10-28 DIAGNOSIS — Z809 Family history of malignant neoplasm, unspecified: Secondary | ICD-10-CM | POA: Diagnosis not present

## 2022-10-28 DIAGNOSIS — Z9049 Acquired absence of other specified parts of digestive tract: Secondary | ICD-10-CM | POA: Diagnosis not present

## 2022-10-28 DIAGNOSIS — Z860100 Personal history of colon polyps, unspecified: Secondary | ICD-10-CM | POA: Diagnosis not present

## 2022-10-28 DIAGNOSIS — Z79899 Other long term (current) drug therapy: Secondary | ICD-10-CM | POA: Insufficient documentation

## 2022-10-28 DIAGNOSIS — Z9884 Bariatric surgery status: Secondary | ICD-10-CM | POA: Insufficient documentation

## 2022-10-28 DIAGNOSIS — I10 Essential (primary) hypertension: Secondary | ICD-10-CM | POA: Diagnosis not present

## 2022-10-28 DIAGNOSIS — Z833 Family history of diabetes mellitus: Secondary | ICD-10-CM | POA: Diagnosis not present

## 2022-10-28 DIAGNOSIS — R0602 Shortness of breath: Secondary | ICD-10-CM | POA: Diagnosis not present

## 2022-10-28 LAB — CBC
HCT: 40.7 % (ref 36.0–46.0)
Hemoglobin: 13.1 g/dL (ref 12.0–15.0)
MCH: 30.2 pg (ref 26.0–34.0)
MCHC: 32.2 g/dL (ref 30.0–36.0)
MCV: 93.8 fL (ref 80.0–100.0)
Platelets: 170 10*3/uL (ref 150–400)
RBC: 4.34 MIL/uL (ref 3.87–5.11)
RDW: 13.2 % (ref 11.5–15.5)
WBC: 5.4 10*3/uL (ref 4.0–10.5)
nRBC: 0 % (ref 0.0–0.2)

## 2022-10-28 LAB — IRON AND TIBC
Iron: 99 ug/dL (ref 28–170)
Saturation Ratios: 29 % (ref 10.4–31.8)
TIBC: 342 ug/dL (ref 250–450)
UIBC: 243 ug/dL

## 2022-10-28 LAB — FERRITIN: Ferritin: 72 ng/mL (ref 11–307)

## 2022-10-28 MED ORDER — CYANOCOBALAMIN 1000 MCG/ML IJ SOLN
1000.0000 ug | Freq: Once | INTRAMUSCULAR | Status: AC
  Administered 2022-10-28: 1000 ug via INTRAMUSCULAR
  Filled 2022-10-28: qty 1

## 2022-11-03 ENCOUNTER — Encounter: Payer: Self-pay | Admitting: Oncology

## 2022-11-03 NOTE — Progress Notes (Signed)
Hematology/Oncology Consult note [facility]  [phone] [phone]  Patient Care Team: Dale [place], MD as PCP - General (Internal Medicine) Creig Hines, MD as Consulting Physician (Oncology)   Name of the patient: Kelly Wallace  [id number]  September 14, 1950   Date of visit: 11/03/22  Diagnosis-history of iron deficiency anemia  Chief complaint/ Reason for visit-routine follow-up of iron deficiency anemia  Heme/Onc history: patient is a 72 year old African-American female  who has been referred to [place] for evaluation and management of anemia.  She has a history of gastric bypass surgery back in 2005.  She is not currently taking any iron supplements.     Recent CBC from 02/26/2017 showed white count of 5, H&H of 9.5/30.4 with an MCV of 78.7 and a platelet count of 232.  Ferritin levels were low at 4.9.  Of note ferritin was low at 7.45 months ago as well.  B12 levels were low at 204.  TSH was normal.  On review of her prior CBCs her hemoglobin has been between 9-10 for the last 1 year.  Borderline low MCV levels.  No other cytopenias.  Last colonoscopy in June 2013 showed internal hemorrhoids but no other abnormal findings. pateint had repeat EGD and colonoscopy inmarch 2019 which did not reveal any bleeding.s he received 2 doses of feraheme in march 2019    Interval history- patient has baseline fatigue and exertional shortness of breath which is unchanged.  Denies any other ne complaints at this time  ECOG PS- 1 Pain scale- 0   Review of systems- Review of Systems  Constitutional:  Positive for malaise/fatigue.  Respiratory:  Positive for shortness of breath.       No Known Allergies   Past Medical History:  Diagnosis Date   Anemia    not currently being treated for this   Arthritis    History of colon polyps    Hypertension      Past Surgical History:  Procedure Laterality Date   CHOLECYSTECTOMY  1989   COLONOSCOPY WITH  PROPOFOL N/A 04/10/2017   Procedure: COLONOSCOPY WITH PROPOFOL;  Surgeon: Toney Reil, MD;  Location: [facility] ENDOSCOPY;  Service: Gastroenterology;  Laterality: N/A;   ESOPHAGOGASTRODUODENOSCOPY (EGD) WITH PROPOFOL N/A 04/10/2017   Procedure: ESOPHAGOGASTRODUODENOSCOPY (EGD) WITH PROPOFOL;  Surgeon: Toney Reil, MD;  Location: [facility] ENDOSCOPY;  Service: Gastroenterology;  Laterality: N/A;   GASTRIC BYPASS  03/21/03   JOINT REPLACEMENT Right 04/2014   TOTAL KNEE REPLACEMENT, DR. Rosita Kea, [facility]   TOTAL KNEE ARTHROPLASTY Left 02/27/2016   Procedure: TOTAL KNEE ARTHROPLASTY;  Surgeon: Kennedy Bucker, MD;  Location: [facility] ORS;  Service: Orthopedics;  Laterality: Left;   TUBAL LIGATION  1976    Social History   Socioeconomic History   Marital status: Married    Spouse name: Not on file   Number of children: 2   Years of education: Not on file   Highest education level: Not on file  Occupational History    Employer: [employer]  Tobacco Use   Smoking status: Never   Smokeless tobacco: Never  Vaping Use   Vaping status: Never Used  Substance and Sexual Activity   Alcohol use: No    Alcohol/week: 0.0 standard drinks of alcohol   Drug use: No   Sexual activity: Yes  Other Topics Concern   Not on file  Social History Narrative   Not on file   Social Determinants of Health   Financial Resource Strain: Low Risk  (  04/05/2022)   Overall Financial Resource Strain (CARDIA)    Difficulty of Paying Living Expenses: Not hard at all  Food Insecurity: No Food Insecurity (04/05/2022)   Hunger Vital Sign    Worried About Running Out of Food in the Last Year: Never true    Ran Out of Food in the Last Year: Never true  Transportation Needs: No Transportation Needs (04/05/2022)   PRAPARE - Administrator, Civil Service (Medical): No    Lack of Transportation (Non-Medical): No  Physical Activity: Sufficiently Active (04/05/2022)   Exercise Vital Sign    Days of Exercise per Week: 5 days     Minutes of Exercise per Session: 30 min  Stress: No Stress Concern Present (04/05/2022)   [employer] of Occupational Health - Occupational Stress Questionnaire    Feeling of Stress : Not at all  Social Connections: Unknown (04/05/2022)   Social Connection and Isolation Panel [NHANES]    Frequency of Communication with Friends and Family: More than three times a week    Frequency of Social Gatherings with Friends and Family: More than three times a week    Attends Religious Services: More than 4 times per year    Active Member of [employer] or Organizations: Yes    Attends Banker Meetings: More than 4 times per year    Marital Status: Not on file  Intimate Partner Violence: Not At Risk (04/05/2022)   Humiliation, Afraid, Rape, and Kick questionnaire    Fear of Current or Ex-Partner: No    Emotionally Abused: No    Physically Abused: No    Sexually Abused: No    Family History  Problem Relation Age of Onset   Hypertension Mother    Diabetes Mother    Heart disease Father    Cancer Maternal Aunt        question of type   Cancer Maternal Grandmother        colon   Breast cancer Cousin 28       maternal side     Current Outpatient Medications:    albuterol (VENTOLIN HFA) 108 (90 Base) MCG/ACT inhaler, Inhale 2 puffs into the lungs every 6 (six) hours as needed for wheezing or shortness of breath., Disp: 8 g, Rfl: 0   alendronate (FOSAMAX) 70 MG tablet, Take 1 tablet (70 mg total) by mouth every 7 (seven) days. Take with a full glass of water on an empty stomach., Disp: 12 tablet, Rfl: 3   amLODipine (NORVASC) 5 MG tablet, Take 1 tablet (5 mg total) by mouth daily., Disp: 90 tablet, Rfl: 1   furosemide (LASIX) 20 MG tablet, Take 1 tablet (20 mg total) by mouth every Monday, Wednesday, and Friday., Disp: 36 tablet, Rfl: 3   polyethylene glycol powder (GLYCOLAX/MIRALAX) 17 GM/SCOOP powder, Take 17 g by mouth daily. PRN, Disp: , Rfl:    potassium chloride (KLOR-CON) 10 MEQ  tablet, Take 1 tablet (10 mEq total) by mouth every Monday, Wednesday, and Friday., Disp: 36 tablet, Rfl: 3   Vitamin D, Ergocalciferol, (DRISDOL) 1.25 MG (50000 UNIT) CAPS capsule, Take 1 capsule (50,000 Units total) by mouth every 7 (seven) days., Disp: 12 capsule, Rfl: 1  Physical exam:  Vitals:   10/28/22 1134  BP: (!) 141/83  Pulse: 70  Resp: 16  Temp: (!) 96.5 F (35.8 C)  TempSrc: Tympanic  SpO2: 95%  Weight: 217 lb 11.2 oz (98.7 kg)  Height: 5\' 3"  (1.6 m)   Physical Exam Cardiovascular:  Rate and Rhythm: Normal rate and regular rhythm.     Heart sounds: Normal heart sounds.  Pulmonary:     Effort: Pulmonary effort is normal.     Breath sounds: Normal breath sounds.  Abdominal:     General: Bowel sounds are normal.     Palpations: Abdomen is soft.  Skin:    General: Skin is warm and dry.  Neurological:     Mental Status: She is alert and oriented to person, place, and time.         Latest Ref Rng & Units 07/17/2022    8:42 AM  CMP  Glucose 70 - 99 mg/dL 98   BUN 6 - 23 mg/dL 13   Creatinine 4.09 - 1.20 mg/dL 8.11   Sodium 914 - 782 mEq/L 138   Potassium 3.5 - 5.1 mEq/L 4.3   Chloride 96 - 112 mEq/L 105   CO2 19 - 32 mEq/L 26   Calcium 8.4 - 10.5 mg/dL 9.3   Total Protein 6.0 - 8.3 g/dL 7.2   Total Bilirubin 0.2 - 1.2 mg/dL 0.5   Alkaline Phos 39 - 117 U/L 142   AST 0 - 37 U/L 18   ALT 0 - 35 U/L 14       Latest Ref Rng & Units 10/28/2022   10:32 AM  CBC  WBC 4.0 - 10.5 K/uL 5.4   Hemoglobin 12.0 - 15.0 g/dL 95.6   Hematocrit 21.3 - 46.0 % 40.7   Platelets 150 - 400 K/uL 170       Assessment and plan- Patient is a 72 y.o. female for routine follow-up of iron and B12 deficiency anemia    Patient is not currently anemic with an H&H of 13.1/40.7.  Her iron studies are within normal limits with a ferritin level of 72 and iron saturation of 29%.  She has not required any IV iron over 5 years now.  Patient is coming to the cancer center for monthly  B12 injections and wishes to continue that over here instead of self administration.  I will see her back in 1 year with CBC ferritin and iron studies   Visit Diagnosis 1. Iron deficiency   2. B12 deficiency      Dr. Owens Shark, MD, MPH [facility] [phone] 11/03/2022 5:59 AM

## 2022-11-14 ENCOUNTER — Encounter: Payer: Self-pay | Admitting: Oncology

## 2022-11-21 ENCOUNTER — Other Ambulatory Visit: Payer: Self-pay

## 2022-11-21 ENCOUNTER — Ambulatory Visit: Payer: Medicare Other | Attending: Pulmonary Disease

## 2022-11-21 DIAGNOSIS — R0600 Dyspnea, unspecified: Secondary | ICD-10-CM | POA: Insufficient documentation

## 2022-11-21 DIAGNOSIS — R0602 Shortness of breath: Secondary | ICD-10-CM | POA: Insufficient documentation

## 2022-11-21 DIAGNOSIS — R0609 Other forms of dyspnea: Secondary | ICD-10-CM | POA: Diagnosis present

## 2022-11-21 LAB — PULMONARY FUNCTION TEST ARMC ONLY
DL/VA % pred: 132 %
DL/VA: 5.54 ml/min/mmHg/L
DLCO unc % pred: 89 %
DLCO unc: 16.83 ml/min/mmHg
FEF 25-75 Post: 2.03 L/s
FEF 25-75 Pre: 2.25 L/s
FEF2575-%Change-Post: -10 %
FEF2575-%Pred-Post: 110 %
FEF2575-%Pred-Pre: 122 %
FEV1-%Change-Post: -3 %
FEV1-%Pred-Post: 83 %
FEV1-%Pred-Pre: 86 %
FEV1-Post: 1.82 L
FEV1-Pre: 1.87 L
FEV1FVC-%Change-Post: 0 %
FEV1FVC-%Pred-Pre: 112 %
FEV6-%Change-Post: -3 %
FEV6-%Pred-Post: 77 %
FEV6-%Pred-Pre: 80 %
FEV6-Post: 2.13 L
FEV6-Pre: 2.2 L
FEV6FVC-%Pred-Post: 104 %
FEV6FVC-%Pred-Pre: 104 %
FVC-%Change-Post: -3 %
FVC-%Pred-Post: 74 %
FVC-%Pred-Pre: 76 %
FVC-Post: 2.13 L
FVC-Pre: 2.2 L
Post FEV1/FVC ratio: 85 %
Post FEV6/FVC ratio: 100 %
Pre FEV1/FVC ratio: 85 %
Pre FEV6/FVC Ratio: 100 %
RV % pred: 89 %
RV: 1.9 L
TLC % pred: 89 %
TLC: 4.38 L

## 2022-11-21 MED ORDER — ALBUTEROL SULFATE (2.5 MG/3ML) 0.083% IN NEBU
2.5000 mg | INHALATION_SOLUTION | Freq: Once | RESPIRATORY_TRACT | Status: AC
  Administered 2022-11-21: 2.5 mg via RESPIRATORY_TRACT
  Filled 2022-11-21: qty 3

## 2022-11-21 MED FILL — Amlodipine Besylate Tab 5 MG (Base Equivalent): ORAL | 90 days supply | Qty: 90 | Fill #0 | Status: AC

## 2022-11-25 ENCOUNTER — Ambulatory Visit (INDEPENDENT_AMBULATORY_CARE_PROVIDER_SITE_OTHER): Payer: Medicare Other | Admitting: Pulmonary Disease

## 2022-11-25 ENCOUNTER — Encounter: Payer: Self-pay | Admitting: Pulmonary Disease

## 2022-11-25 VITALS — BP 130/76 | HR 80 | Temp 97.8°F | Ht 63.0 in | Wt 221.6 lb

## 2022-11-25 DIAGNOSIS — R0602 Shortness of breath: Secondary | ICD-10-CM | POA: Diagnosis not present

## 2022-11-25 LAB — NITRIC OXIDE: Nitric Oxide: 31

## 2022-11-25 NOTE — Progress Notes (Signed)
Synopsis: Referred in by Dale [place], MD   Subjective:   [name] GENDER: female DOB: December 11, 1950, MRN: [id number]  Chief Complaint  Patient presents with   Follow-up    No cough, or wheezing. Shortness of breath on exertion.     HPI Kelly Wallace is a pleasant 72 y.o female patient with a past medical history of hypertension, HFpEF, morbid obesity, presenting to the  pulmonary clinic for follow up on exertional shortness of breath.   She reports that its been going on for years and gradually worsening. She denies any chest wheezing or tightness. Does report dry cough. Denies any hemoptysis or chest pain. Denies any weight changes and GERD symptoms. She was prescribed an albuterol inhaler for an event of acute bronchitis iso viral infection and has been using it on and off with some relief.   In the interim she underwent PFTs that showed an FVC at the LLN, normal FEV-1 and increased FEV-1 to FVC ratio. TLC normal and DLCO normal. High res CT chest Normal. Using albuterol as needed.   FH: Mother with Diabetes and Father was a smoker with COPD.   SH:  Never smoker, denies alcohol use and illicit drug use. She works here at [employer] no environmental exposures. Has 2 dogs and 1 cat at home no birds.   ROS All systems were reviewed and are negative except for the above.   Objective:   Vitals:   11/25/22 1031  BP: 130/76  Pulse: 80  Temp: 97.8 F (36.6 C)  TempSrc: Temporal  SpO2: 98%  Weight: 221 lb 9.6 oz (100.5 kg)  Height: 5\' 3"  (1.6 m)   98% on RA BMI Readings from Last 3 Encounters:  11/25/22 39.25 kg/m  10/28/22 38.56 kg/m  08/26/22 38.62 kg/m   Wt Readings from Last 3 Encounters:  11/25/22 221 lb 9.6 oz (100.5 kg)  10/28/22 217 lb 11.2 oz (98.7 kg)  08/26/22 225 lb (102.1 kg)    Physical Exam GEN: NAD HEENT: Supple Neck, Reactive Pupils, EOMI  CVS: Normal S1, Normal S2, RRR, No murmurs or ES appreciated  Lungs: Diminished breath sounds at  the bases with faint crackles. No wheezing.  Abdomen: Soft, non tender, non distended, + BS  Extremities: Warm and well perfused, trace edema  Skin: No suspicious lesions appreciated  Psych: Normal Affect  Ancillary Information   CBC    Component Value Date/Time   WBC 5.4 10/28/2022 1032   RBC 4.34 10/28/2022 1032   HGB 13.1 10/28/2022 1032   HGB 10.6 (L) 05/07/2014 0350   HCT 40.7 10/28/2022 1032   HCT 35.9 04/20/2014 0837   PLT 170 10/28/2022 1032   PLT 172 05/06/2014 0711   MCV 93.8 10/28/2022 1032   MCV 89 04/20/2014 0837   MCH 30.2 10/28/2022 1032   MCHC 32.2 10/28/2022 1032   RDW 13.2 10/28/2022 1032   RDW 14.8 (H) 04/20/2014 0837   LYMPHSABS 2.4 04/26/2022 1300   MONOABS 0.4 04/26/2022 1300   EOSABS 0.2 04/26/2022 1300   BASOSABS 0.0 04/26/2022 1300    Imaging  CXR 05/14/2022: mild increased lung markings   Echocardiogram 11/2021: Normal LV function, Grade 1 diastolic dysfunction with RVSP 39.3 mmHg.  High Res CT chest 08/30/2022  No parenchymal lung disease.      Latest Ref Rng & Units 11/21/2022    4:32 PM  PFT Results  FVC-Pre L 2.20  P  FVC-Predicted Pre % 76  P  FVC-Post L 2.13  P  FVC-Predicted Post % 74  P  Pre FEV1/FVC % % 85  P  Post FEV1/FCV % % 85  P  FEV1-Pre L 1.87  P  FEV1-Predicted Pre % 86  P  FEV1-Post L 1.82  P  DLCO uncorrected ml/min/mmHg 16.83  P  DLCO UNC% % 89  P  DLVA Predicted % 132  P  TLC L 4.38  P  TLC % Predicted % 89  P  RV % Predicted % 89  P    P Preliminary result     Assessment & Plan:  Kelly Wallace is a pleasant 72 y.o female patient with a past medical history of hypertension, HFpEF, morbid obesity, presenting to the  pulmonary clinic for exertional shortness of breath.   #Shortness of breath  #Morbid Obesity  #Grade 1 Diastolic dysfunction on echocardiogram 11/2021 Eos 200  FeNo 31.  PFTs with slightly reduced FVC and increased ratio in the setting of morbid obesity.   Likely multifactorial in the setting  of obesity and HFpEF. Possible Intermittent asthma with Eos 200 and FeNO 31 however history not convincing. Will continue to use Albuterol as needed and continue with weight loss.   []  Continue with Albuterol as needed pending above results.  []  Advised on weight loss []  Diuretic therapy per cardiology   Return in about 6 months (around 05/25/2023).  I spent 40 minutes caring for this patient today, including preparing to see the patient, obtaining a medical history , reviewing a separately obtained history, performing a medically appropriate examination and/or evaluation, counseling and educating the patient/family/caregiver, ordering medications, tests, or procedures, documenting clinical information in the electronic health record, and independently interpreting results (not separately reported/billed) and communicating results to the patient/family/caregiver  Janann Colonel, MD [facility] 11/25/2022 11:04 AM

## 2022-11-28 ENCOUNTER — Inpatient Hospital Stay: Payer: Medicare Other | Attending: Oncology

## 2022-11-28 DIAGNOSIS — E538 Deficiency of other specified B group vitamins: Secondary | ICD-10-CM | POA: Insufficient documentation

## 2022-11-28 DIAGNOSIS — E611 Iron deficiency: Secondary | ICD-10-CM

## 2022-11-28 MED ORDER — CYANOCOBALAMIN 1000 MCG/ML IJ SOLN
1000.0000 ug | Freq: Once | INTRAMUSCULAR | Status: AC
  Administered 2022-11-28: 1000 ug via INTRAMUSCULAR
  Filled 2022-11-28: qty 1

## 2022-12-18 DIAGNOSIS — H02831 Dermatochalasis of right upper eyelid: Secondary | ICD-10-CM | POA: Diagnosis not present

## 2022-12-18 DIAGNOSIS — H25813 Combined forms of age-related cataract, bilateral: Secondary | ICD-10-CM | POA: Diagnosis not present

## 2022-12-18 DIAGNOSIS — H02834 Dermatochalasis of left upper eyelid: Secondary | ICD-10-CM | POA: Diagnosis not present

## 2022-12-18 DIAGNOSIS — H35013 Changes in retinal vascular appearance, bilateral: Secondary | ICD-10-CM | POA: Diagnosis not present

## 2022-12-18 DIAGNOSIS — H35033 Hypertensive retinopathy, bilateral: Secondary | ICD-10-CM | POA: Diagnosis not present

## 2022-12-27 ENCOUNTER — Inpatient Hospital Stay: Payer: Medicare Other | Attending: Oncology

## 2023-01-27 ENCOUNTER — Inpatient Hospital Stay: Payer: Medicare Other | Attending: Oncology

## 2023-01-27 DIAGNOSIS — E611 Iron deficiency: Secondary | ICD-10-CM

## 2023-01-27 DIAGNOSIS — E538 Deficiency of other specified B group vitamins: Secondary | ICD-10-CM | POA: Diagnosis not present

## 2023-01-27 MED ORDER — CYANOCOBALAMIN 1000 MCG/ML IJ SOLN
1000.0000 ug | Freq: Once | INTRAMUSCULAR | Status: AC
Start: 2023-01-27 — End: 2023-01-27
  Administered 2023-01-27: 1000 ug via INTRAMUSCULAR
  Filled 2023-01-27: qty 1

## 2023-02-02 ENCOUNTER — Other Ambulatory Visit: Payer: Self-pay

## 2023-02-02 ENCOUNTER — Emergency Department
Admission: EM | Admit: 2023-02-02 | Discharge: 2023-02-02 | Disposition: A | Discharge status: Home / Self Care | Payer: Medicare Other | Attending: Emergency Medicine | Admitting: Emergency Medicine

## 2023-02-02 DIAGNOSIS — J101 Influenza due to other identified influenza virus with other respiratory manifestations: Secondary | ICD-10-CM | POA: Diagnosis not present

## 2023-02-02 DIAGNOSIS — I10 Essential (primary) hypertension: Secondary | ICD-10-CM | POA: Insufficient documentation

## 2023-02-02 DIAGNOSIS — Z8616 Personal history of COVID-19: Secondary | ICD-10-CM | POA: Insufficient documentation

## 2023-02-02 DIAGNOSIS — R059 Cough, unspecified: Secondary | ICD-10-CM | POA: Diagnosis present

## 2023-02-02 DIAGNOSIS — Z20822 Contact with and (suspected) exposure to covid-19: Secondary | ICD-10-CM | POA: Insufficient documentation

## 2023-02-02 LAB — RESP PANEL BY RT-PCR (RSV, FLU A&B, COVID)  RVPGX2
Influenza A by PCR: POSITIVE — AB
Influenza B by PCR: NEGATIVE
Resp Syncytial Virus by PCR: NEGATIVE
SARS Coronavirus 2 by RT PCR: NEGATIVE

## 2023-02-02 NOTE — Discharge Instructions (Signed)
 Please follow up with your outpatient provider. Your flu swab is positive. Return for any new, worsening, or change in symptoms or other concerns. It was a pleasure caring for you today.

## 2023-02-02 NOTE — ED Provider Notes (Signed)
 [facility] Provider Note    Event Date/Time   First MD Initiated Contact with Patient 02/02/23 1348     (approximate)   History   Cough   HPI  Kelly Wallace is a 73 y.o. female who presents today with cough and runny nose for the past 3 days. She denies chest pain or SOB. Denies fevers or chills. No abdominal pain, N/V/D. 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 Got a flu shot this year.   Patient Active Problem List   Diagnosis Date Noted   Osteoporosis 07/21/2022   Hearing loss 09/24/2021   Lactic acidosis 08/26/2021   Influenza A 08/26/2021   Severe sepsis (HCC) 08/26/2021   Acute bronchitis 08/25/2021   SOB (shortness of breath) on exertion 07/18/2021   B12 deficiency 08/02/2019   Thrombocytopenia (HCC) 02/21/2019   COVID-19 virus infection 02/21/2019   Chronic venous insufficiency 09/24/2017   Leg pain, bilateral 09/22/2017   Vitamin D  deficiency 09/22/2017   Pain in limb 08/01/2017   Bilateral leg numbness 02/26/2017   Primary localized osteoarthritis of left knee 02/27/2016   Left knee pain 04/03/2015   Health care maintenance 11/04/2014   Knee pain, bilateral 02/27/2014   Iron deficiency 03/01/2013   Nipple discharge 12/26/2012   Essential hypertension, benign 04/07/2012   Elevated alkaline phosphatase level 04/07/2012   Anemia 04/07/2012   History of colonic polyps 04/07/2012           Physical Exam   Triage Vital Signs: ED Triage Vitals [02/02/23 1205]  Encounter Vitals Group     BP (!) 162/89     Systolic BP Percentile      Diastolic BP Percentile      Pulse Rate 86     Resp 19     Temp 98 F (36.7 C)     Temp src      SpO2 99 %     Weight 200 lb (90.7 kg)     Height 5' 4 (1.626 m)     Head Circumference      Peak Flow      Pain Score 4     Pain Loc      Pain Education      Exclude from Growth Chart     Most recent vital signs: Vitals:   02/02/23  1205  BP: (!) 162/89  Pulse: 86  Resp: 19  Temp: 98 F (36.7 C)  SpO2: 99%    Physical Exam Vitals and nursing note reviewed.  Constitutional:      General: Awake and alert. No acute distress.    Appearance: Normal appearance. The patient is obese.  HENT:     Head: Normocephalic and atraumatic.     Mouth: Mucous membranes are moist.  Eyes:     General: PERRL. Normal EOMs        Right eye: No discharge.        Left eye: No discharge.     Conjunctiva/sclera: Conjunctivae normal.  Cardiovascular:     Rate and Rhythm: Normal rate and regular rhythm.     Pulses: Normal pulses.  Pulmonary:     Effort: Pulmonary effort is normal. No respiratory distress. Dry cough on exam. No accessory muscle use.    Breath sounds: Normal breath sounds.  Abdominal:     Abdomen is soft. There is no abdominal tenderness. No rebound or guarding. No distention. Musculoskeletal:  General: No swelling. Normal range of motion.     Cervical back: Normal range of motion and neck supple.  Skin:    General: Skin is warm and dry.     Capillary Refill: Capillary refill takes less than 2 seconds.     Findings: No rash.  Neurological:     Mental Status: The patient is awake and alert.      ED Results / Procedures / Treatments   Labs (all labs ordered are listed, but only abnormal results are displayed) Labs Reviewed  RESP PANEL BY RT-PCR (RSV, FLU A&B, COVID)  [id number] - Abnormal; Notable for the following components:      Result Value   Influenza A by PCR POSITIVE (*)    All other components within normal limits     EKG     RADIOLOGY     PROCEDURES:  Critical Care performed:   Procedures   MEDICATIONS ORDERED IN ED: Medications - No data to display   IMPRESSION / MDM / ASSESSMENT AND PLAN / ED COURSE  I reviewed the triage vital signs and the nursing notes.   Differential diagnosis includes, but is not limited to, influenza, covid-19, RSV, bronchitis,  pneumonia.  Patient is awake and alert and hemodynamically stable and afebrile. She is non-toxic in appearance. She has lungs CTAB and no increased work of breathing or accessory muscle use. No fever, CP/SOB, normal O2 on RA, and lungs CTAB, doubt pneumonia.  Swab obtained in triage is positive for Influenza A. We discussed the option of tamiflu  however patient declined. We discussed strict return precautions and the importance of close outpatient follow up. Patient understands and agrees with plan.  She is overall quite well in appearance.  She was given a work note.  She was discharged in stable condition.   Patient's presentation is most consistent with acute complicated illness / injury requiring diagnostic workup.      FINAL CLINICAL IMPRESSION(S) / ED DIAGNOSES   Final diagnoses:  Influenza A     Rx / DC Orders   ED Discharge Orders     None        Note:  This document was prepared using Dragon voice recognition software and may include unintentional dictation errors.   Mikaila Grunert E, PA-C 02/02/23 1432    Arlander Charleston, MD 02/02/23 [phone]

## 2023-02-02 NOTE — ED Triage Notes (Signed)
 Pt comes with 2 days of cough congestion and runny nose. Pt states some sob with cough.

## 2023-02-02 NOTE — ED Provider Triage Note (Signed)
 Emergency Medicine Provider Triage Evaluation Note  Kelly Wallace , a 73 y.o. female  was evaluated in triage.  Pt complains of cough and congestion x 2 days. No fever.  Physical Exam  LMP 04/06/2009  Gen:   Awake, no distress   Resp:  Normal effort  MSK:   Moves extremities without difficulty  Other:    Medical Decision Making  Medically screening exam initiated at 11:52 AM.  Appropriate orders placed.  DEISHA STULL was informed that the remainder of the evaluation will be completed by another provider, this initial triage assessment does not replace that evaluation, and the importance of remaining in the ED until their evaluation is complete.     Herlinda Kirk NOVAK, FNP 02/02/23 1153

## 2023-02-10 ENCOUNTER — Telehealth: Payer: Self-pay | Admitting: *Deleted

## 2023-02-10 NOTE — Progress Notes (Signed)
Transition Care Management Unsuccessful Follow-up Telephone Call  Date of discharge and from where:  [facility]  02/02/2023  Attempts:  1st Attempt  Reason for unsuccessful TCM follow-up call:  Left voice message

## 2023-02-11 ENCOUNTER — Telehealth: Payer: Self-pay | Admitting: *Deleted

## 2023-02-27 ENCOUNTER — Inpatient Hospital Stay: Payer: Medicare Other | Attending: Oncology

## 2023-02-27 ENCOUNTER — Other Ambulatory Visit: Payer: Self-pay

## 2023-02-27 DIAGNOSIS — E611 Iron deficiency: Secondary | ICD-10-CM

## 2023-02-27 DIAGNOSIS — E538 Deficiency of other specified B group vitamins: Secondary | ICD-10-CM | POA: Insufficient documentation

## 2023-02-27 MED ORDER — CYANOCOBALAMIN 1000 MCG/ML IJ SOLN
1000.0000 ug | Freq: Once | INTRAMUSCULAR | Status: AC
  Administered 2023-02-27: 1000 ug via INTRAMUSCULAR
  Filled 2023-02-27: qty 1

## 2023-02-27 MED FILL — Amlodipine Besylate Tab 5 MG (Base Equivalent): ORAL | 90 days supply | Qty: 90 | Fill #1 | Status: AC

## 2023-03-27 ENCOUNTER — Inpatient Hospital Stay: Payer: Medicare Other | Attending: Oncology

## 2023-04-16 ENCOUNTER — Other Ambulatory Visit: Payer: Self-pay | Admitting: Internal Medicine

## 2023-04-16 DIAGNOSIS — Z1231 Encounter for screening mammogram for malignant neoplasm of breast: Secondary | ICD-10-CM

## 2023-04-28 ENCOUNTER — Inpatient Hospital Stay: Payer: Medicare Other | Attending: Oncology

## 2023-05-28 ENCOUNTER — Inpatient Hospital Stay: Payer: Medicare Other | Attending: Oncology

## 2023-05-28 DIAGNOSIS — E538 Deficiency of other specified B group vitamins: Secondary | ICD-10-CM | POA: Diagnosis not present

## 2023-05-28 DIAGNOSIS — E611 Iron deficiency: Secondary | ICD-10-CM

## 2023-05-28 MED ORDER — CYANOCOBALAMIN 1000 MCG/ML IJ SOLN
1000.0000 ug | Freq: Once | INTRAMUSCULAR | Status: AC
  Administered 2023-05-28: 1000 ug via INTRAMUSCULAR
  Filled 2023-05-28: qty 1

## 2023-05-30 ENCOUNTER — Ambulatory Visit
Admission: RE | Admit: 2023-05-30 | Discharge: 2023-05-30 | Disposition: A | Discharge status: Home / Self Care | Source: Ambulatory Visit | Attending: Internal Medicine | Admitting: Internal Medicine

## 2023-05-30 DIAGNOSIS — Z1231 Encounter for screening mammogram for malignant neoplasm of breast: Secondary | ICD-10-CM | POA: Diagnosis not present

## 2023-06-27 ENCOUNTER — Inpatient Hospital Stay: Payer: Medicare Other | Attending: Oncology

## 2023-07-22 ENCOUNTER — Encounter: Payer: Self-pay | Admitting: Internal Medicine

## 2023-07-22 ENCOUNTER — Ambulatory Visit (INDEPENDENT_AMBULATORY_CARE_PROVIDER_SITE_OTHER): Admitting: Internal Medicine

## 2023-07-22 ENCOUNTER — Other Ambulatory Visit: Payer: Self-pay

## 2023-07-22 VITALS — BP 130/72 | HR 74 | Temp 97.5°F | Ht 63.0 in | Wt 206.4 lb

## 2023-07-22 DIAGNOSIS — M810 Age-related osteoporosis without current pathological fracture: Secondary | ICD-10-CM

## 2023-07-22 DIAGNOSIS — D696 Thrombocytopenia, unspecified: Secondary | ICD-10-CM | POA: Diagnosis not present

## 2023-07-22 DIAGNOSIS — E559 Vitamin D deficiency, unspecified: Secondary | ICD-10-CM

## 2023-07-22 DIAGNOSIS — I10 Essential (primary) hypertension: Secondary | ICD-10-CM

## 2023-07-22 DIAGNOSIS — R748 Abnormal levels of other serum enzymes: Secondary | ICD-10-CM

## 2023-07-22 DIAGNOSIS — Z860100 Personal history of colon polyps, unspecified: Secondary | ICD-10-CM | POA: Diagnosis not present

## 2023-07-22 DIAGNOSIS — Z0000 Encounter for general adult medical examination without abnormal findings: Secondary | ICD-10-CM | POA: Diagnosis not present

## 2023-07-22 DIAGNOSIS — D649 Anemia, unspecified: Secondary | ICD-10-CM

## 2023-07-22 LAB — BASIC METABOLIC PANEL WITH GFR
BUN: 10 mg/dL (ref 6–23)
CO2: 26 meq/L (ref 19–32)
Calcium: 8.6 mg/dL (ref 8.4–10.5)
Chloride: 108 meq/L (ref 96–112)
Creatinine, Ser: 0.61 mg/dL (ref 0.40–1.20)
GFR: 90.12 mL/min (ref >60.00)
Glucose, Bld: 93 mg/dL (ref 70–99)
Potassium: 4 meq/L (ref 3.5–5.1)
Sodium: 140 meq/L (ref 135–145)

## 2023-07-22 LAB — HEPATIC FUNCTION PANEL
ALT: 12 U/L (ref 0–35)
AST: 16 U/L (ref 0–37)
Albumin: 4 g/dL (ref 3.5–5.2)
Alkaline Phosphatase: 113 U/L (ref 39–117)
Bilirubin, Direct: 0.3 mg/dL (ref 0.0–0.3)
Total Bilirubin: 0.8 mg/dL (ref 0.2–1.2)
Total Protein: 6.9 g/dL (ref 6.0–8.3)

## 2023-07-22 LAB — VITAMIN D 25 HYDROXY (VIT D DEFICIENCY, FRACTURES): VITD: <7 ng/mL — ABNORMAL LOW (ref 30.00–100.00)

## 2023-07-22 LAB — TSH: TSH: 0.91 u[IU]/mL (ref 0.35–5.50)

## 2023-07-22 LAB — LIPID PANEL
Cholesterol: 91 mg/dL (ref 0–200)
HDL: 46.7 mg/dL (ref >39.00)
LDL Cholesterol: 29 mg/dL (ref 0–99)
NonHDL: 44.31
Total CHOL/HDL Ratio: 2
Triglycerides: 75 mg/dL (ref 0.0–149.0)
VLDL: 15 mg/dL (ref 0.0–40.0)

## 2023-07-22 MED ORDER — AMLODIPINE BESYLATE 5 MG PO TABS
5.0000 mg | ORAL_TABLET | Freq: Every day | ORAL | 1 refills | Status: DC
  Filled 2023-07-22 – 2023-08-05 (×2): qty 90, 90d supply, fill #0

## 2023-07-22 MED ORDER — ALBUTEROL SULFATE HFA 108 (90 BASE) MCG/ACT IN AERS
2.0000 | INHALATION_SPRAY | Freq: Four times a day (QID) | RESPIRATORY_TRACT | 0 refills | Status: AC | PRN
  Filled 2023-07-22 (×2): qty 6.7, 25d supply, fill #0
  Filled 2023-08-05: qty 8, 30d supply, fill #0

## 2023-07-22 NOTE — Assessment & Plan Note (Signed)
 Physical today 07/22/23. Colonoscopy 04/10/17 - tubular adenomatous polyp (transverse colon).  Recommended f/u in 5 years - Dr Unk.  Due. Mammogram 05/30/23 - Birads I.

## 2023-07-22 NOTE — Progress Notes (Signed)
 Subjective:    [name], female    DOB: 01/10/1951, 73 y.o.   MRN: [id number]  Patient here for  Chief Complaint  Patient presents with   Annual Exam    HPI Here for physical. Last seen 06/2022. Saw Dr Gollan 08/2021 for evaluation - sob with exertion. ECHO 11/2021 - EF 60-65% with G1DD. Mild to moderate regurgitation of tricuspid valve. Mildly elevated right heart pressures. Recommended lasix  20mg  three times per week with potassium. Also noted borderline dilatation of the ascending aorta. Seeing Dr Melanee for iron deficient anemia. Receiving B12 injections. Last seen 10/2022 - hgb and iron studies wnl. Recommended f/u in one year. Referred to pulmonary last visit. Saw Dr Assakar 08/26/22 - recommended PFTs, CT chest. Continue albuterol  prn. Had f/u 11/2022 - high res CT normal. PFTs - slightly reduced FVC and increased ratio in the setting of obesity. Breathing overall stable. Not taking lasix /potassium. No chest pain or sob reported. No abdominal pain or bowel change reported.    Past Medical History:  Diagnosis Date   Anemia    not currently being treated for this   Arthritis    History of colon polyps    Hypertension    Past Surgical History:  Procedure Laterality Date   CHOLECYSTECTOMY  1989   COLONOSCOPY WITH PROPOFOL  N/A 04/10/2017   Procedure: COLONOSCOPY WITH PROPOFOL ;  Surgeon: Unk Corinn Skiff, MD;  Location: [facility];  Service: Gastroenterology;  Laterality: N/A;   ESOPHAGOGASTRODUODENOSCOPY (EGD) WITH PROPOFOL  N/A 04/10/2017   Procedure: ESOPHAGOGASTRODUODENOSCOPY (EGD) WITH PROPOFOL ;  Surgeon: Unk Corinn Skiff, MD;  Location: [facility];  Service: Gastroenterology;  Laterality: N/A;   GASTRIC BYPASS  03/21/03   JOINT REPLACEMENT Right 04/2014   TOTAL KNEE REPLACEMENT, DR. KATHLYNN, [facility]   TOTAL KNEE ARTHROPLASTY Left 02/27/2016   Procedure: TOTAL KNEE ARTHROPLASTY;  Surgeon: Ozell KATHLYNN, MD;  Location: [facility];  Service: Orthopedics;  Laterality:  Left;   TUBAL LIGATION  1976   Family History  Problem Relation Age of Onset   Hypertension Mother    Diabetes Mother    Heart disease Father    Cancer Maternal Aunt        question of type   Cancer Maternal Grandmother        colon   Breast cancer Cousin 68       maternal side   Social History   Socioeconomic History   Marital status: Married    Spouse name: Not on file   Number of children: 2   Years of education: Not on file   Highest education level: Not on file  Occupational History    Employer: [employer]  Tobacco Use   Smoking status: Never   Smokeless tobacco: Never  Vaping Use   Vaping status: Never Used  Substance and Sexual Activity   Alcohol use: No    Alcohol/week: 0.0 standard drinks of alcohol   Drug use: No   Sexual activity: Yes  Other Topics Concern   Not on file  Social History Narrative   Not on file   Social Drivers of Health   Financial Resource Strain: Low Risk  (04/05/2022)   Overall Financial Resource Strain (CARDIA)    Difficulty of Paying Living Expenses: Not hard at all  Food Insecurity: No Food Insecurity (04/05/2022)   Hunger Vital Sign    Worried About Running Out of Food in the Last Year: Never true    Ran Out of Food in the Last Year: Never  true  Transportation Needs: No Transportation Needs (04/05/2022)   PRAPARE - Administrator, Civil Service (Medical): No    Lack of Transportation (Non-Medical): No  Physical Activity: Sufficiently Active (04/05/2022)   Exercise Vital Sign    Days of Exercise per Week: 5 days    Minutes of Exercise per Session: 30 min  Stress: No Stress Concern Present (04/05/2022)   [employer] of Occupational Health - Occupational Stress Questionnaire    Feeling of Stress : Not at all  Social Connections: Unknown (04/05/2022)   Social Connection and Isolation Panel    Frequency of Communication with Friends and Family: More than three times a week    Frequency of Social Gatherings with Friends  and Family: More than three times a week    Attends Religious Services: More than 4 times per year    Active Member of [employer] or Organizations: Yes    Attends Engineer, structural: More than 4 times per year    Marital Status: Not on file     Review of Systems  Constitutional:  Negative for appetite change and unexpected weight change.  HENT:  Negative for congestion, sinus pressure and sore throat.   Eyes:  Negative for pain and visual disturbance.  Respiratory:  Negative for cough, chest tightness and shortness of breath.   Cardiovascular:  Negative for chest pain, palpitations and leg swelling.  Gastrointestinal:  Negative for abdominal pain, diarrhea, nausea and vomiting.  Genitourinary:  Negative for difficulty urinating and dysuria.  Musculoskeletal:  Negative for joint swelling and myalgias.  Skin:  Negative for color change and rash.  Neurological:  Negative for dizziness and headaches.  Hematological:  Negative for adenopathy. Does not bruise/bleed easily.  Psychiatric/Behavioral:  Negative for agitation and dysphoric mood.        Objective:     BP 130/72   Pulse 74   Temp (!) 97.5 F (36.4 C) (Oral)   Ht 5' 3 (1.6 m)   Wt 206 lb 6.4 oz (93.6 kg)   LMP 04/06/2009   SpO2 97%   BMI 36.56 kg/m  Wt Readings from Last 3 Encounters:  07/22/23 206 lb 6.4 oz (93.6 kg)  02/02/23 200 lb (90.7 kg)  11/25/22 221 lb 9.6 oz (100.5 kg)    Physical Exam Vitals reviewed.  Constitutional:      General: She is not in acute distress.    Appearance: Normal appearance. She is well-developed.  HENT:     Head: Normocephalic and atraumatic.     Right Ear: External ear normal.     Left Ear: External ear normal.     Mouth/Throat:     Pharynx: No oropharyngeal exudate or posterior oropharyngeal erythema.  Eyes:     General: No scleral icterus.       Right eye: No discharge.        Left eye: No discharge.     Conjunctiva/sclera: Conjunctivae normal.  Neck:      Thyroid : No thyromegaly.  Cardiovascular:     Rate and Rhythm: Normal rate and regular rhythm.  Pulmonary:     Effort: No tachypnea, accessory muscle usage or respiratory distress.     Breath sounds: Normal breath sounds. No decreased breath sounds or wheezing.  Chest:  Breasts:    Right: No inverted nipple, mass, nipple discharge or tenderness (no axillary adenopathy).     Left: No inverted nipple, mass, nipple discharge or tenderness (no axilarry adenopathy).  Abdominal:     General:  Bowel sounds are normal.     Palpations: Abdomen is soft.     Tenderness: There is no abdominal tenderness.  Musculoskeletal:        General: No swelling or tenderness.     Cervical back: Neck supple.  Lymphadenopathy:     Cervical: No cervical adenopathy.  Skin:    Findings: No erythema or rash.  Neurological:     Mental Status: She is alert and oriented to person, place, and time.  Psychiatric:        Mood and Affect: Mood normal.        Behavior: Behavior normal.         Outpatient Encounter Medications as of 07/22/2023  Medication Sig   alendronate  (FOSAMAX ) 70 MG tablet Take 1 tablet (70 mg total) by mouth every 7 (seven) days. Take with a full glass of water on an empty stomach.   polyethylene glycol powder (GLYCOLAX /MIRALAX ) 17 GM/SCOOP powder Take 17 g by mouth daily. PRN   potassium chloride  (KLOR-CON ) 10 MEQ tablet Take 1 tablet (10 mEq total) by mouth every Monday, Wednesday, and Friday.   Vitamin D , Ergocalciferol , (DRISDOL ) 1.25 MG (50000 UNIT) CAPS capsule Take 1 capsule (50,000 Units total) by mouth every 7 (seven) days.   [DISCONTINUED] albuterol  (VENTOLIN  HFA) 108 (90 Base) MCG/ACT inhaler Inhale 2 puffs into the lungs every 6 (six) hours as needed for wheezing or shortness of breath.   [DISCONTINUED] amLODipine  (NORVASC ) 5 MG tablet Take 1 tablet (5 mg total) by mouth daily.   albuterol  (VENTOLIN  HFA) 108 (90 Base) MCG/ACT inhaler Inhale 2 puffs into the lungs every 6 (six) hours  as needed for wheezing or shortness of breath.   amLODipine  (NORVASC ) 5 MG tablet Take 1 tablet (5 mg total) by mouth daily.   furosemide  (LASIX ) 20 MG tablet Take 1 tablet (20 mg total) by mouth every Monday, Wednesday, and Friday. (Patient not taking: Reported on 07/22/2023)   No facility-administered encounter medications on file as of 07/22/2023.     Lab Results  Component Value Date   WBC 5.4 10/28/2022   HGB 13.1 10/28/2022   HCT 40.7 10/28/2022   PLT 170 10/28/2022   GLUCOSE 93 07/22/2023   CHOL 91 07/22/2023   TRIG 75.0 07/22/2023   HDL 46.70 07/22/2023   LDLCALC 29 07/22/2023   ALT 12 07/22/2023   AST 16 07/22/2023   NA 140 07/22/2023   K 4.0 07/22/2023   CL 108 07/22/2023   CREATININE 0.61 07/22/2023   BUN 10 07/22/2023   CO2 26 07/22/2023   TSH 0.91 07/22/2023   INR 1.14 02/21/2016   HGBA1C 5.6 03/25/2019    MM 3D SCREENING MAMMOGRAM BILATERAL BREAST Result Date: 06/03/2023 CLINICAL DATA:  Screening. EXAM: DIGITAL SCREENING BILATERAL MAMMOGRAM WITH TOMOSYNTHESIS AND CAD TECHNIQUE: Bilateral screening digital craniocaudal and mediolateral oblique mammograms were obtained. Bilateral screening digital breast tomosynthesis was performed. The images were evaluated with computer-aided detection. COMPARISON:  Previous exam(s). ACR Breast Density Category a: The breasts are almost entirely fatty. FINDINGS: There are no findings suspicious for malignancy. IMPRESSION: No mammographic evidence of malignancy. A result letter of this screening mammogram will be mailed directly to the patient. RECOMMENDATION: Screening mammogram in one year. (Code:SM-B-01Y) BI-RADS CATEGORY  1: Negative. Electronically Signed   By: Debby Satterfield M.D.   On: 06/03/2023 16:36       Assessment & Plan:  Routine general medical examination at a health care facility  Essential hypertension, benign Assessment & Plan: Continue amlodipine . Blood  pressure as outlined. No changes in medication. Follow  pressures. Follow metabolic panel.   Orders: -     Basic metabolic panel with GFR -     Hepatic function panel -     Lipid panel -     TSH  Vitamin D  deficiency Assessment & Plan: Recheck vitamin D  level today.   Orders: -     VITAMIN D  25 Hydroxy (Vit-D Deficiency, Fractures)  Osteoporosis without current pathological fracture, unspecified osteoporosis type Assessment & Plan: Bone density revealed osteoporosis. Check vitamin D  level.    Health care maintenance Assessment & Plan: Physical today 07/22/23. Colonoscopy 04/10/17 - tubular adenomatous polyp (transverse colon).  Recommended f/u in 5 years - Dr Unk.  Due. Mammogram 05/30/23 - Birads I.    Anemia, unspecified type Assessment & Plan: Has seen GI.  Being followed by hematology.  Has had iron infusions previously.  Follow cbc. Continue f/u with hematology.    Elevated alkaline phosphatase level Assessment & Plan: Recheck liver panel.    History of colonic polyps Assessment & Plan: Colonoscopy 04/10/17 - Non-thrombosed external hemorrhoids found on perianal exam. The examined portion of the ileum was normal. One 5 mm polyp in the transverse colon, removed with a cold snare. Resected and retrieved. Non-bleeding external hemorrhoids. Recommended f/u in 5 years (Dr Unk). Discussed due f/u colonoscopy. Agreeable for referral.    Thrombocytopenia (HCC) Assessment & Plan: Follow cbc.    Other orders -     Albuterol  Sulfate HFA; Inhale 2 puffs into the lungs every 6 (six) hours as needed for wheezing or shortness of breath.  Dispense: 8 g; Refill: 0 -     amLODIPine  Besylate; Take 1 tablet (5 mg total) by mouth daily.  Dispense: 90 tablet; Refill: 1     Allena Hamilton, MD

## 2023-07-23 ENCOUNTER — Ambulatory Visit: Payer: Self-pay | Admitting: Internal Medicine

## 2023-07-23 ENCOUNTER — Other Ambulatory Visit: Payer: Self-pay

## 2023-07-23 MED ORDER — VITAMIN D (ERGOCALCIFEROL) 1.25 MG (50000 UNIT) PO CAPS
50000.0000 [IU] | ORAL_CAPSULE | ORAL | 1 refills | Status: DC
  Filled 2023-07-23 – 2023-08-05 (×2): qty 12, 84d supply, fill #0

## 2023-07-23 NOTE — Telephone Encounter (Signed)
 Copied from CRM [id number]. Topic: Clinical - Lab/Test Results >> Jul 23, 2023  1:49 PM Ernestene P wrote: Reason for CRM: Pt called in , relayed labs results- pt understood.

## 2023-07-27 ENCOUNTER — Encounter: Payer: Self-pay | Admitting: Internal Medicine

## 2023-07-27 NOTE — Assessment & Plan Note (Signed)
 Recheck vitamin  D level today.

## 2023-07-27 NOTE — Assessment & Plan Note (Signed)
 Colonoscopy 04/10/17 - Non-thrombosed external hemorrhoids found on perianal exam. The examined portion of the ileum was normal. One 5 mm polyp in the transverse colon, removed with a cold snare. Resected and retrieved. Non-bleeding external hemorrhoids. Recommended f/u in 5 years (Dr Unk). Discussed due f/u colonoscopy. Agreeable for referral.

## 2023-07-27 NOTE — Assessment & Plan Note (Signed)
 Continue amlodipine . Blood pressure as outlined. No changes in medication. Follow pressures. Follow metabolic panel.

## 2023-07-27 NOTE — Assessment & Plan Note (Signed)
Recheck liver panel.  

## 2023-07-27 NOTE — Assessment & Plan Note (Signed)
 Has seen GI.  Being followed by hematology.  Has had iron infusions previously.  Follow cbc. Continue f/u with hematology.

## 2023-07-27 NOTE — Assessment & Plan Note (Signed)
 Bone density revealed osteoporosis. Check vitamin D  level.

## 2023-07-27 NOTE — Assessment & Plan Note (Signed)
 Follow cbc.

## 2023-07-28 ENCOUNTER — Inpatient Hospital Stay: Payer: Medicare Other | Attending: Oncology

## 2023-07-28 DIAGNOSIS — E611 Iron deficiency: Secondary | ICD-10-CM

## 2023-07-28 DIAGNOSIS — E538 Deficiency of other specified B group vitamins: Secondary | ICD-10-CM | POA: Diagnosis not present

## 2023-07-28 MED ORDER — CYANOCOBALAMIN 1000 MCG/ML IJ SOLN
1000.0000 ug | Freq: Once | INTRAMUSCULAR | Status: AC
  Administered 2023-07-28: 1000 ug via INTRAMUSCULAR
  Filled 2023-07-28: qty 1

## 2023-08-04 ENCOUNTER — Other Ambulatory Visit: Payer: Self-pay

## 2023-08-05 ENCOUNTER — Other Ambulatory Visit: Payer: Self-pay

## 2023-08-16 ENCOUNTER — Other Ambulatory Visit: Payer: Self-pay

## 2023-08-16 ENCOUNTER — Emergency Department
Admission: EM | Admit: 2023-08-16 | Discharge: 2023-08-16 | Disposition: A | Discharge status: Home / Self Care | Attending: Emergency Medicine | Admitting: Emergency Medicine

## 2023-08-16 DIAGNOSIS — L600 Ingrowing nail: Secondary | ICD-10-CM | POA: Diagnosis not present

## 2023-08-16 DIAGNOSIS — I10 Essential (primary) hypertension: Secondary | ICD-10-CM | POA: Diagnosis not present

## 2023-08-16 DIAGNOSIS — L03031 Cellulitis of right toe: Secondary | ICD-10-CM | POA: Insufficient documentation

## 2023-08-16 MED ORDER — LIDOCAINE HCL (PF) 1 % IJ SOLN
10.0000 mL | Freq: Once | INTRAMUSCULAR | Status: AC
  Administered 2023-08-16: 10 mL
  Filled 2023-08-16: qty 10

## 2023-08-16 MED ORDER — CEPHALEXIN 500 MG PO CAPS
500.0000 mg | ORAL_CAPSULE | Freq: Four times a day (QID) | ORAL | 0 refills | Status: DC
  Filled 2023-08-16: qty 20, 5d supply, fill #0

## 2023-08-16 MED ORDER — CEPHALEXIN 500 MG PO CAPS
500.0000 mg | ORAL_CAPSULE | Freq: Once | ORAL | Status: AC
  Administered 2023-08-16: 500 mg via ORAL
  Filled 2023-08-16: qty 1

## 2023-08-16 MED ORDER — CEPHALEXIN 500 MG PO CAPS: 500.0000 mg | ORAL_CAPSULE | Freq: Four times a day (QID) | ORAL | 0 refills | Status: AC

## 2023-08-16 NOTE — ED Provider Notes (Addendum)
 [facility] Provider Note    Event Date/Time   First MD Initiated Contact with Patient 08/16/23 [phone]     (approximate)   History   Toe Pain   HPI  Kelly Wallace is a 73 y.o. female   Past medical history of hypertension who presents with ingrown toenail on the right foot.  It is the medial side of the great toe on the right, has been bothering her for couple weeks and her nephew tried to remove it a week ago, since then there is some purulent discharge and skin changes on the toenail.  No systemic symptoms like fevers or chills.       Physical Exam   Triage Vital Signs: ED Triage Vitals [08/16/23 0526]  Encounter Vitals Group     BP (!) 164/75     Girls Systolic BP Percentile      Girls Diastolic BP Percentile      Boys Systolic BP Percentile      Boys Diastolic BP Percentile      Pulse Rate 78     Resp 18     Temp 97.8 F (36.6 C)     Temp Source Oral     SpO2 100 %     Weight 203 lb (92.1 kg)     Height 5' 4 (1.626 m)     Head Circumference      Peak Flow      Pain Score 8     Pain Loc      Pain Education      Exclude from Growth Chart     Most recent vital signs: Vitals:   08/16/23 0536 08/16/23 0600  BP:  (!) 145/75  Pulse:  72  Resp:  18  Temp:    SpO2: 100% 100%    General: Awake, no distress.  CV:  Good peripheral perfusion.  Resp:  Normal effort.  Abd:  No distention.  Other:  Ingrown toenail with scant purulence and redness surrounding the toenail right great toenail medial side.   ED Results / Procedures / Treatments   Labs (all labs ordered are listed, but only abnormal results are displayed) Labs Reviewed - No data to display   PROCEDURES:  Critical Care performed: No  Excise ingrown toenail  Date/Time: 08/16/2023 6:38 AM  Performed by: Cyrena Mylar, MD Authorized by: Cyrena Mylar, MD  Consent: Verbal consent obtained Risks and benefits: risks, benefits and alternatives were discussed Consent given  by: patient Patient identity confirmed: verbally with patient Preparation: Patient was prepped and draped in the usual sterile fashion. Local anesthesia used: yes Anesthesia: digital block  Anesthesia: Local anesthesia used: yes Local Anesthetic: lidocaine  1% without epinephrine  Anesthetic total: 7 mL  Sedation: Patient sedated: no  Patient tolerance: patient tolerated the procedure well with no immediate complications      MEDICATIONS ORDERED IN ED: Medications  lidocaine  (PF) (XYLOCAINE ) 1 % injection 10 mL (has no administration in time range)  cephALEXin  (KEFLEX ) capsule 500 mg (500 mg Oral Given 08/16/23 0622)     IMPRESSION / MDM / ASSESSMENT AND PLAN / ED COURSE  I reviewed the triage vital signs and the nursing notes.                                Patient's presentation is most consistent with acute complicated illness / injury requiring diagnostic workup.  Differential diagnosis includes, but is not limited  to, ingrown toenail, infection    MDM: Ingrown toenail removed proximal portion (distal portion removed partially by nephew a week ago but seemed to irritate/macerate some of the nailbed not amenable to repair, subsequently developed small amount of pus and cellulitis changes) and the removal in ED was uncomplicated and will follow-up with podiatrist.  Keflex  antibiotic given signs of cellulitis surrounding.  Discharge will f/u w Baker at podiatry and knows to call for future problems and not try to do it at home again       FINAL CLINICAL IMPRESSION(S) / ED DIAGNOSES   Final diagnoses:  Ingrown toenail  Cellulitis of great toe of right foot     Rx / DC Orders   ED Discharge Orders     None        Note:  This document was prepared using Dragon voice recognition software and may include unintentional dictation errors.    Cyrena Mylar, MD 08/16/23 [id number]    Cyrena Mylar, MD 08/16/23 [id number]    Cyrena Mylar, MD 08/16/23 [phone]

## 2023-08-16 NOTE — ED Notes (Signed)
 Pt verbalized understanding of discharge instructions. Opportunity for questions provided.   Pt's prescription changed to [facility] due to it being a weekend.

## 2023-08-16 NOTE — ED Triage Notes (Signed)
 Right great toe pain for several day. Suspects an ingrown nail.

## 2023-08-16 NOTE — Discharge Instructions (Addendum)
 Take antibiotics for the full course as prescribed to treat toenail infection.  See Dr. Lennie for follow-up appointment, of podiatry.  Thank you for choosing us  for your health care today!  Please see your primary doctor this week for a follow up appointment.   If you have any new, worsening, or unexpected symptoms call your doctor right away or come back to the emergency department for reevaluation.  It was my pleasure to care for you today.   Ginnie EDISON Cyrena, MD

## 2023-08-16 NOTE — ED Notes (Signed)
 Pt's toe bandaged

## 2023-08-27 DIAGNOSIS — M79671 Pain in right foot: Secondary | ICD-10-CM | POA: Diagnosis not present

## 2023-08-27 DIAGNOSIS — L600 Ingrowing nail: Secondary | ICD-10-CM | POA: Diagnosis not present

## 2023-08-28 ENCOUNTER — Inpatient Hospital Stay: Payer: Medicare Other | Attending: Oncology

## 2023-08-28 DIAGNOSIS — E611 Iron deficiency: Secondary | ICD-10-CM

## 2023-08-28 DIAGNOSIS — E538 Deficiency of other specified B group vitamins: Secondary | ICD-10-CM | POA: Insufficient documentation

## 2023-08-28 MED ORDER — CYANOCOBALAMIN 1000 MCG/ML IJ SOLN
1000.0000 ug | Freq: Once | INTRAMUSCULAR | Status: AC
  Administered 2023-08-28: 1000 ug via INTRAMUSCULAR
  Filled 2023-08-28: qty 1

## 2023-09-10 DIAGNOSIS — M79671 Pain in right foot: Secondary | ICD-10-CM | POA: Diagnosis not present

## 2023-09-10 DIAGNOSIS — L600 Ingrowing nail: Secondary | ICD-10-CM | POA: Diagnosis not present

## 2023-09-29 ENCOUNTER — Inpatient Hospital Stay: Payer: Medicare Other | Attending: Oncology

## 2023-09-29 DIAGNOSIS — E611 Iron deficiency: Secondary | ICD-10-CM

## 2023-09-29 DIAGNOSIS — E538 Deficiency of other specified B group vitamins: Secondary | ICD-10-CM | POA: Insufficient documentation

## 2023-09-29 MED ORDER — CYANOCOBALAMIN 1000 MCG/ML IJ SOLN
1000.0000 ug | Freq: Once | INTRAMUSCULAR | Status: AC
  Administered 2023-09-29: 1000 ug via INTRAMUSCULAR
  Filled 2023-09-29: qty 1

## 2023-10-28 ENCOUNTER — Encounter: Payer: Self-pay | Admitting: Oncology

## 2023-10-28 ENCOUNTER — Inpatient Hospital Stay: Payer: Medicare Other

## 2023-10-28 ENCOUNTER — Inpatient Hospital Stay: Payer: Medicare Other | Attending: Oncology | Admitting: Oncology

## 2023-10-28 VITALS — BP 139/61 | HR 74 | Temp 97.2°F | Resp 18 | Ht 64.0 in | Wt 215.0 lb

## 2023-10-28 DIAGNOSIS — D509 Iron deficiency anemia, unspecified: Secondary | ICD-10-CM | POA: Diagnosis present

## 2023-10-28 DIAGNOSIS — E611 Iron deficiency: Secondary | ICD-10-CM

## 2023-10-28 DIAGNOSIS — E538 Deficiency of other specified B group vitamins: Secondary | ICD-10-CM

## 2023-10-28 DIAGNOSIS — D518 Other vitamin B12 deficiency anemias: Secondary | ICD-10-CM | POA: Diagnosis not present

## 2023-10-28 DIAGNOSIS — Z79899 Other long term (current) drug therapy: Secondary | ICD-10-CM | POA: Diagnosis not present

## 2023-10-28 LAB — CBC
HCT: 38.7 % (ref 36.0–46.0)
Hemoglobin: 13 g/dL (ref 12.0–15.0)
MCH: 30.7 pg (ref 26.0–34.0)
MCHC: 33.6 g/dL (ref 30.0–36.0)
MCV: 91.3 fL (ref 80.0–100.0)
Platelets: 176 K/uL (ref 150–400)
RBC: 4.24 MIL/uL (ref 3.87–5.11)
RDW: 13.3 % (ref 11.5–15.5)
WBC: 5.3 K/uL (ref 4.0–10.5)
nRBC: 0 % (ref 0.0–0.2)

## 2023-10-28 LAB — FERRITIN: Ferritin: 32 ng/mL (ref 11–307)

## 2023-10-28 LAB — IRON AND TIBC
Iron: 89 ug/dL (ref 28–170)
Saturation Ratios: 26 % (ref 10.4–31.8)
TIBC: 344 ug/dL (ref 250–450)
UIBC: 255 ug/dL

## 2023-10-28 MED ORDER — CYANOCOBALAMIN 1000 MCG/ML IJ SOLN
1000.0000 ug | Freq: Once | INTRAMUSCULAR | Status: DC
  Filled 2023-10-28: qty 1

## 2023-10-28 NOTE — Progress Notes (Signed)
 Hematology/Oncology Consult note [facility]  [phone] [phone]  Patient Care Team: Glendia Shad, MD as PCP - General (Internal Medicine) Melanee Annah BROCKS, MD as Consulting Physician (Oncology)   Name of the patient: Kelly Wallace  [id number]  1950/05/21   Date of visit: 10/28/23  Diagnosis-history of iron and B12 deficiency anemia  Chief complaint/ Reason for visit-routine follow-up of anemia  Heme/Onc history:  patient is a 73 year old African-American female  who has been referred to us  for evaluation and management of anemia.  She has a history of gastric bypass surgery back in 2005.  She is not currently taking any iron supplements.     Recent CBC from 02/26/2017 showed white count of 5, H&H of 9.5/30.4 with an MCV of 78.7 and a platelet count of 232.  Ferritin levels were low at 4.9.  Of note ferritin was low at 7.45 months ago as well.  B12 levels were low at 204.  TSH was normal.  On review of her prior CBCs her hemoglobin has been between 9-10 for the last 1 year.  Borderline low MCV levels.  No other cytopenias.  Last colonoscopy in June 2013 showed internal hemorrhoids but no other abnormal findings. pateint had repeat EGD and colonoscopy inmarch 2019 which did not reveal any bleeding.s he received 2 doses of feraheme  in march 2019      Interval history- Kelly Wallace is a 73 year old female with iron deficiency who presents for follow-up on her condition.  She is currently receiving monthly B12 injections at the clinic. This treatment is ongoing. She has not required iron infusions for an extended period. Iron levels are pending.  No blood in stool or urine, and no dark, tarry stools. She reports good energy levels and has no complaints at this time.  ECOG PS- 1 Pain scale- 0   Review of systems- Review of Systems  Constitutional:  Negative for chills, fever, malaise/fatigue and weight loss.  HENT:  Negative for  congestion, ear discharge and nosebleeds.   Eyes:  Negative for blurred vision.  Respiratory:  Negative for cough, hemoptysis, sputum production, shortness of breath and wheezing.   Cardiovascular:  Negative for chest pain, palpitations, orthopnea and claudication.  Gastrointestinal:  Negative for abdominal pain, blood in stool, constipation, diarrhea, heartburn, melena, nausea and vomiting.  Genitourinary:  Negative for dysuria, flank pain, frequency, hematuria and urgency.  Musculoskeletal:  Negative for back pain, joint pain and myalgias.  Skin:  Negative for rash.  Neurological:  Negative for dizziness, tingling, focal weakness, seizures, weakness and headaches.  Endo/Heme/Allergies:  Does not bruise/bleed easily.  Psychiatric/Behavioral:  Negative for depression and suicidal ideas. The patient does not have insomnia.       No Known Allergies   Past Medical History:  Diagnosis Date   Anemia    not currently being treated for this   Arthritis    History of colon polyps    Hypertension      Past Surgical History:  Procedure Laterality Date   CHOLECYSTECTOMY  1989   COLONOSCOPY WITH PROPOFOL  N/A 04/10/2017   Procedure: COLONOSCOPY WITH PROPOFOL ;  Surgeon: Unk Corinn Skiff, MD;  Location: [facility] ENDOSCOPY;  Service: Gastroenterology;  Laterality: N/A;   ESOPHAGOGASTRODUODENOSCOPY (EGD) WITH PROPOFOL  N/A 04/10/2017   Procedure: ESOPHAGOGASTRODUODENOSCOPY (EGD) WITH PROPOFOL ;  Surgeon: Unk Corinn Skiff, MD;  Location: [facility] ENDOSCOPY;  Service: Gastroenterology;  Laterality: N/A;   GASTRIC BYPASS  03/21/03   JOINT REPLACEMENT Right 04/2014  TOTAL KNEE REPLACEMENT, DR. KATHLYNN, [facility]   TOTAL KNEE ARTHROPLASTY Left 02/27/2016   Procedure: TOTAL KNEE ARTHROPLASTY;  Surgeon: Ozell KATHLYNN, MD;  Location: [facility] ORS;  Service: Orthopedics;  Laterality: Left;   TUBAL LIGATION  1976    Social History   Socioeconomic History   Marital status: Married    Spouse name: Not on file   Number  of children: 2   Years of education: Not on file   Highest education level: Not on file  Occupational History    Employer: [employer]  Tobacco Use   Smoking status: Never   Smokeless tobacco: Never  Vaping Use   Vaping status: Never Used  Substance and Sexual Activity   Alcohol use: No    Alcohol/week: 0.0 standard drinks of alcohol   Drug use: No   Sexual activity: Yes  Other Topics Concern   Not on file  Social History Narrative   Not on file   Social Drivers of Health   Financial Resource Strain: Low Risk  (08/27/2023)   Received from [facility]   Overall Financial Resource Strain (CARDIA)    Difficulty of Paying Living Expenses: Not very hard  Food Insecurity: No Food Insecurity (08/27/2023)   [facility] from [facility]   Hunger Vital Sign    Within the past 12 months, you worried that your food would run out before you got the money to buy more.: Never true    Within the past 12 months, the food you bought just didn't last and you didn't have money to get more.: Never true  Transportation Needs: No Transportation Needs (08/27/2023)   Received from [facility] - Transportation    In the past 12 months, has lack of transportation kept you from medical appointments or from getting medications?: No    Lack of Transportation (Non-Medical): No  Physical Activity: Sufficiently Active (04/05/2022)   Exercise Vital Sign    Days of Exercise per Week: 5 days    Minutes of Exercise per Session: 30 min  Stress: No Stress Concern Present (04/05/2022)   [employer] of Occupational Health - Occupational Stress Questionnaire    Feeling of Stress : Not at all  Social Connections: Unknown (04/05/2022)   Social Connection and Isolation Panel    Frequency of Communication with Friends and Family: More than three times a week    Frequency of Social Gatherings with Friends and Family: More than three times a week    Attends Religious  Services: More than 4 times per year    Active Member of [employer] or Organizations: Yes    Attends Banker Meetings: More than 4 times per year    Marital Status: Not on file  Intimate Partner Violence: Not At Risk (04/05/2022)   Humiliation, Afraid, Rape, and Kick questionnaire    Fear of Current or Ex-Partner: No    Emotionally Abused: No    Physically Abused: No    Sexually Abused: No    Family History  Problem Relation Age of Onset   Hypertension Mother    Diabetes Mother    Heart disease Father    Cancer Maternal Aunt        question of type   Cancer Maternal Grandmother        colon   Breast cancer Cousin 27       maternal side     Current Outpatient Medications:    albuterol  (VENTOLIN  HFA) 108 (90  Base) MCG/ACT inhaler, Inhale 2 puffs into the lungs every 6 (six) hours as needed for wheezing or shortness of breath., Disp: 8 g, Rfl: 0   alendronate  (FOSAMAX ) 70 MG tablet, Take 1 tablet (70 mg total) by mouth every 7 (seven) days. Take with a full glass of water on an empty stomach., Disp: 12 tablet, Rfl: 3   amLODipine  (NORVASC ) 5 MG tablet, Take 1 tablet (5 mg total) by mouth daily., Disp: 90 tablet, Rfl: 1   polyethylene glycol powder (GLYCOLAX /MIRALAX ) 17 GM/SCOOP powder, Take 17 g by mouth daily. PRN, Disp: , Rfl:    potassium chloride  (KLOR-CON ) 10 MEQ tablet, Take 1 tablet (10 mEq total) by mouth every Monday, Wednesday, and Friday., Disp: 36 tablet, Rfl: 3   Vitamin D , Ergocalciferol , (DRISDOL ) 1.25 MG (50000 UNIT) CAPS capsule, Take 1 capsule (50,000 Units total) by mouth every 7 (seven) days., Disp: 12 capsule, Rfl: 1   Vitamin D , Ergocalciferol , (DRISDOL ) 1.25 MG (50000 UNIT) CAPS capsule, Take 1 capsule (50,000 Units total) by mouth every 7 (seven) days., Disp: 12 capsule, Rfl: 1   furosemide  (LASIX ) 20 MG tablet, Take 1 tablet (20 mg total) by mouth every Monday, Wednesday, and Friday. (Patient not taking: Reported on 07/22/2023), Disp: 36 tablet, Rfl: 3 No  current facility-administered medications for this visit.  Facility-Administered Medications Ordered in Other Visits:    cyanocobalamin  (VITAMIN B12) injection 1,000 mcg, 1,000 mcg, Intramuscular, Once, Geofm Delon BRAVO, NP  Physical exam:  Vitals:   10/28/23 1126  BP: 139/61  Pulse: 74  Resp: 18  Temp: (!) 97.2 F (36.2 C)  TempSrc: Tympanic  SpO2: 100%  Weight: 215 lb (97.5 kg)  Height: 5' 4 (1.626 m)   Physical Exam Cardiovascular:     Rate and Rhythm: Normal rate and regular rhythm.     Heart sounds: Normal heart sounds.  Pulmonary:     Effort: Pulmonary effort is normal.     Breath sounds: Normal breath sounds.  Skin:    General: Skin is warm and dry.  Neurological:     Mental Status: She is alert and oriented to person, place, and time.      I have personally reviewed labs listed below:    Latest Ref Rng & Units 07/22/2023   11:12 AM  CMP  Glucose 70 - 99 mg/dL 93   BUN 6 - 23 mg/dL 10   Creatinine 9.59 - 1.20 mg/dL 9.38   Sodium 864 - 854 mEq/L 140   Potassium 3.5 - 5.1 mEq/L 4.0   Chloride 96 - 112 mEq/L 108   CO2 19 - 32 mEq/L 26   Calcium 8.4 - 10.5 mg/dL 8.6   Total Protein 6.0 - 8.3 g/dL 6.9   Total Bilirubin 0.2 - 1.2 mg/dL 0.8   Alkaline Phos 39 - 117 U/L 113   AST 0 - 37 U/L 16   ALT 0 - 35 U/L 12       Latest Ref Rng & Units 10/28/2023   10:59 AM  CBC  WBC 4.0 - 10.5 K/uL 5.3   Hemoglobin 12.0 - 15.0 g/dL 86.9   Hematocrit 63.9 - 46.0 % 38.7   Platelets 150 - 400 K/uL 176      Assessment and plan- Patient is a 73 y.o. female here for routine follow-up of iron B12 deficiency anemia  Iron deficiency anemia Well-managed with normal hemoglobin. Iron levels pending. No recent iron infusions needed. No gastrointestinal bleeding signs. - Monitor iron levels every six months.  I will see her back in 1 year - Notify if iron infusions are needed.  Vitamin B12 deficiency Managed with monthly B12 injections. Responding well to treatment. -  Continue monthly B12 injections.   Visit Diagnosis 1. Iron deficiency   2. Other vitamin B12 deficiency anemia      Dr. Annah Skene, MD, MPH [facility] [phone] 10/28/2023 12:57 PM

## 2023-10-28 NOTE — Progress Notes (Signed)
 Patient left office without receiving B12 inj.  Will notify MD.

## 2023-10-28 NOTE — Progress Notes (Signed)
 Patient does not have any new or acute concerns at this time.

## 2023-11-04 ENCOUNTER — Encounter: Payer: Self-pay | Admitting: Nurse Practitioner

## 2023-11-04 ENCOUNTER — Ambulatory Visit (INDEPENDENT_AMBULATORY_CARE_PROVIDER_SITE_OTHER): Admitting: Nurse Practitioner

## 2023-11-04 ENCOUNTER — Other Ambulatory Visit: Payer: Self-pay

## 2023-11-04 VITALS — BP 142/78 | HR 81 | Temp 97.5°F | Ht 64.0 in | Wt 216.4 lb

## 2023-11-04 DIAGNOSIS — N3001 Acute cystitis with hematuria: Secondary | ICD-10-CM | POA: Diagnosis not present

## 2023-11-04 DIAGNOSIS — H66001 Acute suppurative otitis media without spontaneous rupture of ear drum, right ear: Secondary | ICD-10-CM | POA: Insufficient documentation

## 2023-11-04 DIAGNOSIS — R300 Dysuria: Secondary | ICD-10-CM | POA: Diagnosis not present

## 2023-11-04 LAB — POC URINALSYSI DIPSTICK (AUTOMATED)
Bilirubin, UA: NEGATIVE
Glucose, UA: NEGATIVE
Ketones, UA: NEGATIVE
Nitrite, UA: POSITIVE — AB
Protein, UA: POSITIVE — AB
Spec Grav, UA: 1.025 (ref 1.010–1.025)
Urobilinogen, UA: 1 U/dL
pH, UA: 6 (ref 5.0–8.0)

## 2023-11-04 LAB — URINALYSIS, ROUTINE W REFLEX MICROSCOPIC
Bilirubin Urine: NEGATIVE
Ketones, ur: NEGATIVE
Nitrite: POSITIVE — AB
Specific Gravity, Urine: 1.025 (ref 1.000–1.030)
Total Protein, Urine: 30 — AB
Urine Glucose: NEGATIVE
Urobilinogen, UA: 1 (ref 0.0–1.0)
pH: 6 (ref 5.0–8.0)

## 2023-11-04 MED ORDER — AMOXICILLIN-POT CLAVULANATE 875-125 MG PO TABS
1.0000 | ORAL_TABLET | Freq: Two times a day (BID) | ORAL | 0 refills | Status: DC
  Filled 2023-11-04: qty 20, 10d supply, fill #0

## 2023-11-04 NOTE — Assessment & Plan Note (Signed)
 Acute right otitis media with drainage and hearing difficultly. Start Augmentin twice daily. Advise against inserting objects into the ear canal, including Q-tips. Educate on the use of nasal spray for allergy-related symptoms. Return precautions given to patient.

## 2023-11-04 NOTE — Progress Notes (Signed)
 Leron Glance, NP-C Phone: [phone]  Kelly Wallace is a 73 y.o. female who presents today for UTI symptoms and right ear drainage.   Discussed the use of AI scribe software for clinical note transcription with the patient, who gave verbal consent to proceed.  History of Present Illness   Kelly Wallace is a 73 year old female who presents with symptoms of a urinary tract infection and ear drainage.  She has been experiencing symptoms suggestive of a urinary tract infection for approximately one week, including increased urinary frequency and urgency, accompanied by a slight burning sensation. No visible blood in urine, abdominal pain, fever, or back pain. She has not had a UTI in a long time. She recently started using a different scented soap, which she suspects might have contributed to her symptoms.  She reports ear drainage from her right ear for the past three days. The drainage was initially clear but has become cloudy and lumpy. No ear pain, but she notes a sensation of 'rumbling' and some hearing loss, which she attributes to a long-standing issue. No fever, but she mentions experiencing some cough and congestion, which she attributes to allergies. She does not use allergy medications or nasal sprays.      Social History   Tobacco Use  Smoking Status Never  Smokeless Tobacco Never    Current Outpatient Medications on File Prior to Visit  Medication Sig Dispense Refill   albuterol  (VENTOLIN  HFA) 108 (90 Base) MCG/ACT inhaler Inhale 2 puffs into the lungs every 6 (six) hours as needed for wheezing or shortness of breath. 8 g 0   alendronate  (FOSAMAX ) 70 MG tablet Take 1 tablet (70 mg total) by mouth every 7 (seven) days. Take with a full glass of water on an empty stomach. 12 tablet 3   amLODipine  (NORVASC ) 5 MG tablet Take 1 tablet (5 mg total) by mouth daily. 90 tablet 1   furosemide  (LASIX ) 20 MG tablet Take 1 tablet (20 mg total) by mouth every Monday, Wednesday, and Friday.  (Patient not taking: Reported on 07/22/2023) 36 tablet 3   polyethylene glycol powder (GLYCOLAX /MIRALAX ) 17 GM/SCOOP powder Take 17 g by mouth daily. PRN     potassium chloride  (KLOR-CON ) 10 MEQ tablet Take 1 tablet (10 mEq total) by mouth every Monday, Wednesday, and Friday. 36 tablet 3   Vitamin D , Ergocalciferol , (DRISDOL ) 1.25 MG (50000 UNIT) CAPS capsule Take 1 capsule (50,000 Units total) by mouth every 7 (seven) days. 12 capsule 1   Vitamin D , Ergocalciferol , (DRISDOL ) 1.25 MG (50000 UNIT) CAPS capsule Take 1 capsule (50,000 Units total) by mouth every 7 (seven) days. 12 capsule 1   No current facility-administered medications on file prior to visit.     ROS see history of present illness  Objective  Physical Exam Vitals:   11/04/23 1011  BP: (!) 142/78  Pulse: 81  Temp: (!) 97.5 F (36.4 C)  SpO2: 99%    BP Readings from Last 3 Encounters:  11/04/23 (!) 142/78  10/28/23 139/61  08/16/23 (!) 159/98   Wt Readings from Last 3 Encounters:  11/04/23 216 lb 6.4 oz (98.2 kg)  10/28/23 215 lb (97.5 kg)  08/16/23 203 lb (92.1 kg)    Physical Exam Constitutional:      General: She is not in acute distress.    Appearance: Normal appearance.  HENT:     Head: Normocephalic.     Right Ear: Decreased hearing noted. Tympanic membrane is erythematous and bulging.  Left Ear: Tympanic membrane normal.     Nose: Nose normal.     Mouth/Throat:     Mouth: Mucous membranes are moist.     Pharynx: Oropharynx is clear.  Eyes:     Conjunctiva/sclera: Conjunctivae normal.     Pupils: Pupils are equal, round, and reactive to light.  Cardiovascular:     Rate and Rhythm: Normal rate and regular rhythm.     Heart sounds: Normal heart sounds.  Pulmonary:     Effort: Pulmonary effort is normal.     Breath sounds: Normal breath sounds.  Abdominal:     General: Abdomen is flat. Bowel sounds are normal.     Palpations: Abdomen is soft. There is no mass.     Tenderness: There is no  abdominal tenderness.  Lymphadenopathy:     Cervical: No cervical adenopathy.  Skin:    General: Skin is warm and dry.  Neurological:     General: No focal deficit present.     Mental Status: She is alert.  Psychiatric:        Mood and Affect: Mood normal.        Behavior: Behavior normal.      Assessment/Plan: Please see individual problem list.  Acute cystitis with hematuria Assessment & Plan: Urinalysis confirmed a urinary tract infection with small blood, small leukocytes, and positive nitrites. Discussed potential complications if untreated, such as kidney infection or bacteremia. Prescribe Augmentin to cover both UTI and AOM. Send urine sample to lab for culture and sensitivity. Advise staying hydrated. Instruct to contact the office if symptoms do not improve or worsen. Educate on proper hygiene practices to prevent future UTIs, such as wiping front to back and avoiding scented bath products.   Orders: -     Amoxicillin -Pot Clavulanate; Take 1 tablet by mouth 2 (two) times daily.  Dispense: 20 tablet; Refill: 0  Non-recurrent acute suppurative otitis media of right ear without spontaneous rupture of tympanic membrane Assessment & Plan: Acute right otitis media with drainage and hearing difficultly. Start Augmentin twice daily. Advise against inserting objects into the ear canal, including Q-tips. Educate on the use of nasal spray for allergy-related symptoms. Return precautions given to patient.   Orders: -     Amoxicillin -Pot Clavulanate; Take 1 tablet by mouth 2 (two) times daily.  Dispense: 20 tablet; Refill: 0  Dysuria -     POCT Urinalysis Dipstick (Automated) -     Urinalysis, Routine w reflex microscopic -     Urine Culture      Return if symptoms worsen or fail to improve.   Leron Glance, NP-C [facility]

## 2023-11-04 NOTE — Assessment & Plan Note (Signed)
 Urinalysis confirmed a urinary tract infection with small blood, small leukocytes, and positive nitrites. Discussed potential complications if untreated, such as kidney infection or bacteremia. Prescribe Augmentin to cover both UTI and AOM. Send urine sample to lab for culture and sensitivity. Advise staying hydrated. Instruct to contact the office if symptoms do not improve or worsen. Educate on proper hygiene practices to prevent future UTIs, such as wiping front to back and avoiding scented bath products.

## 2023-11-07 ENCOUNTER — Ambulatory Visit: Payer: Self-pay | Admitting: Nurse Practitioner

## 2023-11-07 LAB — URINE CULTURE
MICRO NUMBER:: 17096974
SPECIMEN QUALITY:: ADEQUATE

## 2023-11-17 ENCOUNTER — Telehealth: Payer: Self-pay | Admitting: *Deleted

## 2023-11-17 ENCOUNTER — Ambulatory Visit: Admitting: *Deleted

## 2023-11-17 VITALS — Ht 64.0 in | Wt 213.0 lb

## 2023-11-17 DIAGNOSIS — Z0000 Encounter for general adult medical examination without abnormal findings: Secondary | ICD-10-CM | POA: Diagnosis not present

## 2023-11-17 DIAGNOSIS — Z1211 Encounter for screening for malignant neoplasm of colon: Secondary | ICD-10-CM

## 2023-11-17 NOTE — Telephone Encounter (Addendum)
 Performed AWV  While reviewing medications was advised that she has not been taking Fosamax , furosemide , potassium or Vitamin D  50.000. Patient stated that she is not sure how long she hasn't taken them but it has been a while. Patient stated if she is to continue them she will need refills sent to the pharmacy.   Patient stated the last time she was in the office she was told that a referral was going to be done for her to have a colonoscopy but she has not received a call yet.   Pharmacy [facility] [place]

## 2023-11-17 NOTE — Telephone Encounter (Signed)
 Performed AWV  While reviewing medications was advised that she has not been taking Fosamax , furosemide , potassium or Vitamin D  50.000. Patient stated that she is not sure how long she hasn't taken them but it has been a while. Patient stated if she is to continue them she will need refills sent to the pharmacy.   Patient stated the last time she was in the office she was told that a referral was going to be done for her to have a colonoscopy but she has not received a call yet.   Pharmacy [facility]

## 2023-11-17 NOTE — Telephone Encounter (Signed)
 Duplicate note

## 2023-11-17 NOTE — Patient Instructions (Addendum)
 Ms. Kelly Wallace,  Thank you for taking the time for your Medicare Wellness Visit. I appreciate your continued commitment to your health goals. Please review the care plan we discussed, and feel free to reach out if I can assist you further.  Medicare recommends these wellness visits once per year to help you and your care team stay ahead of potential health issues. These visits are designed to focus on prevention, allowing your provider to concentrate on managing your acute and chronic conditions during your regular appointments.  Please note that Annual Wellness Visits do not include a physical exam. Some assessments may be limited, especially if the visit was conducted virtually. If needed, we may recommend a separate in-person follow-up with your provider.  Ongoing Care Seeing your primary care provider every 3 to 6 months helps us  monitor your health and provide consistent, personalized care.  Remember to get your covid, tetanus (Tdap), shingles and pneumonia vaccines which you plan on getting at [facility]. .  You should hear something from the office regarding a referral for a colonoscopy.  Referrals If a referral was made during today's visit and you haven't received any updates within two weeks, please contact the referred provider directly to check on the status.  Recommended Screenings:  Health Maintenance  Topic Date Due   DTaP/Tdap/Td vaccine (1 - Tdap) Never done   Zoster (Shingles) Vaccine (1 of 2) Never done   Pneumococcal Vaccine for age over 24 (1 of 1 - PCV) Never done   Colon Cancer Screening  04/11/2022   COVID-19 Vaccine (4 - 2025-26 season) 09/22/2023   Breast Cancer Screening  05/29/2024   Medicare Annual Wellness Visit  11/16/2024   Flu Shot  Completed   DEXA scan (bone density measurement)  Completed   Hepatitis C Screening  Completed   Meningitis B Vaccine  Aged Out       11/17/2023   11:53 AM  Advanced Directives  Does Patient Have a Medical Advance Directive? No   Would patient like information on creating a medical advance directive? No - Patient declined   Advance Care Planning is important because it: Ensures you receive medical care that aligns with your values, goals, and preferences. Provides guidance to your family and loved ones, reducing the emotional burden of decision-making during critical moments.  Vision: Annual vision screenings are recommended for early detection of glaucoma, cataracts, and diabetic retinopathy. These exams can also reveal signs of chronic conditions such as diabetes and high blood pressure.  Dental: Annual dental screenings help detect early signs of oral cancer, gum disease, and other conditions linked to overall health, including heart disease and diabetes.  Please see the attached documents for additional preventive care recommendations.

## 2023-11-17 NOTE — Progress Notes (Signed)
 Subjective:   Kelly Wallace is a 73 y.o. who presents for a Medicare Wellness preventive visit.  As a reminder, Annual Wellness Visits don't include a physical exam, and some assessments may be limited, especially if this visit is performed virtually. We may recommend an in-person follow-up visit with your provider if needed.  Visit Complete: Virtual I connected with  Kelly Wallace on 11/17/23 by a audio enabled telemedicine application and verified that I am speaking with the correct person using two identifiers.  Patient Location: Home  Provider Location: Home Office  I discussed the limitations of evaluation and management by telemedicine. The patient expressed understanding and agreed to proceed.  Vital Signs: Because this visit was a virtual/telehealth visit, some criteria may be missing or patient reported. Any vitals not documented were not able to be obtained and vitals that have been documented are patient reported.  VideoDeclined- This patient declined Librarian, academic. Therefore the visit was completed with audio only.  Persons Participating in Visit: Patient.  AWV Questionnaire: No: Patient Medicare AWV questionnaire was not completed prior to this visit.  Cardiac Risk Factors include: advanced age (>47men, >88 women);obesity (BMI >30kg/m2);hypertension     Objective:    Today's Vitals   11/17/23 1137  Weight: 213 lb (96.6 kg)  Height: 5' 4 (1.626 m)   Body mass index is 36.56 kg/m.     11/17/2023   11:53 AM 10/28/2023   11:25 AM 08/16/2023    5:27 AM 02/02/2023   12:06 PM 10/28/2022   11:32 AM 10/23/2021    1:05 PM 08/26/2021    3:26 AM  Advanced Directives  Does Patient Have a Medical Advance Directive? No No No No No No   Would patient like information on creating a medical advance directive? No - Patient declined No - Patient declined No - Patient declined  No - Patient declined No - Patient declined No - Patient declined     Current Medications (verified) Outpatient Encounter Medications as of 11/17/2023  Medication Sig   albuterol  (VENTOLIN  HFA) 108 (90 Base) MCG/ACT inhaler Inhale 2 puffs into the lungs every 6 (six) hours as needed for wheezing or shortness of breath.   alendronate  (FOSAMAX ) 70 MG tablet Take 1 tablet (70 mg total) by mouth every 7 (seven) days. Take with a full glass of water on an empty stomach.   amLODipine  (NORVASC ) 5 MG tablet Take 1 tablet (5 mg total) by mouth daily.   furosemide  (LASIX ) 20 MG tablet Take 1 tablet (20 mg total) by mouth every Monday, Wednesday, and Friday.   polyethylene glycol powder (GLYCOLAX /MIRALAX ) 17 GM/SCOOP powder Take 17 g by mouth daily. PRN   potassium chloride  (KLOR-CON ) 10 MEQ tablet Take 1 tablet (10 mEq total) by mouth every Monday, Wednesday, and Friday.   Vitamin D , Ergocalciferol , (DRISDOL ) 1.25 MG (50000 UNIT) CAPS capsule Take 1 capsule (50,000 Units total) by mouth every 7 (seven) days.   Vitamin D , Ergocalciferol , (DRISDOL ) 1.25 MG (50000 UNIT) CAPS capsule Take 1 capsule (50,000 Units total) by mouth every 7 (seven) days. (Patient not taking: Reported on 11/17/2023)   [DISCONTINUED] amoxicillin -clavulanate (AUGMENTIN) 875-125 MG tablet Take 1 tablet by mouth 2 (two) times daily. (Patient not taking: Reported on 11/17/2023)   No facility-administered encounter medications on file as of 11/17/2023.    Allergies (verified) Patient has no known allergies.   History: Past Medical History:  Diagnosis Date   Anemia    not currently being treated for this  Arthritis    History of colon polyps    Hypertension    Past Surgical History:  Procedure Laterality Date   CHOLECYSTECTOMY  1989   COLONOSCOPY WITH PROPOFOL  N/A 04/10/2017   Procedure: COLONOSCOPY WITH PROPOFOL ;  Surgeon: Unk Corinn Skiff, MD;  Location: [facility] ENDOSCOPY;  Service: Gastroenterology;  Laterality: N/A;   ESOPHAGOGASTRODUODENOSCOPY (EGD) WITH PROPOFOL  N/A 04/10/2017    Procedure: ESOPHAGOGASTRODUODENOSCOPY (EGD) WITH PROPOFOL ;  Surgeon: Unk Corinn Skiff, MD;  Location: [facility] ENDOSCOPY;  Service: Gastroenterology;  Laterality: N/A;   GASTRIC BYPASS  03/21/03   JOINT REPLACEMENT Right 04/2014   TOTAL KNEE REPLACEMENT, DR. KATHLYNN, [facility]   TOTAL KNEE ARTHROPLASTY Left 02/27/2016   Procedure: TOTAL KNEE ARTHROPLASTY;  Surgeon: Ozell Kathlynn, MD;  Location: [facility] ORS;  Service: Orthopedics;  Laterality: Left;   TUBAL LIGATION  1976   Family History  Problem Relation Age of Onset   Hypertension Mother    Diabetes Mother    Heart disease Father    Cancer Maternal Aunt        question of type   Cancer Maternal Grandmother        colon   Breast cancer Cousin 15       maternal side   Social History   Socioeconomic History   Marital status: Married    Spouse name: Not on file   Number of children: 2   Years of education: Not on file   Highest education level: Not on file  Occupational History    Employer: [employer]  Tobacco Use   Smoking status: Never   Smokeless tobacco: Never  Vaping Use   Vaping status: Never Used  Substance and Sexual Activity   Alcohol use: No    Alcohol/week: 0.0 standard drinks of alcohol   Drug use: No   Sexual activity: Yes  Other Topics Concern   Not on file  Social History Narrative   married   Social Drivers of Corporate Investment Banker Strain: Low Risk  (11/17/2023)   Overall Financial Resource Strain (CARDIA)    Difficulty of Paying Living Expenses: Not hard at all  Food Insecurity: No Food Insecurity (11/17/2023)   Hunger Vital Sign    Worried About Running Out of Food in the Last Year: Never true    Ran Out of Food in the Last Year: Never true  Transportation Needs: No Transportation Needs (11/17/2023)   PRAPARE - Administrator, Civil Service (Medical): No    Lack of Transportation (Non-Medical): No  Physical Activity: Inactive (11/17/2023)   Exercise Vital Sign    Days of Exercise per Week: 0 days     Minutes of Exercise per Session: 0 min  Stress: No Stress Concern Present (11/17/2023)   [employer] of Occupational Health - Occupational Stress Questionnaire    Feeling of Stress: Not at all  Social Connections: Socially Integrated (11/17/2023)   Social Connection and Isolation Panel    Frequency of Communication with Friends and Family: More than three times a week    Frequency of Social Gatherings with Friends and Family: More than three times a week    Attends Religious Services: More than 4 times per year    Active Member of [employer] or Organizations: Yes    Attends Engineer, Structural: More than 4 times per year    Marital Status: Married    Tobacco Counseling Counseling given: Not Answered    Clinical Intake:  Pre-visit preparation completed: Yes  Pain : No/denies pain  BMI - recorded: 36.56 Nutritional Status: BMI > 30  Obese Nutritional Risks: None Diabetes: No  Lab Results  Component Value Date   HGBA1C 5.6 03/25/2019   HGBA1C 5.9 02/26/2017   HGBA1C 5.8 08/23/2016     How often do you need to have someone help you when you read instructions, pamphlets, or other written materials from your doctor or pharmacy?: 1 - Never  Interpreter Needed?: No  Information entered by :: R. Marino Rogerson LPN   Activities of Daily Living     11/17/2023   11:38 AM  In your present state of health, do you have any difficulty performing the following activities:  Hearing? 1  Vision? 0  Difficulty concentrating or making decisions? 0  Walking or climbing stairs? 1  Dressing or bathing? 0  Doing errands, shopping? 0  Preparing Food and eating ? N  Using the Toilet? N  In the past six months, have you accidently leaked urine? Y  Do you have problems with loss of bowel control? Y  Managing your Medications? N  Managing your Finances? N  Housekeeping or managing your Housekeeping? N    Patient Care Team: Malka Domino, MD as PCP - General  (Pulmonary Disease) Melanee Annah BROCKS, MD as Consulting Physician (Oncology)  I have updated your Care Teams any recent Medical Services you may have received from other providers in the past year.     Assessment:   This is a routine wellness examination for Kelly Wallace.  Hearing/Vision screen Hearing Screening - Comments:: Some issues but no aids Vision Screening - Comments:: glasses   Goals Addressed             This Visit's Progress    Patient Stated       Wants to try and eat healthy       Depression Screen     11/17/2023   11:49 AM 11/04/2023    9:58 AM 10/28/2023   11:26 AM 07/22/2023   10:44 AM 04/05/2022    1:15 PM 09/14/2021    1:11 PM 07/18/2021   12:32 PM  PHQ 2/9 Scores  PHQ - 2 Score 0 0 0 0 0 0 0  PHQ- 9 Score 0 0  0       Fall Risk     11/17/2023   11:41 AM 11/04/2023    9:58 AM 07/22/2023   10:43 AM 04/05/2022    1:17 PM 09/14/2021    1:11 PM  Fall Risk   Falls in the past year? 0 0 0 0 0  Number falls in past yr: 0 0 0 0 0  Injury with Fall? 0 0 0 0 0  Risk for fall due to : No Fall Risks  No Fall Risks  No Fall Risks  Follow up Falls evaluation completed;Falls prevention discussed Falls evaluation completed Falls evaluation completed Falls evaluation completed;Falls prevention discussed Falls evaluation completed      Data saved with a previous flowsheet row definition    MEDICARE RISK AT HOME:  Medicare Risk at Home Any stairs in or around the home?: Yes If so, are there any without handrails?: No Home free of loose throw rugs in walkways, pet beds, electrical cords, etc?: Yes Adequate lighting in your home to reduce risk of falls?: Yes Life alert?: No Use of a cane, walker or w/c?: Yes Grab bars in the bathroom?: No Shower chair or bench in shower?: No Elevated toilet seat or a handicapped toilet?: No  TIMED UP AND GO:  Was the test performed?  No  Cognitive Function: 6CIT completed        11/17/2023   11:53 AM 04/05/2022    1:19 PM  01/06/2019   10:40 AM  6CIT Screen  What Year? 0 points 0 points 0 points  What month? 0 points 0 points 0 points  What time? 0 points 0 points 0 points  Count back from 20 0 points 0 points 0 points  Months in reverse 0 points 0 points 2 points  Repeat phrase 0 points 0 points 2 points  Total Score 0 points 0 points 4 points    Immunizations Immunization History  Administered Date(s) Administered   Fluad Quad(high Dose 65+) 10/22/2019, 11/04/2021   INFLUENZA, HIGH DOSE SEASONAL PF 09/09/2020, 11/12/2022   Influenza Split 10/27/2012, 10/21/2013   Moderna Sars-Covid-2 Vaccination 02/24/2019, 03/25/2019, 12/06/2019    Screening Tests Health Maintenance  Topic Date Due   DTaP/Tdap/Td (1 - Tdap) Never done   Zoster Vaccines- Shingrix (1 of 2) Never done   Pneumococcal Vaccine: 50+ Years (1 of 1 - PCV) Never done   Colonoscopy  04/11/2022   Medicare Annual Wellness (AWV)  04/05/2023   Influenza Vaccine  08/22/2023   COVID-19 Vaccine (4 - 2025-26 season) 09/22/2023   Mammogram  05/29/2024   DEXA SCAN  Completed   Hepatitis C Screening  Completed   Meningococcal B Vaccine  Aged Out    Health Maintenance Items Addressed: Discussed the need to update covid, tetanus (Tdap), shingles and pneumonia vaccines which patient stated that she will get at [facility]. Patient stated when she was last in a referral was suppose to be done for a colonoscopy and she has not heard anything from the office. Will send note to PCP  Additional Screening:  Vision Screening: Recommended annual ophthalmology exams for early detection of glaucoma and other disorders of the eye. Is the patient up to date with their annual eye exam?  Yes  Who is the provider or what is the name of the office in which the patient attends annual eye exams?   Dental Screening: Recommended annual dental exams for proper oral hygiene  Community Resource Referral / Chronic Care Management: CRR required this visit?  No   CCM  required this visit?  No   Plan:    I have personally reviewed and noted the following in the patient's chart:   Medical and social history Use of alcohol, tobacco or illicit drugs  Current medications and supplements including opioid prescriptions. Patient is not currently taking opioid prescriptions. Functional ability and status Nutritional status Physical activity Advanced directives List of other physicians Hospitalizations, surgeries, and ER visits in previous 12 months Vitals Screenings to include cognitive, depression, and falls Referrals and appointments  In addition, I have reviewed and discussed with patient certain preventive protocols, quality metrics, and best practice recommendations. A written personalized care plan for preventive services as well as general preventive health recommendations were provided to patient.   Angeline Fredericks, LPN   [id number]   After Visit Summary: (MyChart) Due to this being a telephonic visit, the after visit summary with patients personalized plan was offered to patient via MyChart   Notes: Nothing significant to report at this time. Phone note sent to PCP

## 2023-11-18 NOTE — Telephone Encounter (Signed)
 Spoke to pt. Pt denied having any symptoms stated she didn't have any questions about her meds. Pt has been off the laxix and potassium since last visit. Bp has been good

## 2023-11-18 NOTE — Telephone Encounter (Signed)
 Order placed for GI referral. See other note for information regarding medication.

## 2023-11-18 NOTE — Telephone Encounter (Signed)
 Per note she was off lasix  and potassium last visit. Need to know how her blood pressures are doing and confirm doing ok. Have her spot check her pressures. She has an appt scheduled with me next week. Bring readings to appt. If any acute issues, will need to be seen prior

## 2023-11-18 NOTE — Telephone Encounter (Signed)
 Glendia Shad, MD to [facility] (Selected Message)    11/18/23 12:48 PM Note Per note she was off lasix  and potassium last visit. Need to know how her blood pressures are doing and confirm doing ok. Have her spot check her pressures. She has an appt scheduled with me next week. Bring readings to appt. If any acute issues, will need to be seen prior

## 2023-11-24 ENCOUNTER — Encounter: Payer: Self-pay | Admitting: Internal Medicine

## 2023-11-24 ENCOUNTER — Other Ambulatory Visit: Payer: Self-pay

## 2023-11-24 ENCOUNTER — Ambulatory Visit: Admitting: Internal Medicine

## 2023-11-24 VITALS — BP 134/82 | HR 76 | Temp 97.8°F | Ht 64.0 in | Wt 215.1 lb

## 2023-11-24 DIAGNOSIS — E559 Vitamin D deficiency, unspecified: Secondary | ICD-10-CM | POA: Diagnosis not present

## 2023-11-24 DIAGNOSIS — E611 Iron deficiency: Secondary | ICD-10-CM

## 2023-11-24 DIAGNOSIS — E538 Deficiency of other specified B group vitamins: Secondary | ICD-10-CM | POA: Diagnosis not present

## 2023-11-24 DIAGNOSIS — R195 Other fecal abnormalities: Secondary | ICD-10-CM

## 2023-11-24 DIAGNOSIS — Z860100 Personal history of colon polyps, unspecified: Secondary | ICD-10-CM | POA: Diagnosis not present

## 2023-11-24 DIAGNOSIS — I10 Essential (primary) hypertension: Secondary | ICD-10-CM

## 2023-11-24 DIAGNOSIS — R197 Diarrhea, unspecified: Secondary | ICD-10-CM

## 2023-11-24 LAB — LIPID PANEL
Cholesterol: 95 mg/dL (ref 0–200)
HDL: 53.1 mg/dL (ref >39.00)
LDL Cholesterol: 32 mg/dL (ref 0–99)
NonHDL: 42.01
Total CHOL/HDL Ratio: 2
Triglycerides: 48 mg/dL (ref 0.0–149.0)
VLDL: 9.6 mg/dL (ref 0.0–40.0)

## 2023-11-24 LAB — HEPATIC FUNCTION PANEL
ALT: 12 U/L (ref 0–35)
AST: 13 U/L (ref 0–37)
Albumin: 3.8 g/dL (ref 3.5–5.2)
Alkaline Phosphatase: 124 U/L — ABNORMAL HIGH (ref 39–117)
Bilirubin, Direct: 0.2 mg/dL (ref 0.0–0.3)
Total Bilirubin: 0.6 mg/dL (ref 0.2–1.2)
Total Protein: 6.5 g/dL (ref 6.0–8.3)

## 2023-11-24 LAB — BASIC METABOLIC PANEL WITH GFR
BUN: 13 mg/dL (ref 6–23)
CO2: 27 meq/L (ref 19–32)
Calcium: 8.4 mg/dL (ref 8.4–10.5)
Chloride: 109 meq/L (ref 96–112)
Creatinine, Ser: 0.6 mg/dL (ref 0.40–1.20)
GFR: 90.26 mL/min (ref >60.00)
Glucose, Bld: 88 mg/dL (ref 70–99)
Potassium: 4.3 meq/L (ref 3.5–5.1)
Sodium: 142 meq/L (ref 135–145)

## 2023-11-24 LAB — VITAMIN D 25 HYDROXY (VIT D DEFICIENCY, FRACTURES): VITD: <7 ng/mL — ABNORMAL LOW (ref 30.00–100.00)

## 2023-11-24 MED ORDER — AMLODIPINE BESYLATE 5 MG PO TABS
5.0000 mg | ORAL_TABLET | Freq: Every day | ORAL | 1 refills | Status: DC
  Filled 2023-11-24: qty 90, 90d supply, fill #0

## 2023-11-24 NOTE — Progress Notes (Unsigned)
 Subjective:    [name], female    DOB: September 17, 1950, 73 y.o.   MRN: [id number]  Patient here for  Chief Complaint  Patient presents with   Medical Management of Chronic Issues    4 mth f/u    HPI Here for a scheduled follow up - follow up regarding hypertension. Continues on amlodipine  for blood pressure. Saw Dr Gollan 08/2021 for evaluation - sob with exertion. ECHO 11/2021 - EF 60-65% with G1DD. Mild to moderate regurgitation of tricuspid valve. Mildly elevated right heart pressures. Recommended lasix  20mg  three times per week with potassium. Also noted borderline dilatation of the ascending aorta. Saw Dr Assakar 08/26/22 - recommended PFTs, CT chest. Continue albuterol  prn. Had f/u 11/2022 - high res CT normal. PFTs - slightly reduced FVC and increased ratio in the setting of obesity. Had f/u with Dr Melanee 10/28/23 - f/u IDA. No recent iron infusions needed per report. Continue b12 injections. Due colonoscopy. Discussed agreeable for referral. She does report she has been having gel like stools. Present x 1-2 weeks. Some loose stool. No abdominal pain. No nausea or vomiting. Recently on abx for ear infection and UTI. Reports blood pressures - outside checks - mostly averaging 120/80-84 - 145/80s.    Past Medical History:  Diagnosis Date   Anemia    not currently being treated for this   Arthritis    History of colon polyps    Hypertension    Past Surgical History:  Procedure Laterality Date   CHOLECYSTECTOMY  1989   COLONOSCOPY WITH PROPOFOL  N/A 04/10/2017   Procedure: COLONOSCOPY WITH PROPOFOL ;  Surgeon: Unk Corinn Skiff, MD;  Location[facility] ENDOSCOPY;  Service: Gastroenterology;  Laterality: N/A;   ESOPHAGOGASTRODUODENOSCOPY (EGD) WITH PROPOFOL  N/A 04/10/2017   Procedure: ESOPHAGOGASTRODUODENOSCOPY (EGD) WITH PROPOFOL ;  Surgeon: Unk Corinn Skiff, MD;  Location: [facility] ENDOSCOPY;  Service: Gastroenterology;  Laterality: N/A;   GASTRIC BYPASS  03/21/03   JOINT  REPLACEMENT Right 04/2014   TOTAL KNEE REPLACEMENT, DR. KATHLYNN, [facility]   TOTAL KNEE ARTHROPLASTY Left 02/27/2016   Procedure: TOTAL KNEE ARTHROPLASTY;  Surgeon: Ozell Kathlynn, MD;  Location: [facility] ORS;  Service: Orthopedics;  Laterality: Left;   TUBAL LIGATION  1976   Family History  Problem Relation Age of Onset   Hypertension Mother    Diabetes Mother    Heart disease Father    Cancer Maternal Aunt        question of type   Cancer Maternal Grandmother        colon   Breast cancer Cousin 58       maternal side   Social History   Socioeconomic History   Marital status: Married    Spouse name: Not on file   Number of children: 2   Years of education: Not on file   Highest education level: Not on file  Occupational History    Employer: [employer]  Tobacco Use   Smoking status: Never   Smokeless tobacco: Never  Vaping Use   Vaping status: Never Used  Substance and Sexual Activity   Alcohol use: No    Alcohol/week: 0.0 standard drinks of alcohol   Drug use: No   Sexual activity: Yes  Other Topics Concern   Not on file  Social History Narrative   married   Social Drivers of Corporate Investment Banker Strain: Low Risk  (11/17/2023)   Overall Financial Resource Strain (CARDIA)    Difficulty of Paying Living Expenses: Not hard at all  Food Insecurity: No Food Insecurity (11/17/2023)   Hunger Vital Sign    Worried About Running Out of Food in the Last Year: Never true    Ran Out of Food in the Last Year: Never true  Transportation Needs: No Transportation Needs (11/17/2023)   PRAPARE - Administrator, Civil Service (Medical): No    Lack of Transportation (Non-Medical): No  Physical Activity: Inactive (11/17/2023)   Exercise Vital Sign    Days of Exercise per Week: 0 days    Minutes of Exercise per Session: 0 min  Stress: No Stress Concern Present (11/17/2023)   [employer] of Occupational Health - Occupational Stress Questionnaire    Feeling of Stress: Not at  all  Social Connections: Socially Integrated (11/17/2023)   Social Connection and Isolation Panel    Frequency of Communication with Friends and Family: More than three times a week    Frequency of Social Gatherings with Friends and Family: More than three times a week    Attends Religious Services: More than 4 times per year    Active Member of [employer] or Organizations: Yes    Attends Engineer, Structural: More than 4 times per year    Marital Status: Married     Review of Systems  Constitutional:  Negative for appetite change, fever and unexpected weight change.  HENT:  Negative for congestion and sinus pressure.   Respiratory:  Negative for cough, chest tightness and shortness of breath.   Cardiovascular:  Negative for chest pain, palpitations and leg swelling.  Gastrointestinal:  Negative for abdominal pain, nausea and vomiting.       Gel like stool as outlined.   Genitourinary:  Negative for difficulty urinating and dysuria.  Musculoskeletal:  Negative for joint swelling and myalgias.  Skin:  Negative for color change and rash.  Neurological:  Negative for dizziness and headaches.  Psychiatric/Behavioral:  Negative for agitation and dysphoric mood.        Objective:     BP 134/82   Pulse 76   Temp 97.8 F (36.6 C) (Oral)   Ht 5' 4 (1.626 m)   Wt 215 lb 2 oz (97.6 kg)   LMP 04/06/2009   SpO2 97%   BMI 36.93 kg/m  Wt Readings from Last 3 Encounters:  11/24/23 215 lb 2 oz (97.6 kg)  11/17/23 213 lb (96.6 kg)  11/04/23 216 lb 6.4 oz (98.2 kg)    Physical Exam Vitals reviewed.  Constitutional:      General: She is not in acute distress.    Appearance: Normal appearance.  HENT:     Head: Normocephalic and atraumatic.     Right Ear: External ear normal.     Left Ear: External ear normal.     Mouth/Throat:     Pharynx: No oropharyngeal exudate or posterior oropharyngeal erythema.  Eyes:     General: No scleral icterus.       Right eye: No discharge.         Left eye: No discharge.     Conjunctiva/sclera: Conjunctivae normal.  Neck:     Thyroid : No thyromegaly.  Cardiovascular:     Rate and Rhythm: Normal rate and regular rhythm.  Pulmonary:     Effort: No respiratory distress.     Breath sounds: Normal breath sounds. No wheezing.  Abdominal:     General: Bowel sounds are normal.     Palpations: Abdomen is soft.     Tenderness: There is no abdominal tenderness.  Musculoskeletal:        General: No swelling or tenderness.     Cervical back: Neck supple. No tenderness.  Lymphadenopathy:     Cervical: No cervical adenopathy.  Skin:    Findings: No erythema or rash.  Neurological:     Mental Status: She is alert.  Psychiatric:        Mood and Affect: Mood normal.        Behavior: Behavior normal.         Outpatient Encounter Medications as of 11/24/2023  Medication Sig   albuterol  (VENTOLIN  HFA) 108 (90 Base) MCG/ACT inhaler Inhale 2 puffs into the lungs every 6 (six) hours as needed for wheezing or shortness of breath.   alendronate  (FOSAMAX ) 70 MG tablet Take 1 tablet (70 mg total) by mouth every 7 (seven) days. Take with a full glass of water on an empty stomach.   polyethylene glycol powder (GLYCOLAX /MIRALAX ) 17 GM/SCOOP powder Take 17 g by mouth daily. PRN   [DISCONTINUED] amLODipine  (NORVASC ) 5 MG tablet Take 1 tablet (5 mg total) by mouth daily.   [DISCONTINUED] Vitamin D , Ergocalciferol , (DRISDOL ) 1.25 MG (50000 UNIT) CAPS capsule Take 1 capsule (50,000 Units total) by mouth every 7 (seven) days.   amLODipine  (NORVASC ) 5 MG tablet Take 1 tablet (5 mg total) by mouth daily.   [DISCONTINUED] furosemide  (LASIX ) 20 MG tablet Take 1 tablet (20 mg total) by mouth every Monday, Wednesday, and Friday.   [DISCONTINUED] potassium chloride  (KLOR-CON ) 10 MEQ tablet Take 1 tablet (10 mEq total) by mouth every Monday, Wednesday, and Friday.   [DISCONTINUED] Vitamin D , Ergocalciferol , (DRISDOL ) 1.25 MG (50000 UNIT) CAPS capsule Take 1  capsule (50,000 Units total) by mouth every 7 (seven) days. (Patient not taking: Reported on 11/17/2023)   No facility-administered encounter medications on file as of 11/24/2023.     Lab Results  Component Value Date   WBC 5.3 10/28/2023   HGB 13.0 10/28/2023   HCT 38.7 10/28/2023   PLT 176 10/28/2023   GLUCOSE 88 11/24/2023   CHOL 95 11/24/2023   TRIG 48.0 11/24/2023   HDL 53.10 11/24/2023   LDLCALC 32 11/24/2023   ALT 12 11/24/2023   AST 13 11/24/2023   NA 142 11/24/2023   K 4.3 11/24/2023   CL 109 11/24/2023   CREATININE 0.60 11/24/2023   BUN 13 11/24/2023   CO2 27 11/24/2023   TSH 0.91 07/22/2023   INR 1.14 02/21/2016   HGBA1C 5.6 03/25/2019       Assessment & Plan:  Vitamin D  deficiency Assessment & Plan: Check vitamin D  level today.   Orders: -     VITAMIN D  25 Hydroxy (Vit-D Deficiency, Fractures)  Essential hypertension, benign Assessment & Plan: Continue amlodipine . Blood pressure as outlined. Recheck 134/82. No changes in medication. Follow pressures. Follow metabolic panel.   Orders: -     Basic metabolic panel with GFR -     Hepatic function panel -     Lipid panel  B12 deficiency Assessment & Plan: Continue b12 injections.    History of colonic polyps Assessment & Plan: Colonoscopy 04/10/17 - Non-thrombosed external hemorrhoids found on perianal exam. The examined portion of the ileum was normal. One 5 mm polyp in the transverse colon, removed with a cold snare. Resected and retrieved. Non-bleeding external hemorrhoids. Recommended f/u in 5 years (Dr Unk). Discussed due f/u colonoscopy. Agreeable for referral.   Orders: -     Ambulatory referral to Gastroenterology  Mucus in stool Assessment & Plan:  Gel like stool as outlined. Recent abx use. Check stool for c.diff. probiotics daily. Follow.   Orders: -     C Difficile Quick Screen w PCR reflex; Future  Diarrhea, unspecified type Assessment & Plan: Loose stool - gel like stool as  outlined. Check stool fo rc.diff. probiotic daily.   Orders: -     C Difficile Quick Screen w PCR reflex; Future  Iron deficiency Assessment & Plan: Had f/u with Dr Melanee 10/28/23 - f/u IDA. No recent iron infusions needed per report. Continue b12 injections. Due colonoscopy. Discussed agreeable for referral.    Other orders -     amLODIPine  Besylate; Take 1 tablet (5 mg total) by mouth daily.  Dispense: 90 tablet; Refill: 1     Allena Hamilton, MD

## 2023-11-24 NOTE — Assessment & Plan Note (Signed)
 Colonoscopy 04/10/17 - Non-thrombosed external hemorrhoids found on perianal exam. The examined portion of the ileum was normal. One 5 mm polyp in the transverse colon, removed with a cold snare. Resected and retrieved. Non-bleeding external hemorrhoids. Recommended f/u in 5 years (Dr Unk). Discussed due f/u colonoscopy. Agreeable for referral.

## 2023-11-25 ENCOUNTER — Other Ambulatory Visit: Payer: Self-pay

## 2023-11-25 ENCOUNTER — Ambulatory Visit: Payer: Self-pay | Admitting: Internal Medicine

## 2023-11-25 DIAGNOSIS — I10 Essential (primary) hypertension: Secondary | ICD-10-CM

## 2023-11-25 MED ORDER — VITAMIN D (ERGOCALCIFEROL) 1.25 MG (50000 UNIT) PO CAPS
50000.0000 [IU] | ORAL_CAPSULE | ORAL | 1 refills | Status: DC
  Filled 2023-11-25: qty 12, 84d supply, fill #0

## 2023-11-26 ENCOUNTER — Telehealth: Payer: Self-pay | Admitting: Internal Medicine

## 2023-11-26 ENCOUNTER — Other Ambulatory Visit
Admission: RE | Admit: 2023-11-26 | Discharge: 2023-11-26 | Disposition: A | Discharge status: Home / Self Care | Source: Ambulatory Visit | Attending: Internal Medicine | Admitting: Internal Medicine

## 2023-11-26 ENCOUNTER — Other Ambulatory Visit: Payer: Self-pay

## 2023-11-26 DIAGNOSIS — R195 Other fecal abnormalities: Secondary | ICD-10-CM | POA: Insufficient documentation

## 2023-11-26 DIAGNOSIS — R197 Diarrhea, unspecified: Secondary | ICD-10-CM | POA: Insufficient documentation

## 2023-11-26 LAB — C DIFFICILE QUICK SCREEN W PCR REFLEX
C Diff antigen: POSITIVE — AB
C Diff toxin: NEGATIVE

## 2023-11-26 LAB — CLOSTRIDIUM DIFFICILE BY PCR, REFLEXED
Hypervirulent Strain: NEGATIVE
Toxigenic C. Difficile by PCR: POSITIVE — AB

## 2023-11-26 MED ORDER — VANCOMYCIN HCL 125 MG PO CAPS
125.0000 mg | ORAL_CAPSULE | Freq: Four times a day (QID) | ORAL | 0 refills | Status: DC
  Filled 2023-11-26: qty 40, 10d supply, fill #0

## 2023-11-26 NOTE — Telephone Encounter (Signed)
 Pt notified of positive c.diff. fidaxomicin treatment recommended. Cost was 1400$.  Rx sent in for oral vancomycin 125mg  qid x 10 days to [facility] pharmacy.

## 2023-11-27 ENCOUNTER — Telehealth: Payer: Self-pay | Admitting: Internal Medicine

## 2023-11-27 ENCOUNTER — Encounter: Payer: Self-pay | Admitting: Oncology

## 2023-11-27 ENCOUNTER — Other Ambulatory Visit: Payer: Self-pay

## 2023-11-27 MED ORDER — FIDAXOMICIN 200 MG PO TABS
200.0000 mg | ORAL_TABLET | Freq: Two times a day (BID) | ORAL | 0 refills | Status: DC
  Filled 2023-11-27 – 2023-12-02 (×3): qty 20, 10d supply, fill #0

## 2023-11-27 NOTE — Telephone Encounter (Signed)
 I have sent in the prescription for dificid. Let me know if I need to do anything more.

## 2023-11-28 ENCOUNTER — Telehealth: Payer: Self-pay | Admitting: *Deleted

## 2023-11-28 ENCOUNTER — Inpatient Hospital Stay: Attending: Oncology

## 2023-11-28 ENCOUNTER — Encounter: Payer: Self-pay | Admitting: Internal Medicine

## 2023-11-28 VITALS — BP 129/78 | Temp 98.2°F | Resp 18

## 2023-11-28 DIAGNOSIS — E611 Iron deficiency: Secondary | ICD-10-CM

## 2023-11-28 DIAGNOSIS — E538 Deficiency of other specified B group vitamins: Secondary | ICD-10-CM | POA: Insufficient documentation

## 2023-11-28 MED ORDER — CYANOCOBALAMIN 1000 MCG/ML IJ SOLN
1000.0000 ug | Freq: Once | INTRAMUSCULAR | Status: AC
  Administered 2023-11-28: 1000 ug via INTRAMUSCULAR
  Filled 2023-11-28: qty 1

## 2023-11-28 NOTE — Patient Instructions (Signed)
 Vitamin B12 Injection What is this medication? Vitamin B12 (VAHY tuh min B12) prevents and treats low vitamin B12 levels in your body. It is used in people who do not get enough vitamin B12 from their diet or when their digestive tract does not absorb enough. Vitamin B12 plays an important role in maintaining the health of your nervous system and red blood cells. This medicine may be used for other purposes; ask your health care provider or pharmacist if you have questions. COMMON BRAND NAME(S): B-12 Compliance Kit, B-12 Injection Kit, Cyomin, Dodex , LA-12, Nutri-Twelve, Physicians EZ Use B-12, Primabalt, Vitamin Deficiency Injectable System - B12 What should I tell my care team before I take this medication? They need to know if you have any of these conditions: Kidney disease Leber's disease Megaloblastic anemia An unusual or allergic reaction to cyanocobalamin , cobalt, other medications, foods, dyes, or preservatives Pregnant or trying to get pregnant Breast-feeding How should I use this medication? This medication is injected into a muscle or deeply under the skin. It is usually given in a clinic or care team's office. However, your care team may teach you how to inject yourself. Follow all instructions. Talk to your care team about the use of this medication in children. Special care may be needed. Overdosage: If you think you have taken too much of this medicine contact a poison control center or emergency room at once. NOTE: This medicine is only for you. Do not share this medicine with others. What if I miss a dose? If you are given your dose at a clinic or care team's office, call to reschedule your appointment. If you give your own injections, and you miss a dose, take it as soon as you can. If it is almost time for your next dose, take only that dose. Do not take double or extra doses. What may interact with this medication? Alcohol Colchicine This list may not describe all possible  interactions. Give your health care provider a list of all the medicines, herbs, non-prescription drugs, or dietary supplements you use. Also tell them if you smoke, drink alcohol, or use illegal drugs. Some items may interact with your medicine. What should I watch for while using this medication? Visit your care team regularly. You may need blood work done while you are taking this medication. You may need to follow a special diet. Talk to your care team. Limit your alcohol intake and avoid smoking to get the best benefit. What side effects may I notice from receiving this medication? Side effects that you should report to your care team as soon as possible: Allergic reactions--skin rash, itching, hives, swelling of the face, lips, tongue, or throat Swelling of the ankles, hands, or feet Trouble breathing Side effects that usually do not require medical attention (report to your care team if they continue or are bothersome): Diarrhea This list may not describe all possible side effects. Call your doctor for medical advice about side effects. You may report side effects to FDA at 1-800-FDA-1088. Where should I keep my medication? Keep out of the reach of children. Store at room temperature between 15 and 30 degrees C (59 and 85 degrees F). Protect from light. Throw away any unused medication after the expiration date. NOTE: This sheet is a summary. It may not cover all possible information. If you have questions about this medicine, talk to your doctor, pharmacist, or health care provider.  2024 Elsevier/Gold Standard (2020-09-19 00:00:00)

## 2023-11-28 NOTE — Assessment & Plan Note (Signed)
 Loose stool - gel like stool as outlined. Check stool fo rc.diff. probiotic daily.

## 2023-11-28 NOTE — Assessment & Plan Note (Signed)
 Check vitamin D level today

## 2023-11-28 NOTE — Assessment & Plan Note (Signed)
 Continue amlodipine . Blood pressure as outlined. Recheck 134/82. No changes in medication. Follow pressures. Follow metabolic panel.

## 2023-11-28 NOTE — Assessment & Plan Note (Signed)
 Had f/u with Dr Melanee 10/28/23 - f/u IDA. No recent iron infusions needed per report. Continue b12 injections. Due colonoscopy. Discussed agreeable for referral.

## 2023-11-28 NOTE — Assessment & Plan Note (Signed)
Continue b12 injections.  

## 2023-11-28 NOTE — Assessment & Plan Note (Signed)
 Gel like stool as outlined. Recent abx use. Check stool for c.diff. probiotics daily. Follow.

## 2023-11-28 NOTE — Telephone Encounter (Signed)
Form received & placed in folder

## 2023-11-29 NOTE — Telephone Encounter (Signed)
 Form signed and placed in folder. Please fax.

## 2023-12-01 ENCOUNTER — Other Ambulatory Visit: Payer: Self-pay

## 2023-12-01 NOTE — Telephone Encounter (Signed)
Form faxed & sent to scan.

## 2023-12-02 ENCOUNTER — Encounter: Payer: Self-pay | Admitting: Oncology

## 2023-12-02 ENCOUNTER — Other Ambulatory Visit: Payer: Self-pay

## 2023-12-05 ENCOUNTER — Other Ambulatory Visit: Payer: Self-pay

## 2023-12-29 ENCOUNTER — Inpatient Hospital Stay: Attending: Oncology

## 2024-01-20 ENCOUNTER — Other Ambulatory Visit: Payer: Self-pay

## 2024-01-20 MED ORDER — NA SULFATE-K SULFATE-MG SULF 17.5-3.13-1.6 GM/177ML PO SOLN
ORAL | 0 refills | Status: AC
  Filled 2024-01-20: qty 354, 2d supply, fill #0

## 2024-01-23 ENCOUNTER — Other Ambulatory Visit: Payer: Self-pay

## 2024-01-26 ENCOUNTER — Other Ambulatory Visit: Payer: Self-pay

## 2024-01-26 ENCOUNTER — Encounter: Payer: Self-pay | Admitting: Internal Medicine

## 2024-01-26 ENCOUNTER — Ambulatory Visit: Payer: Self-pay | Admitting: Internal Medicine

## 2024-01-26 ENCOUNTER — Telehealth: Payer: Self-pay | Admitting: Internal Medicine

## 2024-01-26 ENCOUNTER — Encounter: Payer: Self-pay | Admitting: Oncology

## 2024-01-26 ENCOUNTER — Ambulatory Visit: Admitting: Internal Medicine

## 2024-01-26 VITALS — BP 138/82 | HR 72 | Temp 97.6°F | Ht 64.0 in | Wt 216.0 lb

## 2024-01-26 DIAGNOSIS — D649 Anemia, unspecified: Secondary | ICD-10-CM

## 2024-01-26 DIAGNOSIS — E559 Vitamin D deficiency, unspecified: Secondary | ICD-10-CM

## 2024-01-26 DIAGNOSIS — M810 Age-related osteoporosis without current pathological fracture: Secondary | ICD-10-CM | POA: Diagnosis not present

## 2024-01-26 DIAGNOSIS — E538 Deficiency of other specified B group vitamins: Secondary | ICD-10-CM

## 2024-01-26 DIAGNOSIS — I10 Essential (primary) hypertension: Secondary | ICD-10-CM | POA: Diagnosis not present

## 2024-01-26 DIAGNOSIS — R748 Abnormal levels of other serum enzymes: Secondary | ICD-10-CM | POA: Diagnosis not present

## 2024-01-26 DIAGNOSIS — E611 Iron deficiency: Secondary | ICD-10-CM | POA: Diagnosis not present

## 2024-01-26 LAB — HEPATIC FUNCTION PANEL
ALT: 12 U/L (ref 3–35)
AST: 17 U/L (ref 5–37)
Albumin: 4.1 g/dL (ref 3.5–5.2)
Alkaline Phosphatase: 132 U/L — ABNORMAL HIGH (ref 39–117)
Bilirubin, Direct: 0.2 mg/dL (ref 0.1–0.3)
Total Bilirubin: 0.8 mg/dL (ref 0.2–1.2)
Total Protein: 6.8 g/dL (ref 6.0–8.3)

## 2024-01-26 LAB — BASIC METABOLIC PANEL WITH GFR
BUN: 13 mg/dL (ref 6–23)
CO2: 27 meq/L (ref 19–32)
Calcium: 8 mg/dL — ABNORMAL LOW (ref 8.4–10.5)
Chloride: 107 meq/L (ref 96–112)
Creatinine, Ser: 0.56 mg/dL (ref 0.40–1.20)
GFR: 91.66 mL/min
Glucose, Bld: 89 mg/dL (ref 70–99)
Potassium: 3.8 meq/L (ref 3.5–5.1)
Sodium: 141 meq/L (ref 135–145)

## 2024-01-26 LAB — LIPID PANEL
Cholesterol: 103 mg/dL (ref 28–200)
HDL: 53.4 mg/dL
LDL Cholesterol: 38 mg/dL (ref 10–99)
NonHDL: 49.77
Total CHOL/HDL Ratio: 2
Triglycerides: 60 mg/dL (ref 10.0–149.0)
VLDL: 12 mg/dL (ref 0.0–40.0)

## 2024-01-26 LAB — GAMMA GT: GGT: 14 U/L (ref 7–51)

## 2024-01-26 MED ORDER — AMLODIPINE BESYLATE 5 MG PO TABS
5.0000 mg | ORAL_TABLET | Freq: Every day | ORAL | 1 refills | Status: AC
  Filled 2024-01-26: qty 90, 90d supply, fill #0

## 2024-01-26 MED ORDER — ALENDRONATE SODIUM 70 MG PO TABS
70.0000 mg | ORAL_TABLET | ORAL | 3 refills | Status: AC
  Filled 2024-01-26: qty 12, 84d supply, fill #0

## 2024-01-26 MED ORDER — VITAMIN D (ERGOCALCIFEROL) 1.25 MG (50000 UNIT) PO CAPS
50000.0000 [IU] | ORAL_CAPSULE | ORAL | 1 refills | Status: AC
  Filled 2024-01-26: qty 12, 84d supply, fill #0

## 2024-01-26 NOTE — Progress Notes (Signed)
 "  Subjective:    [name], female    DOB: 12/07/1950, 74 y.o.   MRN: [id number]  Patient here for  Chief Complaint  Patient presents with   Medical Management of Chronic Issues    8 week f/up    HPI Here for a scheduled follow up -  follow up regarding hypertension. Continues on amlodipine  for blood pressure. Saw Dr Gollan 08/2021 for evaluation - sob with exertion. ECHO 11/2021 - EF 60-65% with G1DD. Mild to moderate regurgitation of tricuspid valve. Mildly elevated right heart pressures. Recommended lasix  20mg  three times per week with potassium. Also noted borderline dilatation of the ascending aorta. Saw Dr Assakar 08/26/22 - recommended PFTs, CT chest. Continue albuterol  prn. Had f/u 11/2022 - high res CT normal. PFTs - slightly reduced FVC and increased ratio in the setting of obesity. Last visit - diagnosed with c.diff. treated.  Had f/u with Dr Melanee 10/28/23 - f/u IDA. Continue b12 injections. Due colonoscopy. Saw Dr Therisa 01/20/24 - recommended colonoscopy. Was scheduled for 01/27/2024.  Reports she is doing relatively well. No chest pain. Breathing stable. No abdominal pain. Not taking vitamin D . No taking fosamax . Discussed importance of taking her medications as directed.    Past Medical History:  Diagnosis Date   Anemia    not currently being treated for this   Arthritis    History of colon polyps    Hypertension    Past Surgical History:  Procedure Laterality Date   CHOLECYSTECTOMY  1989   COLONOSCOPY WITH PROPOFOL  N/A 04/10/2017   Procedure: COLONOSCOPY WITH PROPOFOL ;  Surgeon: Unk Corinn Skiff, MD;  Location: [facility] ENDOSCOPY;  Service: Gastroenterology;  Laterality: N/A;   ESOPHAGOGASTRODUODENOSCOPY (EGD) WITH PROPOFOL  N/A 04/10/2017   Procedure: ESOPHAGOGASTRODUODENOSCOPY (EGD) WITH PROPOFOL ;  Surgeon: Unk Corinn Skiff, MD;  Location: [facility] END[facility]COPY;  Service: Gastroenterology;  Laterality: N/A;   GASTRIC BYPASS  03/21/03   JOINT REPLACEMENT Right 04/2014    TOTAL KNEE REPLACEMENT, DR. KATHLYNN, [facility]   TOTAL KNEE ARTHROPLASTY Left 02/27/2016   Procedure: TOTAL KNEE ARTHROPLASTY;  Surgeon: Ozell Kathlynn, MD;  Location: [facility] ORS;  Service: Orthopedics;  Laterality: Left;   TUBAL LIGATION  1976   Family History  Problem Relation Age of Onset   Hypertension Mother    Diabetes Mother    Heart disease Father    Cancer Maternal Aunt        question of type   Cancer Maternal Grandmother        colon   Breast cancer Cousin 36       maternal side   Social History   Socioeconomic History   Marital status: Married    Spouse name: Not on file   Number of children: 2   Years of education: Not on file   Highest education level: Not on file  Occupational History    Employer: [employer]  Tobacco Use   Smoking status: Never   Smokeless tobacco: Never  Vaping Use   Vaping status: Never Used  Substance and Sexual Activity   Alcohol use: No    Alcohol/week: 0.0 standard drinks of alcohol   Drug use: No   Sexual activity: Yes  Other Topics Concern   Not on file  Social History Narrative   married   Social Drivers of Health   Tobacco Use: Low Risk (02/01/2024)   Patient History    Smoking Tobacco Use: Never    Smokeless Tobacco Use: Never    Passive Exposure: Not on  file  Financial Resource Strain: Low Risk  (01/20/2024)   Received from [facility]   Overall Financial Resource Strain (CARDIA)    Difficulty of Paying Living Expenses: Not hard at all  Food Insecurity: No Food Insecurity (01/20/2024)   Received from [facility]   Epic    Within the past 12 months, you worried that your food would run out before you got the money to buy more.: Never true    Within the past 12 months, the food you bought just didn't last and you didn't have money to get more.: Never true  Transportation Needs: No Transportation Needs (01/20/2024)   Received from [facility] - Transportation    In the past  12 months, has lack of transportation kept you from medical appointments or from getting medications?: No    Lack of Transportation (Non-Medical): No  Physical Activity: Inactive (11/17/2023)   Exercise Vital Sign    Days of Exercise per Week: 0 days    Minutes of Exercise per Session: 0 min  Stress: No Stress Concern Present (11/17/2023)   [employer] of Occupational [employer] - Occupational Stress Questionnaire    Feeling of Stress: Not at all  Social Connections: Socially Integrated (11/17/2023)   Social Connection and Isolation Panel    Frequency of Communication with Friends and Family: More than three times a week    Frequency of Social Gatherings with Friends and Family: More than three times a week    Attends Religious Services: More than 4 times per year    Active Member of Clubs or Organizations: Yes    Attends Banker Meetings: More than 4 times per year    Marital Status: Married  Depression (PHQ2-9): Low Risk (01/26/2024)   Depression (PHQ2-9)    PHQ-2 Score: 0  Alcohol Screen: Low Risk (11/17/2023)   Alcohol Screen    Last Alcohol Screening Score (AUDIT): 0  Housing: Low Risk  (01/20/2024)   Received from [facility]   Epic    In the last 12 months, was there a time when you were not able to pay the mortgage or rent on time?: No    In the past 12 months, how many times have you moved where you were living?: 0    At any time in the past 12 months, were you homeless or living in a shelter (including now)?: No  Utilities: Not At Risk (01/20/2024)   Received from [facility]   Epic    In the past 12 months has the electric, gas, oil, or water company threatened to shut off services in your home?: No  Health Literacy: Adequate Health Literacy (11/17/2023)   B1300 Health Literacy    Frequency of need for help with medical instructions: Never     Review of Systems  Constitutional:  Negative for appetite change and  unexpected weight change.  HENT:  Negative for congestion and sinus pressure.   Respiratory:  Negative for cough, chest tightness and shortness of breath.   Cardiovascular:  Negative for chest pain and palpitations.       No increased swelling.   Gastrointestinal:  Negative for diarrhea, nausea and vomiting.  Genitourinary:  Negative for difficulty urinating and dysuria.  Musculoskeletal:  Negative for myalgias.  Skin:  Negative for color change and rash.  Neurological:  Negative for dizziness and headaches.  Psychiatric/Behavioral:  Negative for agitation and dysphoric mood.  Objective:     BP 138/82   Pulse 72   Temp 97.6 F (36.4 C) (Oral)   Ht 5' 4 (1.626 m)   Wt 216 lb (98 kg)   LMP 04/06/2009   SpO2 99%   BMI 37.08 kg/m  Wt Readings from Last 3 Encounters:  01/26/24 216 lb (98 kg)  11/24/23 215 lb 2 oz (97.6 kg)  11/17/23 213 lb (96.6 kg)    Physical Exam Vitals reviewed.  Constitutional:      General: She is not in acute distress.    Appearance: Normal appearance.  HENT:     Head: Normocephalic and atraumatic.     Right Ear: External ear normal.     Left Ear: External ear normal.     Mouth/Throat:     Pharynx: No oropharyngeal exudate or posterior oropharyngeal erythema.  Eyes:     General: No scleral icterus.       Right eye: No discharge.        Left eye: No discharge.     Conjunctiva/sclera: Conjunctivae normal.  Neck:     Thyroid : No thyromegaly.  Cardiovascular:     Rate and Rhythm: Normal rate and regular rhythm.  Pulmonary:     Effort: No respiratory distress.     Breath sounds: Normal breath sounds. No wheezing.  Abdominal:     General: Bowel sounds are normal.     Palpations: Abdomen is soft.     Tenderness: There is no abdominal tenderness.  Musculoskeletal:        General: No tenderness.     Cervical back: Neck supple. No tenderness.     Comments: No increased swelling.   Lymphadenopathy:     Cervical: No cervical adenopathy.   Skin:    Findings: No erythema or rash.  Neurological:     Mental Status: She is alert.  Psychiatric:        Mood and Affect: Mood normal.        Behavior: Behavior normal.         Outpatient Encounter Medications as of 01/26/2024  Medication Sig   albuterol  (VENTOLIN  HFA) 108 (90 Base) MCG/ACT inhaler Inhale 2 puffs into the lungs every 6 (six) hours as needed for wheezing or shortness of breath.   Na Sulfate-K Sulfate-Mg Sulfate concentrate (SUPREP) 17.5-3.13-1.6 GM/177ML SOLN Take 1 Bottle by mouth as directed One kit contains 2 bottles.  Take both bottles at the times instructed by your provider.   polyethylene glycol powder (GLYCOLAX /MIRALAX ) 17 GM/SCOOP powder Take 17 g by mouth daily. PRN   alendronate  (FOSAMAX ) 70 MG tablet Take 1 tablet (70 mg total) by mouth every 7 (seven) days. Take with a full glass of water on an empty stomach.   amLODipine  (NORVASC ) 5 MG tablet Take 1 tablet (5 mg total) by mouth daily.   Vitamin D , Ergocalciferol , (DRISDOL ) 1.25 MG (50000 UNIT) CAPS capsule Take 1 capsule (50,000 Units total) by mouth every 7 (seven) days.   [DISCONTINUED] alendronate  (FOSAMAX ) 70 MG tablet Take 1 tablet (70 mg total) by mouth every 7 (seven) days. Take with a full glass of water on an empty stomach. (Patient not taking: Reported on 01/26/2024)   [DISCONTINUED] amLODipine  (NORVASC ) 5 MG tablet Take 1 tablet (5 mg total) by mouth daily.   [DISCONTINUED] fidaxomicin  (DIFICID ) 200 MG TABS tablet Take 1 tablet (200 mg total) by mouth 2 (two) times daily. (Patient not taking: Reported on 01/26/2024)   [DISCONTINUED] Vitamin D , Ergocalciferol , (DRISDOL ) 1.25 MG (50000 UNIT)  CAPS capsule Take 1 capsule (50,000 Units total) by mouth every 7 (seven) days.   No facility-administered encounter medications on file as of 01/26/2024.     Lab Results  Component Value Date   WBC 5.3 10/28/2023   HGB 13.0 10/28/2023   HCT 38.7 10/28/2023   PLT 176 10/28/2023   GLUCOSE 89 01/26/2024    CHOL 103 01/26/2024   TRIG 60.0 01/26/2024   HDL 53.40 01/26/2024   LDLCALC 38 01/26/2024   ALT 12 01/26/2024   AST 17 01/26/2024   NA 141 01/26/2024   K 3.8 01/26/2024   CL 107 01/26/2024   CREATININE 0.56 01/26/2024   BUN 13 01/26/2024   CO2 27 01/26/2024   TSH 0.91 07/22/2023   INR 1.14 02/21/2016   HGBA1C 5.6 03/25/2019    No results found.     Assessment & Plan:  Elevated alkaline phosphatase level Assessment & Plan: Discussed.  Will checl recheck liver panel and GGT. She has a very low vitamin D . Not taking her prescription vitamin D . Discussed today the need to take prescription medication as directed. Plans to start.   Orders: -     Gamma GT  Essential hypertension, benign Assessment & Plan: Continue amlodipine . Need to make sure taking as directed. Hold on making any changes in medication. Follow pressures. Follow metabolic panel.   Orders: -     Lipid panel -     Hepatic function panel -     Basic metabolic panel with GFR  Iron deficiency Assessment & Plan: Had f/u with Dr Melanee 10/28/23 - f/u IDA. Has received intermittent iron infusions. Continue b12 injections. Saw Dr Therisa. Planning for colonoscopy.    B12 deficiency Assessment & Plan: Continue b12 injections.    Vitamin D  deficiency Assessment & Plan: Very low vitamin D  level. Reports she is not taking her vitamin D  supplement (prescription vitamin D ). Discussed the need to take as directed. Follow.    Anemia, unspecified type Assessment & Plan: Has seen GI.  Being followed by hematology.  intermittent iron infusions. Continue f/u with hematology. Saw GI 12/2023. Planning for colonoscopy.    Osteoporosis without current pathological fracture, unspecified osteoporosis type Assessment & Plan: Not taking fosamax  or vitamin D . Discussed the need to take as directed. Will start. Follow.    Other orders -     amLODIPine  Besylate; Take 1 tablet (5 mg total) by mouth daily.  Dispense: 90 tablet;  Refill: 1 -     Vitamin D  (Ergocalciferol ); Take 1 capsule (50,000 Units total) by mouth every 7 (seven) days.  Dispense: 12 capsule; Refill: 1 -     Alendronate  Sodium; Take 1 tablet (70 mg total) by mouth every 7 (seven) days. Take with a full glass of water on an empty stomach.  Dispense: 12 tablet; Refill: 3     Allena Hamilton, MD "

## 2024-01-26 NOTE — Telephone Encounter (Signed)
 Copied from CRM [id number]. Topic: Referral - Question >> Jan 26, 2024  3:38 PM Antony RAMAN wrote: Reason for CRM: patient was told she had to pay $400 for her colonoscopy tomorrow, she wants to know if she can get it done at [facility] or some place it wont be that expensive [phone]

## 2024-01-27 ENCOUNTER — Encounter: Admission: RE | Payer: Self-pay | Source: Home / Self Care

## 2024-01-27 ENCOUNTER — Ambulatory Visit: Admission: RE | Admit: 2024-01-27 | Source: Home / Self Care | Admitting: Gastroenterology

## 2024-01-27 ENCOUNTER — Ambulatory Visit

## 2024-01-27 DIAGNOSIS — Z538 Procedure and treatment not carried out for other reasons: Secondary | ICD-10-CM | POA: Diagnosis not present

## 2024-01-27 DIAGNOSIS — Z09 Encounter for follow-up examination after completed treatment for conditions other than malignant neoplasm: Secondary | ICD-10-CM | POA: Diagnosis present

## 2024-01-27 DIAGNOSIS — Z860100 Personal history of colon polyps, unspecified: Secondary | ICD-10-CM | POA: Diagnosis not present

## 2024-01-27 SURGERY — COLONOSCOPY
Anesthesia: General

## 2024-01-28 ENCOUNTER — Other Ambulatory Visit: Payer: Self-pay

## 2024-01-28 MED ORDER — NA SULFATE-K SULFATE-MG SULF 17.5-3.13-1.6 GM/177ML PO SOLN
ORAL | 0 refills | Status: AC
  Filled 2024-01-28: qty 354, 1d supply, fill #0

## 2024-01-29 ENCOUNTER — Other Ambulatory Visit: Payer: Self-pay

## 2024-01-29 ENCOUNTER — Inpatient Hospital Stay: Attending: Oncology

## 2024-01-29 DIAGNOSIS — E538 Deficiency of other specified B group vitamins: Secondary | ICD-10-CM | POA: Insufficient documentation

## 2024-01-29 DIAGNOSIS — E611 Iron deficiency: Secondary | ICD-10-CM

## 2024-01-29 MED ORDER — CYANOCOBALAMIN 1000 MCG/ML IJ SOLN
1000.0000 ug | Freq: Once | INTRAMUSCULAR | Status: AC
  Administered 2024-01-29: 1000 ug via INTRAMUSCULAR
  Filled 2024-01-29: qty 1

## 2024-02-01 ENCOUNTER — Encounter: Payer: Self-pay | Admitting: Internal Medicine

## 2024-02-01 NOTE — Assessment & Plan Note (Signed)
 Not taking fosamax  or vitamin D . Discussed the need to take as directed. Will start. Follow.

## 2024-02-01 NOTE — Assessment & Plan Note (Addendum)
 Has seen GI.  Being followed by hematology.  intermittent iron infusions. Continue f/u with hematology. Saw GI 12/2023. Planning for colonoscopy.

## 2024-02-01 NOTE — Assessment & Plan Note (Signed)
 Very low vitamin D  level. Reports she is not taking her vitamin D  supplement (prescription vitamin D ). Discussed the need to take as directed. Follow.

## 2024-02-01 NOTE — Assessment & Plan Note (Signed)
Continue b12 injections.  

## 2024-02-01 NOTE — Assessment & Plan Note (Signed)
 Discussed.  Will checl recheck liver panel and GGT. She has a very low vitamin D . Not taking her prescription vitamin D . Discussed today the need to take prescription medication as directed. Plans to start.

## 2024-02-01 NOTE — Assessment & Plan Note (Signed)
 Continue amlodipine . Need to make sure taking as directed. Hold on making any changes in medication. Follow pressures. Follow metabolic panel.

## 2024-02-01 NOTE — Assessment & Plan Note (Signed)
 Had f/u with Dr Melanee 10/28/23 - f/u IDA. Has received intermittent iron infusions. Continue b12 injections. Saw Dr Therisa. Planning for colonoscopy.

## 2024-02-11 ENCOUNTER — Ambulatory Visit: Admitting: Certified Registered"

## 2024-02-11 ENCOUNTER — Encounter: Payer: Self-pay | Admitting: Gastroenterology

## 2024-02-11 ENCOUNTER — Encounter
Admission: RE | Disposition: A | Discharge status: Home / Self Care | Payer: Self-pay | Source: Home / Self Care | Attending: Gastroenterology

## 2024-02-11 ENCOUNTER — Ambulatory Visit
Admission: RE | Admit: 2024-02-11 | Discharge: 2024-02-11 | Disposition: A | Discharge status: Home / Self Care | Attending: Gastroenterology | Admitting: Gastroenterology

## 2024-02-11 DIAGNOSIS — D122 Benign neoplasm of ascending colon: Secondary | ICD-10-CM | POA: Diagnosis not present

## 2024-02-11 DIAGNOSIS — M1990 Unspecified osteoarthritis, unspecified site: Secondary | ICD-10-CM | POA: Diagnosis not present

## 2024-02-11 DIAGNOSIS — I10 Essential (primary) hypertension: Secondary | ICD-10-CM | POA: Diagnosis not present

## 2024-02-11 DIAGNOSIS — Z6837 Body mass index (BMI) 37.0-37.9, adult: Secondary | ICD-10-CM | POA: Insufficient documentation

## 2024-02-11 DIAGNOSIS — Z1211 Encounter for screening for malignant neoplasm of colon: Secondary | ICD-10-CM | POA: Insufficient documentation

## 2024-02-11 DIAGNOSIS — E66813 Obesity, class 3: Secondary | ICD-10-CM | POA: Diagnosis not present

## 2024-02-11 HISTORY — PX: COLONOSCOPY: SHX5424

## 2024-02-11 HISTORY — PX: POLYPECTOMY: SHX149

## 2024-02-11 MED ORDER — SODIUM CHLORIDE 0.9 % IV SOLN: INTRAVENOUS | Status: DC

## 2024-02-11 MED ORDER — LIDOCAINE HCL (CARDIAC) PF 100 MG/5ML IV SOSY
PREFILLED_SYRINGE | INTRAVENOUS | Status: DC | PRN
  Administered 2024-02-11: 100 mg via INTRAVENOUS

## 2024-02-11 MED ORDER — SODIUM CHLORIDE 0.9 % IV SOLN: INTRAVENOUS | Status: DC | PRN

## 2024-02-11 MED ORDER — PROPOFOL 10 MG/ML IV BOLUS
INTRAVENOUS | Status: DC | PRN
  Administered 2024-02-11: 60 mg via INTRAVENOUS
  Administered 2024-02-11: 20 mg via INTRAVENOUS

## 2024-02-11 MED ORDER — PROPOFOL 500 MG/50ML IV EMUL
INTRAVENOUS | Status: DC | PRN
  Administered 2024-02-11: 145 ug/kg/min via INTRAVENOUS

## 2024-02-11 MED ORDER — GLYCOPYRROLATE 0.2 MG/ML IJ SOLN
INTRAMUSCULAR | Status: DC | PRN
  Administered 2024-02-11: .2 mg via INTRAVENOUS

## 2024-02-11 MED ORDER — EPHEDRINE SULFATE-NACL 50-0.9 MG/10ML-% IV SOSY
PREFILLED_SYRINGE | INTRAVENOUS | Status: DC | PRN
  Administered 2024-02-11: 10 mg via INTRAVENOUS

## 2024-02-11 NOTE — H&P (Signed)
 "  Ruel Kung , MD [address], [address], [place], [place], [address] Phone: [phone] Fax: [phone]  Primary Care Physician:  Glendia Shad, MD   Pre-Procedure History & Physical: HPI:  Kelly Wallace is a 74 y.o. female is here for an colonoscopy.   Past Medical History:  Diagnosis Date   Anemia    not currently being treated for this   Arthritis    History of colon polyps    Hypertension     Past Surgical History:  Procedure Laterality Date   CHOLECYSTECTOMY  1989   COLONOSCOPY WITH PROPOFOL  N/A 04/10/2017   Procedure: COLONOSCOPY WITH PROPOFOL ;  Surgeon: Unk Corinn Skiff, MD;  Location: [facility] ENDOSCOPY;  Service: Gastroenterology;  Laterality: N/A;   ESOPHAGOGASTRODUODENOSCOPY (EGD) WITH PROPOFOL  N/A 04/10/2017   Procedure: ESOPHAGOGASTRODUODENOSCOPY (EGD) WITH PROPOFOL ;  Surgeon: Unk Corinn Skiff, MD;  Location: [facility] ENDOSCOPY;  Service: Gastroenterology;  Laterality: N/A;   GASTRIC BYPASS  03/21/03   JOINT REPLACEMENT Right 04/2014   TOTAL KNEE REPLACEMENT, DR. KATHLYNN, [facility]   TOTAL KNEE ARTHROPLASTY Left 02/27/2016   Procedure: TOTAL KNEE ARTHROPLASTY;  Surgeon: Ozell Kathlynn, MD;  Location: [facility] ORS;  Service: Orthopedics;  Laterality: Left;   TUBAL LIGATION  1976    Prior to Admission medications  Medication Sig Start Date End Date Taking? Authorizing Provider  albuterol  (VENTOLIN  HFA) 108 (90 Base) MCG/ACT inhaler Inhale 2 puffs into the lungs every 6 (six) hours as needed for wheezing or shortness of breath. 07/22/23   Glendia Shad, MD  alendronate  (FOSAMAX ) 70 MG tablet Take 1 tablet (70 mg total) by mouth every 7 (seven) days. Take with a full glass of water on an empty stomach. 01/26/24   Glendia Shad, MD  amLODipine  (NORVASC ) 5 MG tablet Take 1 tablet (5 mg total) by mouth daily. 01/26/24   Glendia Shad, MD  Na Sulfate-K Sulfate-Mg Sulfate concentrate (SUPREP) 17.5-3.13-1.6 GM/177ML SOLN Take 1 Bottle by mouth as directed One kit contains 2  bottles.  Take both bottles at the times instructed by your provider. 01/20/24     Na Sulfate-K Sulfate-Mg Sulfate concentrate (SUPREP) 17.5-3.13-1.6 GM/177ML SOLN Take 1 Bottle by mouth as directed One kit contains 2 bottles.  Take both bottles at the times instructed by your provider. 01/28/24     polyethylene glycol powder (GLYCOLAX /MIRALAX ) 17 GM/SCOOP powder Take 17 g by mouth daily. PRN 08/27/21   Jens Durand, MD  Vitamin D , Ergocalciferol , (DRISDOL ) 1.25 MG (50000 UNIT) CAPS capsule Take 1 capsule (50,000 Units total) by mouth every 7 (seven) days. 01/26/24   Glendia Shad, MD    Allergies as of 01/28/2024   (No Known Allergies)    Family History  Problem Relation Age of Onset   Hypertension Mother    Diabetes Mother    Heart disease Father    Cancer Maternal Aunt        question of type   Cancer Maternal Grandmother        colon   Breast cancer Cousin 60       maternal side    Social History   Socioeconomic History   Marital status: Married    Spouse name: Not on file   Number of children: 2   Years of education: Not on file   Highest education level: Not on file  Occupational History    Employer: [employer]  Tobacco Use   Smoking status: Never   Smokeless tobacco: Never  Vaping Use   Vaping status: Never Used  Substance and Sexual Activity   Alcohol use: No    Alcohol/week: 0.0 standard drinks of alcohol   Drug use: No   Sexual activity: Yes  Other Topics Concern   Not on file  Social History Narrative   married   Social Drivers of Health   Tobacco Use: Low Risk (02/01/2024)   Patient History    Smoking Tobacco Use: Never    Smokeless Tobacco Use: Never    Passive Exposure: Not on file  Financial Resource Strain: Low Risk  (01/20/2024)   Received from [facility]   Overall Financial Resource Strain (CARDIA)    Difficulty of Paying Living Expenses: Not hard at all  Food Insecurity: No Food Insecurity (01/20/2024)   Received from [facility]   Epic    Within the past 12 months, you worried that your food would run out before you got the money to buy more.: Never true    Within the past 12 months, the food you bought just didn't last and you didn't have money to get more.: Never true  Transportation Needs: No Transportation Needs (01/20/2024)   Received from [facility] - Transportation    In the past 12 months, has lack of transportation kept you from medical appointments or from getting medications?: No    Lack of Transportation (Non-Medical): No  Physical Activity: Inactive (11/17/2023)   Exercise Vital Sign    Days of Exercise per Week: 0 days    Minutes of Exercise per Session: 0 min  Stress: No Stress Concern Present (11/17/2023)   [employer] of Occupational [employer] - Occupational Stress Questionnaire    Feeling of Stress: Not at all  Social Connections: Socially Integrated (11/17/2023)   Social Connection and Isolation Panel    Frequency of Communication with Friends and Family: More than three times a week    Frequency of Social Gatherings with Friends and Family: More than three times a week    Attends Religious Services: More than 4 times per year    Active Member of [employer] or Organizations: Yes    Attends Banker Meetings: More than 4 times per year    Marital Status: Married  Catering Manager Violence: Not At Risk (11/17/2023)   Epic    Fear of Current or Ex-Partner: No    Emotionally Abused: No    Physically Abused: No    Sexually Abused: No  Depression (PHQ2-9): Low Risk (01/26/2024)   Depression (PHQ2-9)    PHQ-2 Score: 0  Alcohol Screen: Low Risk (11/17/2023)   Alcohol Screen    Last Alcohol Screening Score (AUDIT): 0  Housing: Low Risk  (01/20/2024)   Received from [facility]   Epic    In the last 12 months, was there a time when you were not able to pay the mortgage or rent on time?: No    In the past 12  months, how many times have you moved where you were living?: 0    At any time in the past 12 months, were you homeless or living in a shelter (including now)?: No  Utilities: Not At Risk (01/20/2024)   Received from [facility]   Epic    In the past 12 months has the electric, gas, oil, or water company threatened to shut off services in your home?: No  Health Literacy: Adequate Health Literacy (11/17/2023)   B1300 Health Literacy    Frequency of  need for help with medical instructions: Never    Review of Systems: See HPI, otherwise negative ROS  Physical Exam: LMP 04/06/2009  General:   Alert,  pleasant and cooperative in NAD Head:  Normocephalic and atraumatic. Neck:  Supple; no masses or thyromegaly. Lungs:  Clear throughout to auscultation, normal respiratory effort.    Heart:  +S1, +S2, Regular rate and rhythm, No edema. Abdomen:  Soft, nontender and nondistended. Normal bowel sounds, without guarding, and without rebound.   Neurologic:  Alert and  oriented x4;  grossly normal neurologically.  Impression/Plan: Kelly Wallace is here for an colonoscopy to be performed for surveillance due to prior history of colon polyps   Risks, benefits, limitations, and alternatives regarding  colonoscopy have been reviewed with the patient.  Questions have been answered.  All parties agreeable.   Ruel Kung, MD  02/11/2024, 7:37 AM  "

## 2024-02-11 NOTE — Op Note (Signed)
 [facility] Gastroenterology Patient Name: Kelly Wallace Procedure Date: 02/11/2024 7:59 AM MRN: [id number] Account #: [id number] Date of Birth: [birth year] Admit Type: Outpatient Age: 74 Room: [facility] ENDO ROOM 3 Gender: Female Note Status: Finalized Instrument Name: Colon Scope [id number] Procedure:             Colonoscopy Indications:           Surveillance: Personal history of adenomatous polyps                         on last colonoscopy 5 years ago, Last colonoscopy:                         March 2019 Providers:             Ruel Kung MD, MD Referring MD:          Allena Hamilton, MD (Referring MD) Medicines:             Monitored Anesthesia Care Complications:         No immediate complications. Procedure:             Pre-Anesthesia Assessment:                        - Prior to the procedure, a History and Physical was                         performed, and patient medications, allergies and                         sensitivities were reviewed. The patient's tolerance                         of previous anesthesia was reviewed.                        - The risks and benefits of the procedure and the                         sedation options and risks were discussed with the                         patient. All questions were answered and informed                         consent was obtained.                        - ASA Grade Assessment: II - A patient with mild                         systemic disease.                        After obtaining informed consent, the colonoscope was                         passed under direct vision. Throughout the procedure,                         the patient's blood  pressure, pulse, and oxygen                         saturations were monitored continuously. The                         Colonoscope was introduced through the anus and                         advanced to the the cecum, identified by the                         appendiceal  orifice. The colonoscopy was performed                         with ease. The patient tolerated the procedure well.                         The quality of the bowel preparation was excellent.                         The ileocecal valve, appendiceal orifice, and rectum                         were photographed. Findings:      The perianal and digital rectal examinations were normal.      A 3 mm polyp was found in the ascending colon. The polyp was sessile.       The polyp was removed with a cold biopsy forceps. Resection and       retrieval were complete.      The exam was otherwise without abnormality on direct and retroflexion       views. Impression:            - One 3 mm polyp in the ascending colon, removed with                         a cold biopsy forceps. Resected and retrieved.                        - The examination was otherwise normal on direct and                         retroflexion views. Recommendation:        - Discharge patient to home (with escort).                        - Resume previous diet.                        - Continue present medications.                        - Await pathology results.                        - Repeat colonoscopy for surveillance based on                         pathology results. Procedure Code(s):     ---  Professional ---                        [phone], Colonoscopy, flexible; with biopsy, single or                         multiple Diagnosis Code(s):     --- Professional ---                        Z86.010, Personal history of colonic polyps                        D12.2, Benign neoplasm of ascending colon CPT copyright 2022 American Medical Association. All rights reserved. The codes documented in this report are preliminary and upon coder review may  be revised to meet current compliance requirements. Ruel Kung, MD Ruel Kung MD, MD 02/11/2024 8:42:51 AM This report has been signed electronically. Number of Addenda: 0 Note Initiated  On: 02/11/2024 7:59 AM Scope Withdrawal Time: 0 hours 10 minutes 7 seconds  Total Procedure Duration: 0 hours 13 minutes 56 seconds  Estimated Blood Loss:  Estimated blood loss: none.      [facility]

## 2024-02-11 NOTE — Transfer of Care (Signed)
 Immediate Anesthesia Transfer of Care Note  Patient: Kelly Wallace  Procedure(s) Performed: COLONOSCOPY POLYPECTOMY, INTESTINE  Patient Location: Endoscopy Unit  Anesthesia Type:General  Level of Consciousness: drowsy and patient cooperative  Airway & Oxygen Therapy: Patient Spontanous Breathing and Patient connected to face mask oxygen  Post-op Assessment: Report given to RN and Post -op Vital signs reviewed and stable  Post vital signs: Reviewed and stable  Last Vitals:  Vitals Value Taken Time  BP    Temp    Pulse    Resp    SpO2      Last Pain:  Vitals:   02/11/24 0753  TempSrc: Temporal  PainSc: 0-No pain         Complications: No notable events documented.

## 2024-02-11 NOTE — Anesthesia Postprocedure Evaluation (Signed)
"   Anesthesia Post Note  Patient: Kelly Wallace  Procedure(s) Performed: COLONOSCOPY POLYPECTOMY, INTESTINE  Patient location during evaluation: PACU Anesthesia Type: General Level of consciousness: awake Pain management: satisfactory to patient Vital Signs Assessment: post-procedure vital signs reviewed and stable Respiratory status: nonlabored ventilation Cardiovascular status: stable Anesthetic complications: no   No notable events documented.   Last Vitals:  Vitals:   02/11/24 0900 02/11/24 0914  BP:  120/78  Pulse: 91 78  Resp: 16 18  Temp:    SpO2: (!) 87% 100%    Last Pain:  Vitals:   02/11/24 0753  TempSrc: Temporal  PainSc: 0-No pain                 VAN STAVEREN,Farhiya Rosten      "

## 2024-02-11 NOTE — Anesthesia Procedure Notes (Signed)
 Procedure Name: General with mask airway Date/Time: 02/11/2024 8:24 AM  Performed by: Ledora Duncan, CRNAPre-anesthesia Checklist: Patient identified, Emergency Drugs available, Suction available and Patient being monitored Patient Re-evaluated:Patient Re-evaluated prior to induction Oxygen Delivery Method: Simple face mask Induction Type: IV induction Placement Confirmation: positive ETCO2 and breath sounds checked- equal and bilateral Dental Injury: Teeth and Oropharynx as per pre-operative assessment

## 2024-02-11 NOTE — Anesthesia Preprocedure Evaluation (Signed)
 "                                  Anesthesia Evaluation  Patient identified by MRN, date of birth, ID band Patient awake    Reviewed: Allergy & Precautions, NPO status , Patient's Chart, lab work & pertinent test results  Airway Mallampati: II  TM Distance: >3 FB Neck ROM: Full    Dental  (+) Lower Dentures, Upper Dentures   Pulmonary neg pulmonary ROS   Pulmonary exam normal        Cardiovascular Exercise Tolerance: Good hypertension, Pt. on medications negative cardio ROS Normal cardiovascular exam Rhythm:Regular Rate:Normal     Neuro/Psych negative neurological ROS  negative psych ROS   GI/Hepatic negative GI ROS, Neg liver ROS,,,  Endo/Other  negative endocrine ROS  Class 3 obesity  Renal/GU negative Renal ROS  negative genitourinary   Musculoskeletal  (+) Arthritis ,    Abdominal  (+) + obese  Peds negative pediatric ROS (+)  Hematology negative hematology ROS (+) Blood dyscrasia, anemia   Anesthesia Other Findings Past Medical History: No date: Anemia     Comment:  not currently being treated for this No date: Arthritis No date: History of colon polyps No date: Hypertension  Past Surgical History: 1989: CHOLECYSTECTOMY 04/10/2017: COLONOSCOPY WITH PROPOFOL ; N/A     Comment:  Procedure: COLONOSCOPY WITH PROPOFOL ;  Surgeon: Unk Corinn Skiff, MD;  Location: [facility] ENDOSCOPY;  Service:               Gastroenterology;  Laterality: N/A; 04/10/2017: ESOPHAGOGASTRODUODENOSCOPY (EGD) WITH PROPOFOL ; N/A     Comment:  Procedure: ESOPHAGOGASTRODUODENOSCOPY (EGD) WITH               PROPOFOL ;  Surgeon: Unk Corinn Skiff, MD;  Location:               [facility] ENDOSCOPY;  Service: Gastroenterology;  Laterality:               N/A; 03/21/03: GASTRIC BYPASS 04/2014: JOINT REPLACEMENT; Right     Comment:  TOTAL KNEE REPLACEMENT, DR. KATHLYNN, [facility] 02/27/2016: TOTAL KNEE ARTHROPLASTY; Left     Comment:  Procedure: TOTAL KNEE ARTHROPLASTY;   Surgeon: Ozell Kathlynn, MD;  Location: [facility] ORS;  Service: Orthopedics;                Laterality: Left; 1976: TUBAL LIGATION  BMI    Body Mass Index: 36.56 kg/m      Reproductive/Obstetrics negative OB ROS                              Anesthesia Physical Anesthesia Plan  ASA: 3  Anesthesia Plan: General   Post-op Pain Management:    Induction: Intravenous  PONV Risk Score and Plan: Propofol  infusion and TIVA  Airway Management Planned: Natural Airway and Nasal Cannula  Additional Equipment:   Intra-op Plan:   Post-operative Plan:   Informed Consent: I have reviewed the patients History and Physical, chart, labs and discussed the procedure including the risks, benefits and alternatives for the proposed anesthesia with the patient or authorized representative who has indicated his/her understanding and acceptance.     Dental Advisory Given  Plan Discussed with: CRNA  Anesthesia Plan Comments:         Anesthesia Quick Evaluation  "

## 2024-02-12 LAB — SURGICAL PATHOLOGY

## 2024-02-13 ENCOUNTER — Ambulatory Visit: Payer: Self-pay | Admitting: Gastroenterology

## 2024-03-01 ENCOUNTER — Inpatient Hospital Stay: Attending: Oncology

## 2024-03-29 ENCOUNTER — Inpatient Hospital Stay: Attending: Oncology

## 2024-04-29 ENCOUNTER — Other Ambulatory Visit

## 2024-04-29 ENCOUNTER — Inpatient Hospital Stay: Attending: Oncology

## 2024-05-25 ENCOUNTER — Ambulatory Visit: Admitting: Internal Medicine

## 2024-05-28 ENCOUNTER — Inpatient Hospital Stay: Attending: Oncology

## 2024-06-28 ENCOUNTER — Inpatient Hospital Stay: Attending: Oncology

## 2024-07-28 ENCOUNTER — Inpatient Hospital Stay: Attending: Oncology

## 2024-08-27 ENCOUNTER — Inpatient Hospital Stay: Attending: Oncology

## 2024-09-28 ENCOUNTER — Inpatient Hospital Stay: Attending: Oncology

## 2024-10-27 ENCOUNTER — Other Ambulatory Visit

## 2024-10-27 ENCOUNTER — Inpatient Hospital Stay: Attending: Oncology

## 2024-10-27 ENCOUNTER — Ambulatory Visit: Admitting: Oncology

## 2024-10-28 ENCOUNTER — Inpatient Hospital Stay

## 2024-11-23 ENCOUNTER — Ambulatory Visit
